# Patient Record
Sex: Female | Born: 1950 | Race: White | Hispanic: No | Marital: Married | Smoking: Former smoker
Health system: Southern US, Community
[De-identification: ages and names within clinical notes are randomized; demographics above are authoritative.]

## PROBLEM LIST (undated history)

## (undated) DIAGNOSIS — M8580 Other specified disorders of bone density and structure, unspecified site: Secondary | ICD-10-CM

## (undated) DIAGNOSIS — I10 Essential (primary) hypertension: Secondary | ICD-10-CM

## (undated) DIAGNOSIS — Z86010 Personal history of colonic polyps: Secondary | ICD-10-CM

## (undated) DIAGNOSIS — F419 Anxiety disorder, unspecified: Secondary | ICD-10-CM

## (undated) DIAGNOSIS — K6289 Other specified diseases of anus and rectum: Secondary | ICD-10-CM

## (undated) DIAGNOSIS — Z87442 Personal history of urinary calculi: Secondary | ICD-10-CM

## (undated) DIAGNOSIS — G4730 Sleep apnea, unspecified: Secondary | ICD-10-CM

## (undated) DIAGNOSIS — Z9989 Dependence on other enabling machines and devices: Secondary | ICD-10-CM

## (undated) DIAGNOSIS — G4719 Other hypersomnia: Secondary | ICD-10-CM

## (undated) DIAGNOSIS — G2581 Restless legs syndrome: Secondary | ICD-10-CM

## (undated) DIAGNOSIS — R011 Cardiac murmur, unspecified: Secondary | ICD-10-CM

## (undated) DIAGNOSIS — G4733 Obstructive sleep apnea (adult) (pediatric): Secondary | ICD-10-CM

## (undated) DIAGNOSIS — Z8659 Personal history of other mental and behavioral disorders: Secondary | ICD-10-CM

## (undated) HISTORY — DX: Essential (primary) hypertension: I10

## (undated) HISTORY — DX: Other hypersomnia: G47.19

## (undated) HISTORY — DX: Personal history of colonic polyps: Z86.010

## (undated) HISTORY — DX: Personal history of urinary calculi: Z87.442

## (undated) HISTORY — PX: OTHER SURGICAL HISTORY: SHX169

## (undated) HISTORY — DX: Other specified diseases of anus and rectum: K62.89

## (undated) HISTORY — PX: TONSILLECTOMY AND ADENOIDECTOMY: SHX28

## (undated) HISTORY — DX: Sleep apnea, unspecified: G47.30

---

## 2000-09-22 ENCOUNTER — Other Ambulatory Visit: Admission: RE | Admit: 2000-09-22 | Discharge: 2000-09-22 | Payer: Self-pay | Admitting: Obstetrics and Gynecology

## 2001-04-20 ENCOUNTER — Encounter: Payer: Self-pay | Admitting: Obstetrics and Gynecology

## 2001-04-20 ENCOUNTER — Ambulatory Visit (HOSPITAL_COMMUNITY): Admission: RE | Admit: 2001-04-20 | Discharge: 2001-04-20 | Payer: Self-pay | Admitting: Obstetrics and Gynecology

## 2001-05-18 ENCOUNTER — Encounter: Payer: Self-pay | Admitting: Obstetrics and Gynecology

## 2001-05-18 ENCOUNTER — Encounter: Admission: RE | Admit: 2001-05-18 | Discharge: 2001-05-18 | Payer: Self-pay | Admitting: Obstetrics and Gynecology

## 2001-09-23 ENCOUNTER — Other Ambulatory Visit: Admission: RE | Admit: 2001-09-23 | Discharge: 2001-09-23 | Payer: Self-pay | Admitting: Obstetrics and Gynecology

## 2001-12-02 ENCOUNTER — Encounter: Admission: RE | Admit: 2001-12-02 | Discharge: 2001-12-02 | Payer: Self-pay | Admitting: Urology

## 2001-12-02 ENCOUNTER — Encounter: Payer: Self-pay | Admitting: Urology

## 2001-12-12 ENCOUNTER — Encounter: Payer: Self-pay | Admitting: Urology

## 2001-12-12 ENCOUNTER — Encounter: Admission: RE | Admit: 2001-12-12 | Discharge: 2001-12-12 | Payer: Self-pay | Admitting: Urology

## 2001-12-13 ENCOUNTER — Ambulatory Visit (HOSPITAL_BASED_OUTPATIENT_CLINIC_OR_DEPARTMENT_OTHER): Admission: RE | Admit: 2001-12-13 | Discharge: 2001-12-13 | Payer: Self-pay | Admitting: Urology

## 2001-12-13 HISTORY — PX: OTHER SURGICAL HISTORY: SHX169

## 2002-11-08 ENCOUNTER — Ambulatory Visit (HOSPITAL_COMMUNITY): Admission: RE | Admit: 2002-11-08 | Discharge: 2002-11-08 | Payer: Self-pay | Admitting: Obstetrics and Gynecology

## 2002-11-08 ENCOUNTER — Encounter: Payer: Self-pay | Admitting: Obstetrics and Gynecology

## 2002-12-18 ENCOUNTER — Other Ambulatory Visit: Admission: RE | Admit: 2002-12-18 | Discharge: 2002-12-18 | Payer: Self-pay | Admitting: Obstetrics and Gynecology

## 2003-12-19 ENCOUNTER — Other Ambulatory Visit: Admission: RE | Admit: 2003-12-19 | Discharge: 2003-12-19 | Payer: Self-pay | Admitting: Obstetrics and Gynecology

## 2004-05-20 ENCOUNTER — Ambulatory Visit: Payer: Self-pay | Admitting: Internal Medicine

## 2004-08-14 ENCOUNTER — Ambulatory Visit: Payer: Self-pay | Admitting: Internal Medicine

## 2004-08-15 ENCOUNTER — Ambulatory Visit (HOSPITAL_COMMUNITY): Admission: RE | Admit: 2004-08-15 | Discharge: 2004-08-15 | Payer: Self-pay | Admitting: Obstetrics and Gynecology

## 2004-12-04 ENCOUNTER — Ambulatory Visit: Payer: Self-pay | Admitting: Internal Medicine

## 2005-01-07 ENCOUNTER — Ambulatory Visit: Payer: Self-pay | Admitting: Internal Medicine

## 2005-01-19 ENCOUNTER — Ambulatory Visit: Payer: Self-pay | Admitting: Internal Medicine

## 2005-02-04 ENCOUNTER — Ambulatory Visit: Payer: Self-pay | Admitting: Internal Medicine

## 2005-02-17 ENCOUNTER — Other Ambulatory Visit: Admission: RE | Admit: 2005-02-17 | Discharge: 2005-02-17 | Payer: Self-pay | Admitting: Obstetrics and Gynecology

## 2005-06-16 ENCOUNTER — Ambulatory Visit: Payer: Self-pay | Admitting: Internal Medicine

## 2005-09-29 ENCOUNTER — Ambulatory Visit: Payer: Self-pay | Admitting: Internal Medicine

## 2005-10-02 ENCOUNTER — Ambulatory Visit: Payer: Self-pay | Admitting: Internal Medicine

## 2005-10-26 ENCOUNTER — Ambulatory Visit (HOSPITAL_COMMUNITY): Admission: RE | Admit: 2005-10-26 | Discharge: 2005-10-26 | Payer: Self-pay | Admitting: Internal Medicine

## 2006-03-02 ENCOUNTER — Ambulatory Visit: Payer: Self-pay | Admitting: Internal Medicine

## 2006-03-09 ENCOUNTER — Ambulatory Visit: Payer: Self-pay | Admitting: Internal Medicine

## 2006-03-09 ENCOUNTER — Encounter: Payer: Self-pay | Admitting: Internal Medicine

## 2006-03-09 HISTORY — PX: COLONOSCOPY: SHX174

## 2006-03-12 ENCOUNTER — Other Ambulatory Visit: Admission: RE | Admit: 2006-03-12 | Discharge: 2006-03-12 | Payer: Self-pay | Admitting: Obstetrics and Gynecology

## 2006-08-23 ENCOUNTER — Ambulatory Visit (HOSPITAL_COMMUNITY): Admission: RE | Admit: 2006-08-23 | Discharge: 2006-08-23 | Payer: Self-pay | Admitting: Neurology

## 2006-12-09 ENCOUNTER — Encounter: Payer: Self-pay | Admitting: Internal Medicine

## 2006-12-21 ENCOUNTER — Ambulatory Visit: Payer: Self-pay | Admitting: Family Medicine

## 2007-04-19 ENCOUNTER — Ambulatory Visit (HOSPITAL_COMMUNITY): Admission: RE | Admit: 2007-04-19 | Discharge: 2007-04-19 | Payer: Self-pay | Admitting: Obstetrics and Gynecology

## 2007-11-14 LAB — CONVERTED CEMR LAB: Pap Smear: NORMAL

## 2007-11-29 ENCOUNTER — Other Ambulatory Visit: Admission: RE | Admit: 2007-11-29 | Discharge: 2007-11-29 | Payer: Self-pay | Admitting: Obstetrics and Gynecology

## 2008-04-24 ENCOUNTER — Ambulatory Visit (HOSPITAL_COMMUNITY): Admission: RE | Admit: 2008-04-24 | Discharge: 2008-04-24 | Payer: Self-pay | Admitting: Obstetrics and Gynecology

## 2008-08-21 ENCOUNTER — Ambulatory Visit: Payer: Self-pay | Admitting: Internal Medicine

## 2008-08-21 LAB — CONVERTED CEMR LAB
ALT: 18 units/L (ref 0–35)
Albumin: 3.8 g/dL (ref 3.5–5.2)
Bilirubin Urine: NEGATIVE
Blood in Urine, dipstick: NEGATIVE
Calcium: 9 mg/dL (ref 8.4–10.5)
Chloride: 109 meq/L (ref 96–112)
Cholesterol: 216 mg/dL (ref 0–200)
GFR calc Af Amer: 111 mL/min
Glucose, Bld: 94 mg/dL (ref 70–99)
HDL: 62.4 mg/dL (ref >39.0)
Lymphocytes Relative: 19.3 % (ref 12.0–46.0)
Monocytes Relative: 7.5 % (ref 3.0–12.0)
Neutro Abs: 3.9 10*3/uL (ref 1.4–7.7)
Nitrite: NEGATIVE
Platelets: 164 10*3/uL (ref 150–400)
RBC: 4.34 M/uL (ref 3.87–5.11)
TSH: 0.88 microintl units/mL (ref 0.35–5.50)
Total Bilirubin: 0.7 mg/dL (ref 0.3–1.2)
Total CHOL/HDL Ratio: 3.5
Urobilinogen, UA: 0.2

## 2008-08-28 ENCOUNTER — Ambulatory Visit: Payer: Self-pay | Admitting: Internal Medicine

## 2008-08-28 DIAGNOSIS — G2581 Restless legs syndrome: Secondary | ICD-10-CM | POA: Insufficient documentation

## 2008-08-28 DIAGNOSIS — Z87442 Personal history of urinary calculi: Secondary | ICD-10-CM | POA: Insufficient documentation

## 2008-08-28 DIAGNOSIS — E669 Obesity, unspecified: Secondary | ICD-10-CM | POA: Insufficient documentation

## 2009-01-28 ENCOUNTER — Encounter: Payer: Self-pay | Admitting: Obstetrics and Gynecology

## 2009-01-28 ENCOUNTER — Ambulatory Visit (HOSPITAL_COMMUNITY): Admission: RE | Admit: 2009-01-28 | Discharge: 2009-01-28 | Payer: Self-pay | Admitting: Obstetrics and Gynecology

## 2009-01-28 HISTORY — PX: OTHER SURGICAL HISTORY: SHX169

## 2009-06-11 ENCOUNTER — Ambulatory Visit: Payer: Self-pay | Admitting: Family Medicine

## 2009-06-11 DIAGNOSIS — N390 Urinary tract infection, site not specified: Secondary | ICD-10-CM | POA: Insufficient documentation

## 2009-06-11 LAB — CONVERTED CEMR LAB
Bilirubin Urine: NEGATIVE
Blood in Urine, dipstick: NEGATIVE
Ketones, urine, test strip: NEGATIVE
Nitrite: NEGATIVE
Protein, U semiquant: NEGATIVE
Specific Gravity, Urine: <1.005
WBC Urine, dipstick: NEGATIVE

## 2009-06-12 ENCOUNTER — Encounter: Payer: Self-pay | Admitting: Family Medicine

## 2009-07-26 ENCOUNTER — Encounter: Payer: Self-pay | Admitting: Internal Medicine

## 2009-08-30 ENCOUNTER — Ambulatory Visit: Payer: Self-pay | Admitting: Internal Medicine

## 2009-08-30 DIAGNOSIS — Z8679 Personal history of other diseases of the circulatory system: Secondary | ICD-10-CM | POA: Insufficient documentation

## 2009-08-30 DIAGNOSIS — R0602 Shortness of breath: Secondary | ICD-10-CM | POA: Insufficient documentation

## 2009-09-16 ENCOUNTER — Ambulatory Visit: Payer: Self-pay

## 2009-09-16 ENCOUNTER — Encounter: Payer: Self-pay | Admitting: Internal Medicine

## 2009-09-16 ENCOUNTER — Ambulatory Visit (HOSPITAL_COMMUNITY): Admission: RE | Admit: 2009-09-16 | Discharge: 2009-09-16 | Payer: Self-pay | Admitting: Internal Medicine

## 2009-09-16 ENCOUNTER — Ambulatory Visit: Payer: Self-pay | Admitting: Internal Medicine

## 2009-09-16 HISTORY — PX: TRANSTHORACIC ECHOCARDIOGRAM: SHX275

## 2009-09-18 ENCOUNTER — Ambulatory Visit: Payer: Self-pay | Admitting: Internal Medicine

## 2009-09-18 ENCOUNTER — Ambulatory Visit: Payer: Self-pay | Admitting: Cardiovascular Disease

## 2009-09-18 ENCOUNTER — Telehealth (INDEPENDENT_AMBULATORY_CARE_PROVIDER_SITE_OTHER): Payer: Self-pay | Admitting: *Deleted

## 2009-09-19 ENCOUNTER — Encounter (HOSPITAL_COMMUNITY): Admission: RE | Admit: 2009-09-19 | Discharge: 2009-11-14 | Payer: Self-pay | Admitting: Cardiovascular Disease

## 2009-09-19 ENCOUNTER — Ambulatory Visit: Payer: Self-pay

## 2009-09-19 ENCOUNTER — Ambulatory Visit: Payer: Self-pay | Admitting: Cardiology

## 2009-09-19 HISTORY — PX: CARDIOVASCULAR STRESS TEST: SHX262

## 2009-09-23 ENCOUNTER — Ambulatory Visit (HOSPITAL_COMMUNITY): Admission: RE | Admit: 2009-09-23 | Discharge: 2009-09-23 | Payer: Self-pay | Admitting: Obstetrics and Gynecology

## 2009-09-23 HISTORY — PX: OTHER SURGICAL HISTORY: SHX169

## 2009-09-23 LAB — CONVERTED CEMR LAB
ALT: 18 units/L (ref 0–35)
Basophils Relative: 0.4 % (ref 0.0–3.0)
CO2: 26 meq/L (ref 19–32)
Chloride: 111 meq/L (ref 96–112)
Creatinine, Ser: 0.8 mg/dL (ref 0.4–1.2)
Direct LDL: 138.8 mg/dL
Eosinophils Absolute: 0.2 10*3/uL (ref 0.0–0.7)
GFR calc non Af Amer: 78.08 mL/min (ref >60)
Glucose, Bld: 102 mg/dL — ABNORMAL HIGH (ref 70–99)
HCT: 42.1 % (ref 36.0–46.0)
Lymphocytes Relative: 17.9 % (ref 12.0–46.0)
Lymphs Abs: 1.1 10*3/uL (ref 0.7–4.0)
MCHC: 34.7 g/dL (ref 30.0–36.0)
MCV: 94.1 fL (ref 78.0–100.0)
Monocytes Absolute: 0.5 10*3/uL (ref 0.1–1.0)
Platelets: 176 10*3/uL (ref 150.0–400.0)
Sodium: 142 meq/L (ref 135–145)
TSH: 1.14 microintl units/mL (ref 0.35–5.50)
Total Protein: 6.8 g/dL (ref 6.0–8.3)

## 2009-12-20 ENCOUNTER — Ambulatory Visit: Payer: Self-pay | Admitting: Internal Medicine

## 2009-12-20 DIAGNOSIS — R490 Dysphonia: Secondary | ICD-10-CM | POA: Insufficient documentation

## 2009-12-31 ENCOUNTER — Encounter: Payer: Self-pay | Admitting: Internal Medicine

## 2010-01-01 ENCOUNTER — Ambulatory Visit (HOSPITAL_COMMUNITY): Admission: RE | Admit: 2010-01-01 | Discharge: 2010-01-01 | Payer: Self-pay | Admitting: Obstetrics and Gynecology

## 2010-01-06 ENCOUNTER — Encounter: Admission: RE | Admit: 2010-01-06 | Discharge: 2010-01-06 | Payer: Self-pay | Admitting: Obstetrics and Gynecology

## 2010-05-06 ENCOUNTER — Encounter: Payer: Self-pay | Admitting: Internal Medicine

## 2010-06-12 ENCOUNTER — Encounter
Admission: RE | Admit: 2010-06-12 | Discharge: 2010-06-12 | Payer: Self-pay | Source: Home / Self Care | Attending: Otolaryngology | Admitting: Otolaryngology

## 2010-07-15 NOTE — Consult Note (Signed)
Summary: [facility]  [facility]   Imported By: Maryln Gottron 01/07/2010 15:53:08  _____________________________________________________________________  External Attachment:    Type:   Image     Comment:   External Document

## 2010-07-15 NOTE — Assessment & Plan Note (Signed)
Summary: np3/DOE/jml   Primary Provider:  Dr. Fabian Sharp  CC:  referal from Dr Fabian Sharp due to sob.  History of Present Illness: Amy Burnett is seen today at the request of Dr Fabian Sharp and Romine.  She is to have uterine polyp surgery on Monday and needs clearance.  She has had progressive exertinal dyspnea over th last year.  I reviewed her recent echo and LV/RV function normal She has a history of MVP but there was no significant prolapse on echol  He dyspnea is exertional, and persistant with some diaphoresis.  She quit smoking over 20 years ago and has no chronic lung disease.  She denies cough, fever, sputum.  She has had a little LE edema.  Her weight has gone up a lot over the last year.  She has not had a previous stress test but had a calcium score of 0 done in [place] in 2001.  Her ECG is abnormal with nonspecific ST/T wave changes and poor R wave progression  Current Problems (verified): 1)  Mitral Valve Prolapse, Hx of  (ICD-V12.50) 2)  Shortness of Breath  (ICD-786.05) 3)  Uti  (ICD-599.0) 4)  Restless Leg Syndrome  (ICD-333.94) 5)  Obesity, Unspecified  (ICD-278.00) 6)  Nephrolithiasis, Hx of  (ICD-V13.01) 7)  Routine Gynecological Exam  (ICD-V72.31) 8)  Routine General Medical Exam@[facility]  (ICD-V70.0) 9)  Family History Breast Cancer 1st Degree Relative <50  (ICD-V16.3)  Current Medications (verified): 1)  Requip 1 Mg Tabs (Ropinirole Hcl) .Marland Kitchen.. 1 By Mouth Qid 2)  Zoloft 100 Mg  Tabs (Sertraline Hcl) .... 2 By Mouth Once Daily 3)  Prometrium 100 Mg Caps (Progesterone Micronized) .Marland Kitchen.. 1 By Mouth Once Daily 4)  Alprazolam 0.5 Mg Tabs (Alprazolam) .Marland Kitchen.. 1 By Mouth As Needed 5)  Estradiol 0.05 Mg/24hr Ptwk (Estradiol) .... As Directed 6)  Simply Sleep .... At Bedtime 7)  Vitamin D (Ergocalciferol) 50000 Unit Caps (Ergocalciferol) .... Once Weekly  Allergies (verified): No Known Drug Allergies  Past History:  Past Medical History: Last updated: 09/16/2009 Current  Problems:  MITRAL VALVE PROLAPSE, HX OF (ICD-V12.50) trivial MR by echo 2011 SHORTNESS OF BREATH (ICD-786.05) UTI (ICD-599.0) RESTLESS LEG SYNDROME (ICD-333.94) OBESITY, UNSPECIFIED (ICD-278.00) NEPHROLITHIASIS, HX OF (ICD-V13.01), hx of calcium stone  ROUTINE GYNECOLOGICAL EXAM (ICD-V72.31) ROUTINE GENERAL MEDICAL EXAM@[facility]ARE [facility] (ICD-V70.0) FAMILY HISTORY BREAST CANCER 1ST DEGREE RELATIVE <50 (ICD-V16.3)  MVP Panic Disorders  RLS  Past Surgical History: Last updated: 08/30/2009 Colonoscopy Cystoscopy  2010  Family History: Last updated: 08/30/2009 Family History Breast cancer 1st degree relative <50 Family History of Cardiovascular disorder Father: died Hd and alcohol   RHD    age 28s  Mother: Died age 37 pancreatic cancer   had breast cancer also when 31s  Siblings: 1 sister  good health . neg for SCD.   Social History: Last updated: 08/28/2008 Married Former Smoker Alcohol use-yes Psychologist, sport and exercise  . Sleep  7 hours  HH of    2 +  Pets 2 cats    Review of Systems       Denies fever, malais, weight loss, blurry vision, decreased visual acuity, cough, sputum, , hemoptysis, pleuritic pain, palpitaitons, heartburn, abdominal pain, melena, lower extremity edema, claudication, or rash.   Vital Signs:  Patient profile:   60 year old female Menstrual status:  irregular Height:      66 inches Weight:      221 pounds BMI:     35.80 Pulse rate:   80 / minute Resp:  14 per minute BP sitting:   118 / 90  (left arm)  Vitals Entered By: Kem Parkinson (September 18, 2009 2:17 PM)  Physical Exam  General:  Affect appropriate Healthy:  appears stated age HEENT: normal Neck supple with no adenopathy JVP normal no bruits no thyromegaly Lungs clear with no wheezing and good diaphragmatic motion Heart:  S1/S2 no murmur,rub, gallop or click PMI normal Abdomen: benighn, BS positve, no tenderness, no AAA no bruit.  No HSM or HJR Distal pulses intact with no  bruits No edema Neuro non-focal Skin warm and dry    Impression & Recommendations:  Problem # 1:  MITRAL VALVE PROLAPSE, HX OF (ICD-V12.50) No significant prolapse in echo  Problem # 2:  SHORTNESS OF BREATH (ICD-786.05) Echo normal.  ETT myovue to R/O anginal equivalent and clear for ObGyn surgery Orders: Nuclear Stress Test (Nuc Stress Test)  Problem # 3:  OBESITY, UNSPECIFIED (ICD-278.00) If ETT normal clear to start exercise program.  Discussed low carb diet  Patient Instructions: 1)  Your physician recommends that you schedule a follow-up appointment in: AS NEEDED PENDING TEST RESULTS 2)  Your physician has requested that you have an exercise stress myoview.  For further information please visit [email or web address]  Please follow instruction sheet, as given.   EKG Report  Procedure date:  08/30/2009  Findings:      NSR 79 Poor R wave progression Nonspecific ST/T wave changes

## 2010-07-15 NOTE — Letter (Signed)
Summary: [facility] neg cysto   [facility]   Imported By: Maryln Gottron 08/02/2009 14:56:17  _____________________________________________________________________  External Attachment:    Type:   Image     Comment:   External Document

## 2010-07-15 NOTE — Progress Notes (Signed)
Summary: Nuclear Pre-Procedure  Phone Note Outgoing Call   Action Taken: Appt scheduled Summary of Call: Reviewed information on Myoview Information Sheet while scheduling appointmet(see scanned document for further details).  Spoke with Patient    Nuclear Med Background Indications for Stress Test: Evaluation for Ischemia, Surgical Clearance, Abnormal EKG  Indications Comments: Pending uterine polyp surgery (09/23/09)  History: Echo, MVP  History Comments: 4/11 Echo: EF=60-65%  Symptoms: Diaphoresis, DOE, SOB    Nuclear Pre-Procedure Cardiac Risk Factors: Family History - CAD, History of Smoking Height (in): 66

## 2010-07-15 NOTE — Assessment & Plan Note (Signed)
Summary: voice change/dm   Vital Signs:  Patient profile:   60 year old female Menstrual status:  irregular Height:      66.5 inches (168.91 cm) Weight:      225.31 pounds (102.41 kg) O2 Sat:      94 % on Room air Temp:     98.3 degrees F (36.83 degrees C) oral Pulse rate:   87 / minute BP sitting:   118 / 76  (left arm) Cuff size:   large  Vitals Entered By: Josph Macho RMA (December 20, 2009 8:16 AM)  O2 Flow:  Room air CC: voice change X1 month- pt states she feels like she has a sore throat sometimes/ CF Is Patient Diabetic? No   History of Present Illness: Amy Burnett comes in today  for NDA appt for above problem . Month long problem  ? from talking a lot.  Feels like has to strain a bit and then off.  weakness of voice is described .   no fever , allergies  and no gerd signs . Does have nose congestion . ocass ST.   No aggravator or mitigators .    uses neosynephrine at night .    for nasal stuffiness for a while.    No coughing or allergy dx .   no dysphagia.  Preventive Screening-Counseling & Management  Alcohol-Tobacco     Alcohol drinks/day: 2     Alcohol type: wine     Smoking Status: quit     Year Quit: 1987  Caffeine-Diet-Exercise     Caffeine use/day: 4     Does Patient Exercise: no     Exercise (avg: min/session): 6:30  Current Medications (verified): 1)  Requip 1 Mg Tabs (Ropinirole Hcl) .Marland Kitchen.. 1 By Mouth Qid 2)  Zoloft 100 Mg  Tabs (Sertraline Hcl) .... 2 By Mouth Once Daily 3)  Prometrium 100 Mg Caps (Progesterone Micronized) .Marland Kitchen.. 1 By Mouth Once Daily 4)  Alprazolam 0.5 Mg Tabs (Alprazolam) .Marland Kitchen.. 1 By Mouth As Needed 5)  Estradiol 0.05 Mg/24hr Ptwk (Estradiol) .... As Directed 6)  Simply Sleep .... At Bedtime 7)  Vitamin D (Ergocalciferol) 50000 Unit Caps (Ergocalciferol) .... Once Weekly  Allergies (verified): No Known Drug Allergies  Past History:  Past medical, surgical, family and social histories (including risk factors) reviewed, and no  changes noted (except as noted below).  Past Medical History: Current Problems:  MITRAL VALVE PROLAPSE, HX OF (ICD-V12.50) trivial MR by echo 2011   SHORTNESS OF BREATH (ICD-786.05) UTI (ICD-599.0) RESTLESS LEG SYNDROME (ICD-333.94) OBESITY, UNSPECIFIED (ICD-278.00) NEPHROLITHIASIS, HX OF (ICD-V13.01), hx of calcium stone  stress echo neg . MVP Panic Disorders  RLS  Past Surgical History: Colonoscopy Cystoscopy  2010 Gyne surgery   Past History:  Care Management: Gynecology: Dr. Tresa Res Podiatry: Dr. Stacie Acres Neurology: Dr. Vickey Huger  RLS   Gastroenterology: Leone Payor  Urology 2011    Family History: Reviewed history from 08/30/2009 and no changes required. Family History Breast cancer 1st degree relative <50 Family History of Cardiovascular disorder Father: died Hd and alcohol   RHD    age 58s  Mother: Died age 37 pancreatic cancer   had breast cancer also when 53s  Siblings: 1 sister  good health . neg for SCD.   Social History: Reviewed history from 08/28/2008 and no changes required. 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    Physical Exam  General:  alert, well-developed, and well-nourished.   Head:  normocephalic and atraumatic.   Eyes:  vision grossly intact, pupils equal, and pupils round.   Ears:  R ear normal, L ear normal, and no external deformities.   Nose:  no external deformity, no external erythema, and no nasal discharge.   Mouth:  pharynx pink and moist.   Neck:  No deformities, masses, or tenderness noted. Lungs:  Normal respiratory  effort, chest expands symmetrically. Lungs are clear to auscultation, no crackles or wheezes. Heart:  normal rate, regular rhythm, and no murmur.   Pulses:  pulses intact without delay   Extremities:  no clubbing cyanosis or edema  Neurologic:  non focal  Skin:  turgor normal, color normal, no ecchymoses, and no petechiae.   Cervical Nodes:  No lymphadenopathy noted Psych:  Oriented X3, normally interactive, good eye contact, not anxious appearing, and not depressed appearing.     Impression & Recommendations:  Problem # 1:  HOARSENESS (ZOX-096.04)  voice change   for over a month with neg    ros  ? cause  ... diff dx explained  .    need ent check of airway etc.    Orders: ENT Referral (ENT)  Complete Medication List: 1)  Requip 1 Mg Tabs (Ropinirole hcl) .Marland Kitchen.. 1 by mouth qid 2)  Zoloft 100 Mg Tabs (Sertraline hcl) .... 2 by mouth once daily 3)  Prometrium 100 Mg Caps (Progesterone micronized) .Marland Kitchen.. 1 by mouth once daily 4)  Alprazolam 0.5 Mg Tabs (Alprazolam) .Marland Kitchen.. 1 by mouth as needed 5)  Estradiol 0.05 Mg/24hr Ptwk (Estradiol) .... As directed 6)  Simply Sleep  .... At bedtime 7)  Vitamin D (ergocalciferol) 50000 Unit Caps (Ergocalciferol) .... Once weekly  Patient Instructions: 1)  Will contact you about ENT  2)  IN the meantime can try OTC claritin or allegra or zyrtec  to see if makes a different.  3)  This could be silent reflux also.

## 2010-07-15 NOTE — Assessment & Plan Note (Signed)
Summary: Cardiology Nuclear Study  Nuclear Med Background Indications for Stress Test: Evaluation for Ischemia, Surgical Clearance, Abnormal EKG  Indications Comments: Pending uterine polyp surgery (09/23/09)  History: Echo, MVP  History Comments: 4/11 Echo: EF=60-65%  Symptoms: Diaphoresis, DOE, SOB    Nuclear Pre-Procedure Cardiac Risk Factors: Family History - CAD, History of Smoking Caffeine/Decaff Intake: none NPO After: 8:30 PM Lungs: clear IV 0.9% NS with Angio Cath: 20g     IV Site: (R) AC IV Started by: Stanton Kidney EMT-P Chest Size (in) 40     Cup Size DDD     Height (in): 66.5 Weight (lb): 220 BMI: 35.10  Nuclear Med Study 1 or 2 day study:  1 day     Stress Test Type:  Stress Reading MD:  Marca Ancona, MD     Referring MD:  P.Nishan Resting Radionuclide:  Technetium 3m Tetrofosmin     Resting Radionuclide Dose:  11 mCi  Stress Radionuclide:  Technetium 22m Tetrofosmin     Stress Radionuclide Dose:  33 mCi   Stress Protocol Exercise Time (min):  6:30 min     Max HR:  150 bpm     Predicted Max HR:  162 bpm  Max Systolic BP: 141 mm Hg     Percent Max HR:  92.59 %     METS: 7.7 Rate Pressure Product:  16109    Stress Test Technologist:  Milana Na EMT-P     Nuclear Technologist:  Domenic Polite CNMT  Rest Procedure  Myocardial perfusion imaging was performed at rest 45 minutes following the intravenous administration of Myoview Technetium 34m Tetrofosmin.  Stress Procedure  The patient exercised for 6:30. The patient stopped due to fatigue and denied any chest pain.  There were no significant ST-T wave changes.  Myoview was injected at peak exercise and myocardial perfusion imaging was performed after a brief delay.  QPS Raw Data Images:  Normal; no motion artifact; normal heart/lung ratio. Stress Images:  NI: Uniform and normal uptake of tracer in all myocardial segments. Rest Images:  Normal homogeneous uptake in all areas of the  myocardium. Subtraction (SDS):  There is no evidence of scar or ischemia. Transient Ischemic Dilatation:  1.13  (Normal <1.22)  Lung/Heart Ratio:  .39  (Normal <0.45)  Quantitative Gated Spect Images QGS EDV:  67 ml QGS ESV:  13 ml QGS EF:  80 % QGS cine images:  Normal wall motion.    Overall Impression  Exercise Capacity: Fair exercise capacity. BP Response: Normal blood pressure response. Clinical Symptoms: Stopped due to fatigue.  ECG Impression: No significant ST segment change suggestive of ischemia. Overall Impression: Normal stress nuclear study.  Appended Document: Cardiology Nuclear Study normal nuclear.  Ok for Ob/Gyn surgery  Appended Document: Cardiology Nuclear Study pt aware of results

## 2010-07-15 NOTE — Assessment & Plan Note (Signed)
Summary: RESP CONCERNS (PT ADV MAY BE STRESS / FATIGUE?) // RS   Vital Signs:  Patient profile:   60 year old female Menstrual status:  irregular Height:      66 inches Weight:      221 pounds BMI:     35.80 O2 Sat:      95 % Temp:     98 degrees F oral Pulse rate:   81 / minute Pulse rhythm:   regular Resp:     16 per minute BP sitting:   106 / 66  Vitals Entered By: Lynann Beaver CMA (August 30, 2009 11:41 AM) CC: Pt is complaining of SOB with exertion Is Patient Diabetic? No Pain Assessment Patient in pain? no        History of Present Illness: Amy Burnett comes  in today for    acute visit.  She is concerned  because of the   insidious onset of" windednesss when does things".  Seems deconditioning  but unsure if could have other problems.   Worried about heart disease. Began walking and want to make sure she is ok.  Was hard to talk  when walking with sister.   But had no chest pain.   No leg swelling or redness . Could have anxiety  about this in the mix. Would like to start exercising more but has some concern about this. Since her last visit here  Has had fibroid removed in August.   and to have polyp  to be taken out.   soon.  She saw urology for symptom that were not from infection and has had a neg eval so far . Is trying Detrol.  Preventive Screening-Counseling & Management  Alcohol-Tobacco     Alcohol drinks/day: 2     Alcohol type: wine     Smoking Status: quit     Year Quit: 1987  Caffeine-Diet-Exercise     Caffeine use/day: 4     Does Patient Exercise: no  Allergies: No Known Drug Allergies  Past History:  Past medical, surgical, family and social histories (including risk factors) reviewed, and no changes noted (except as noted below).  Past Medical History: MVP Panic Disorders Nephrolithiasis, hx of calcium stone  RLS  Past Surgical History: Colonoscopy Cystoscopy  2010  Past History:  Care Management: Gynecology: Dr.  Tresa Res Podiatry: Dr. Stacie Acres Neurology: Dr. Vickey Huger  RLS   Gastroenterology: Leone Payor  Urology 2011  Family History: Reviewed history from 08/28/2008 and no changes required. Family History Breast cancer 1st degree relative <50 Family History of Cardiovascular disorder Father: died Hd and alcohol   RHD    age 63s  Mother: Died age 22 pancreatic cancer   had breast cancer also when 42s  Siblings: 1 sister  good health . neg for SCD.   Social History: Reviewed history from 08/28/2008 and no changes required. 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         on detrol for UI, has gained weight over last year. , anxiety under rx .  Physical Exam  General:  Well-developed,well-nourished,in no acute distress; alert,appropriate and cooperative throughout examination Head:  normocephalic and atraumatic.   Eyes:  vision grossly intact, pupils equal, pupils round, and pupils reactive to light.   Ears:  R ear normal and L ear normal.   Mouth:  pharynx pink and moist and no erythema.   Neck:  No deformities, masses, or tenderness noted. Lungs:  Normal respiratory effort, chest expands symmetrically. Lungs are clear to auscultation, no crackles or wheezes.no dullness.   Heart:  Normal rate and regular rhythm. S1 and S2 normal without gallop, murmur, click, rub or other extra sounds.no lifts.   Abdomen:  Bowel sounds positive,abdomen soft and non-tender without masses, organomegaly or   noted. Msk:  no joint warmth and no redness over joints.   Pulses:  pulses intact without delay    Extremities:  no clubbing cyanosis or edema  Neurologic:  alert & oriented X3, strength normal in all extremities, and gait normal.   Skin:  turgor normal, color normal, no ecchymoses, and no petechiae.   Cervical Nodes:  No lymphadenopathy noted Psych:  Oriented X3, normally interactive, good eye contact, not anxious appearing, and not depressed appearing.   EKG  sinus rhythm poor r wave program progession.    Impression & Recommendations:  Problem # 1:  SHORTNESS OF BREATH (ICD-786.05)    DOE non acute .   r/o anemia metabolic  problem  could be deconditioning but rec   further evaluation as she is concerned.      counseled about this.  Hx of MVP but i dont hear sig MR  on exam  Orders: EKG w/ Interpretation (93000) T-2 View CXR (71020TC) Cardiology Referral (Cardiology)  Problem # 2:  OBESITY, UNSPECIFIED (ICD-278.00) Assessment: Comment Only  Problem # 3:  MITRAL VALVE PROLAPSE, HX OF (ICD-V12.50)  Orders: Cardiology Referral (Cardiology)  Complete Medication List: 1)  Requip 1 Mg Tabs (Ropinirole hcl) .Marland Kitchen.. 1 by mouth qid 2)  Zoloft 100 Mg Tabs (Sertraline hcl) .... 2 by mouth once daily 3)  Prometrium 100 Mg Caps (Progesterone micronized) .Marland Kitchen.. 1 by mouth once daily 4)  Alprazolam 0.5 Mg Tabs (Alprazolam) .Marland Kitchen.. 1 by mouth as needed 5)  Estradiol 0.05 Mg/24hr Ptwk (Estradiol) 6)  Simply Sleep  7)  Vitamin D (ergocalciferol) 50000 Unit Caps (Ergocalciferol) .... Once weekly 8)  Detrol La 2 Mg Xr24h-cap (Tolterodine tartrate) .... One by mouth daily  Patient Instructions: 1)  Schedule for fasting labs  2)  LIPIDs, LFTS, TSH Free T4 , CBCdiff  , BMP ,  3)  Dx Dyspnea . 4)  Get chest x ray  5)  Will order  echo and cardiology opinion   .  6)  You will be informed of lab results when available.

## 2010-07-15 NOTE — Letter (Signed)
Summary: [facility]  [facility]   Imported By: Maryln Gottron 05/15/2010 15:30:52  _____________________________________________________________________  External Attachment:    Type:   Image     Comment:   External Document

## 2010-09-20 LAB — CBC
HCT: 41.4 % (ref 36.0–46.0)
Hemoglobin: 14.4 g/dL (ref 12.0–15.0)
MCHC: 34.7 g/dL (ref 30.0–36.0)
Platelets: 167 10*3/uL (ref 150–400)
RBC: 4.33 MIL/uL (ref 3.87–5.11)

## 2010-10-28 NOTE — Op Note (Signed)
NAMESHEMEKA, Amy Burnett                 ACCOUNT NO.:  [id number]   MEDICAL RECORD NO.:  [id number]          PATIENT TYPE:  AMB   LOCATION:  SDC                           FACILITY:  [facility]   PHYSICIAN:  Cynthia P. Romine, M.D.DATE OF BIRTH:  05/19/1951   DATE OF PROCEDURE:  01/28/2009  DATE OF DISCHARGE:                               OPERATIVE REPORT   PREOPERATIVE DIAGNOSES:  Postmenopausal bleeding, large submucous myoma.   POSTOPERATIVE DIAGNOSES:  Postmenopausal bleeding, large submucous  myoma, pathology pending.   PROCEDURE:  Hysteroscopic resection of myoma, dilation and curettage.   SURGEON:  Cynthia P. Romine, MD   ANESTHESIA:  General by LMA.   ESTIMATED BLOOD LOSS:  Minimal.   COMPLICATIONS:  None.   SORBITOL DEFICIT:  220 mL.   DESCRIPTION OF PROCEDURE:  The patient was taken to the operating room  and after induction of adequate general anesthesia, she was placed in  dorsal lithotomy position and prepped and draped in usual fashion.  The  bladder was drained with a red rubber catheter.  Paracervical block was  instituted by injecting 10 mL of 1% Xylocaine at each of 3 and 9  o'clock.  The uterus then sounded to 8 cm.  The cervix was dilated to  #31 Shawnie Pons.  The operative hysteroscope was introduced.  Sorbitol was  used as a distention medium.  The pressure on the pump was set at 80  mmHg.  Hysteroscopy revealed there to be a large submucous myoma  approximately 3 cm that arose from the posterior mid body of the uterus.  Single loop cautery was used to excise the myoma and multiple passes, it  was a very large myoma, when the sorbitol deficit was felt to be  approximately 300 mL and the myoma was almost completely resected, the  procedure was stopped.  There was a rather deep cavity in the  endometrium that had been created as the myoma was removed and even  though there may have any small amount of myoma left in the myometrium.  It was felt better to leave a very small  amount of myoma in the  myometrium then to risk of perforation.  Photographic documentation was  taken both before and after the resection.  The scope was withdrawn.  A  gentle sharp D and C was done.  Specimen sent to Pathology separately.  The instruments removed from vagina and the procedure was terminated.  The patient tolerated it well and went in satisfactory condition to post  anesthesia recovery.      Cynthia P. Romine, M.D.  Electronically Signed    CPR/MEDQ  D:  01/28/2009  T:  01/28/2009  Job:  [id number]

## 2010-10-31 NOTE — Op Note (Signed)
[facility]  Patient:    Amy Burnett, ALIA Visit Number: [id number] MRN: [id number]          Service Type: NES Location: [facility] Attending Physician:  Londell Moh Dictated by:   Jamison Neighbor, M.D. Proc. Date: 12/13/01 Admit Date:  12/13/2001 Discharge Date: 12/13/2001   CC:         Gordy Savers, M.D., [facility]   Operative Report  PREOPERATIVE DIAGNOSIS:  Distal left ureteral calculus.  POSTOPERATIVE DIAGNOSIS:  Distal left ureteral calculus.  OPERATION: 1. Cystoscopy. 2. Left retrograde. 3. Left ureteroscopy with in situ Holmium laser lithotripsy, basket    extraction of stone fragments, and left double-J catheter insertion.  SURGEON:  Jamison Neighbor, M.D.  ANESTHESIA:  General.  COMPLICATIONS:  None.  DRAIN:  6-French x 26 cm double-J catheter.  BRIEF HISTORY:  This 60 year old female with left-sided flank pain and hematuria.  She was found to have a stone in her distal left ureter that is too large to pass.  The patient is to undergo holmium laser lithotripsy.  The patient understands the risks and benefits of the procedure and gave full and informed consent.  DESCRIPTION OF PROCEDURE:  After successful induction of general anesthesia, the patient was placed in the dorsolithotomy position, prepped with Betadine, and draped in the usual sterile fashion.  Cystoscopy was performed.  The bladder was carefully inspected.  No mucosal masses could be seen.  There was a large fibroid that had been previously noted on the patients CT scan pushing up against the trigone and does in some way distort the appearance of the ureteral orifices.  The posterior orifice was cannulated.  Retrograde study was performed.  The intramural tunnel had normal caliber.  Just proximal to that point, a large stone could be seen, and there was hydronephrosis above the level of the stone.  A guidewire was passed up to the kidney.  The distal ureter was  dilated with a balloon dilator.  The guidewire was left in place. The cystoscope and balloon dilator were removed.  The ureteroscope was then advanced.  The stone could be seen.  A basket was passed around it, but the stone was too large to pull through; it caught up at the opening to the intramural tunnel.  The panel for the stone basket was cut, and the ureteroscope was removed.  It was then reinserted alongside the wire.  The holmium laser was used to fragment the stone.  The cut basket was then extracted.  Some stone pieces came out.  A new basket was obtained and was used to withdraw all the remaining pieces.  Everything was flushed from the ureter.  The ureter was then inspected all the way up to the UPJ.  There was no evidence of any additional stone material.  There was minimal mucosal injury to that area but enough that it was felt that a stent should be left in place.  The ureteroscope was removed.  The cystoscope was backloaded over the wire.  Double-J catheter was passed over the wire and up to the kidney using fluoroscopic and cystoscopic control.  It also coiled normally in the renal pelvis as well as with the bladder.  The bladder was drained.   A B & O suppository was inserted.  The patient was given Toradol and  Zofran intraoperatively.  She will be sent home with Lorcet Plus, Pyridium Plus, and Macrodantin which she will stay on until the stent is removed in approximately  7 to 10 days. Dictated by:   Jamison Neighbor, M.D. Attending Physician:  Londell Moh DD:  12/13/01 TD:  12/15/01 Job: [id number]

## 2011-02-02 ENCOUNTER — Other Ambulatory Visit: Payer: Self-pay | Admitting: Obstetrics and Gynecology

## 2011-02-02 DIAGNOSIS — Z1231 Encounter for screening mammogram for malignant neoplasm of breast: Secondary | ICD-10-CM

## 2011-02-06 ENCOUNTER — Ambulatory Visit (HOSPITAL_COMMUNITY)
Admission: RE | Admit: 2011-02-06 | Discharge: 2011-02-06 | Disposition: A | Discharge status: Home / Self Care | Payer: BC Managed Care – PPO | Source: Ambulatory Visit | Attending: Obstetrics and Gynecology | Admitting: Obstetrics and Gynecology

## 2011-02-06 DIAGNOSIS — Z1231 Encounter for screening mammogram for malignant neoplasm of breast: Secondary | ICD-10-CM | POA: Insufficient documentation

## 2011-04-08 ENCOUNTER — Telehealth: Payer: Self-pay | Admitting: *Deleted

## 2011-04-08 NOTE — Telephone Encounter (Signed)
Pt is requesting a script for shingles vaccine

## 2011-04-08 NOTE — Telephone Encounter (Signed)
Ok to do

## 2011-04-08 NOTE — Telephone Encounter (Signed)
Done

## 2011-04-09 MED ORDER — ZOSTER VACCINE LIVE 19400 UNT/0.65ML ~~LOC~~ SOLR: 0.6500 mL | Freq: Once | SUBCUTANEOUS | Status: AC

## 2011-04-09 NOTE — Telephone Encounter (Signed)
Addended by: Azucena Freed on: 04/09/2011 10:51 AM   Modules accepted: Orders

## 2011-04-09 NOTE — Telephone Encounter (Signed)
rx sent to pharmacy

## 2011-09-04 ENCOUNTER — Encounter (HOSPITAL_BASED_OUTPATIENT_CLINIC_OR_DEPARTMENT_OTHER): Payer: Self-pay | Admitting: Family Medicine

## 2011-09-04 ENCOUNTER — Emergency Department (HOSPITAL_BASED_OUTPATIENT_CLINIC_OR_DEPARTMENT_OTHER)
Admission: EM | Admit: 2011-09-04 | Discharge: 2011-09-04 | Disposition: A | Discharge status: Home / Self Care | Payer: BC Managed Care – PPO | Attending: Emergency Medicine | Admitting: Emergency Medicine

## 2011-09-04 DIAGNOSIS — F411 Generalized anxiety disorder: Secondary | ICD-10-CM | POA: Insufficient documentation

## 2011-09-04 DIAGNOSIS — S0003XA Contusion of scalp, initial encounter: Secondary | ICD-10-CM | POA: Insufficient documentation

## 2011-09-04 DIAGNOSIS — S0990XA Unspecified injury of head, initial encounter: Secondary | ICD-10-CM

## 2011-09-04 DIAGNOSIS — G2581 Restless legs syndrome: Secondary | ICD-10-CM | POA: Insufficient documentation

## 2011-09-04 DIAGNOSIS — Y9229 Other specified public building as the place of occurrence of the external cause: Secondary | ICD-10-CM | POA: Insufficient documentation

## 2011-09-04 DIAGNOSIS — W208XXA Other cause of strike by thrown, projected or falling object, initial encounter: Secondary | ICD-10-CM | POA: Insufficient documentation

## 2011-09-04 DIAGNOSIS — S0083XA Contusion of other part of head, initial encounter: Secondary | ICD-10-CM | POA: Insufficient documentation

## 2011-09-04 HISTORY — DX: Anxiety disorder, unspecified: F41.9
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
## 2011-09-04 NOTE — ED Notes (Signed)
Pt c/o "something metal and another wooden object fell on my head today". Pt denies dizziness, n/v. Pt c/o "slight headache and tenderness" to back of head. Skin intact, swelling noted. No loc.

## 2011-09-04 NOTE — Discharge Instructions (Signed)
Head Injury, Adult  You have had a head injury that does not appear serious at this time. A concussion is a state of changed mental ability, usually from a blow to the head. You should take clear liquids for the rest of the day and then resume your regular diet. You should not take sedatives or alcoholic beverages for as long as directed by your caregiver after discharge. After injuries such as yours, most problems occur within the first 24 hours.  SYMPTOMS  These minor symptoms may be experienced after discharge:  · Memory difficulties.  · Dizziness.  · Headaches.  · Double vision.  · Hearing difficulties.  · Depression.  · Tiredness.  · Weakness.  · Difficulty with concentration.  If you experience any of these problems, you should not be alarmed. A concussion requires a few days for recovery. Many patients with head injuries frequently experience such symptoms. Usually, these problems disappear without medical care. If symptoms last for more than one day, notify your caregiver. See your caregiver sooner if symptoms are becoming worse rather than better.  HOME CARE INSTRUCTIONS   · During the next 24 hours you must stay with someone who can watch you for the warning signs listed below.  Although it is unlikely that serious side effects will occur, you should be aware of signs and symptoms which may necessitate your return to this location. Side effects may occur up to 7 - 10 days following the injury. It is important for you to carefully monitor your condition and contact your caregiver or seek immediate medical attention if there is a change in your condition.  SEEK IMMEDIATE MEDICAL CARE IF:   · There is confusion or drowsiness.  · You can not awaken the injured person.  · There is nausea (feeling sick to your stomach) or continued, forceful vomiting.  · You notice dizziness or unsteadiness which is getting worse, or inability to walk.  · You have convulsions or unconsciousness.  · You experience severe,  persistent headaches not relieved by over-the-counter or prescription medicines for pain. (Do not take aspirin as this impairs clotting abilities). Take other pain medications only as directed.  · You can not use arms or legs normally.  · There is clear or bloody discharge from the nose or ears.  MAKE SURE YOU:   · Understand these instructions.  · Will watch your condition.  · Will get help right away if you are not doing well or get worse.  Document Released: 06/01/2005 Document Revised: 05/21/2011 Document Reviewed: 04/19/2009  Exit[employer]® Patient Information ©2012 [employer].

## 2011-10-12 ENCOUNTER — Encounter (HOSPITAL_COMMUNITY): Payer: Self-pay | Admitting: Pharmacist

## 2011-10-26 ENCOUNTER — Inpatient Hospital Stay (HOSPITAL_COMMUNITY): Admission: RE | Admit: 2011-10-26 | Discharge: 2011-10-26 | Payer: BC Managed Care – PPO | Source: Ambulatory Visit

## 2011-10-26 NOTE — Patient Instructions (Signed)
   Your procedure is scheduled on: Tuesday May 21st  Enter through the Main Entrance of [facility] at: [employer] up the phone at the desk and dial [phone] and inform us of your arrival.  Please call this number if you have any problems the morning of surgery: [phone]  Remember: Do not eat food after midnight: Monday Do not drink clear liquids after: midnight Monday Take these medicines the morning of surgery with a SIP OF WATER:  Do not wear jewelry, make-up, or FINGER nail polish Do not wear lotions, powders, perfumes or deodorant. Do not shave 48 hours prior to surgery. Do not bring valuables to the hospital. Contacts, dentures or bridgework may not be worn into surgery.  Leave suitcase in the car. After Surgery it may be brought to your room. For patients being admitted to the hospital, checkout time is 11:00am the day of discharge.  Patients discharged on the day of surgery will not be allowed to drive home.     Remember to use your hibiclens as instructed.Please shower with 1/2 bottle the evening before your surgery and the other 1/2 bottle the morning of surgery. Neck down avoiding private area.

## 2011-11-03 ENCOUNTER — Encounter (HOSPITAL_COMMUNITY): Admission: RE | Payer: Self-pay | Source: Ambulatory Visit

## 2011-11-03 ENCOUNTER — Ambulatory Visit (HOSPITAL_COMMUNITY)
Admission: RE | Admit: 2011-11-03 | Payer: BC Managed Care – PPO | Source: Ambulatory Visit | Admitting: Obstetrics and Gynecology

## 2011-11-03 SURGERY — ROBOTIC ASSISTED TOTAL HYSTERECTOMY WITH BILATERAL SALPINGO OOPHORECTOMY
Anesthesia: General

## 2012-02-22 LAB — HM PAP SMEAR: HM Pap smear: NEGATIVE

## 2012-03-03 DIAGNOSIS — F419 Anxiety disorder, unspecified: Secondary | ICD-10-CM | POA: Insufficient documentation

## 2012-03-17 DIAGNOSIS — D219 Benign neoplasm of connective and other soft tissue, unspecified: Secondary | ICD-10-CM | POA: Insufficient documentation

## 2012-05-30 ENCOUNTER — Other Ambulatory Visit: Payer: Self-pay | Admitting: Obstetrics and Gynecology

## 2012-05-30 DIAGNOSIS — Z1231 Encounter for screening mammogram for malignant neoplasm of breast: Secondary | ICD-10-CM

## 2012-06-17 ENCOUNTER — Ambulatory Visit (HOSPITAL_COMMUNITY)
Admission: RE | Admit: 2012-06-17 | Discharge: 2012-06-17 | Disposition: A | Discharge status: Home / Self Care | Payer: BC Managed Care – PPO | Source: Ambulatory Visit | Attending: Obstetrics and Gynecology | Admitting: Obstetrics and Gynecology

## 2012-06-17 DIAGNOSIS — Z1231 Encounter for screening mammogram for malignant neoplasm of breast: Secondary | ICD-10-CM

## 2012-08-01 ENCOUNTER — Other Ambulatory Visit (HOSPITAL_COMMUNITY): Payer: Self-pay | Admitting: Obstetrics and Gynecology

## 2012-08-01 DIAGNOSIS — Z1231 Encounter for screening mammogram for malignant neoplasm of breast: Secondary | ICD-10-CM

## 2012-08-10 ENCOUNTER — Ambulatory Visit (HOSPITAL_COMMUNITY)
Admission: RE | Admit: 2012-08-10 | Discharge: 2012-08-10 | Disposition: A | Discharge status: Home / Self Care | Payer: BC Managed Care – PPO | Source: Ambulatory Visit | Attending: Obstetrics and Gynecology | Admitting: Obstetrics and Gynecology

## 2012-08-10 DIAGNOSIS — Z1231 Encounter for screening mammogram for malignant neoplasm of breast: Secondary | ICD-10-CM

## 2012-08-10 LAB — HM MAMMOGRAPHY

## 2012-09-14 ENCOUNTER — Telehealth: Payer: Self-pay | Admitting: *Deleted

## 2012-09-14 NOTE — Telephone Encounter (Signed)
Patient calling to confirm appointment for 09-16-12 at 915 am.

## 2012-09-16 ENCOUNTER — Ambulatory Visit (INDEPENDENT_AMBULATORY_CARE_PROVIDER_SITE_OTHER): Payer: BC Managed Care – PPO | Admitting: Nurse Practitioner

## 2012-09-16 ENCOUNTER — Encounter: Payer: Self-pay | Admitting: Nurse Practitioner

## 2012-09-16 VITALS — BP 131/83 | HR 83 | Ht 65.5 in | Wt 234.0 lb

## 2012-09-16 DIAGNOSIS — G2581 Restless legs syndrome: Secondary | ICD-10-CM

## 2012-09-16 NOTE — Patient Instructions (Addendum)
Continue Requip 1 mg twice daily and 2 at bedtime Once CPAP has been titrated and used for 2 weeks, call if her restless leg symptoms have not improved Followup in 6 months to one year based on need

## 2012-09-16 NOTE — Progress Notes (Signed)
HPI:  Patient returns for yearly followup. She has a history of restless leg syndrome with low ferritin levels in the past. She is currently on Ropinirole  4 mg total dose. She says she was recently diagnosed with sleep apnea and due to be titrated this Thursday. She claims her restless leg symptoms maybe a little worse. She gets no regular exercise. She knows she needs to lose some weight. She does not have significant daytime drowsiness from her medication. ROS: Restless legs   Physical Exam General: well developed, well nourished, seated, in no evident distress Head: head normocephalic and atraumatic. Oropharynx benign Neck: supple with no carotid or supraclavicular bruits Cardiovascular: regular rate and rhythm, no murmurs  Neurologic Exam Mental Status: Awake and fully alert. Oriented to place and time. Recent and remote memory intact. Attention span, concentration and fund of knowledge appropriate. Mood and affect appropriate.  Cranial Nerves:Pupils equal, briskly reactive to light. Extraocular movements full without nystagmus. Visual fields full to confrontation. Hearing intact and symmetric to finger snap. Facial sensation intact. Face, tongue, palate move normally and symmetrically. Neck flexion and extension normal.  Motor: Normal bulk and tone. Normal strength in all tested extremity muscles. Sensory.: intact to touch and pinprick and vibratory.  Coordination: Rapid alternating movements normal in all extremities. Finger-to-nose and heel-to-shin performed accurately bilaterally. Gait and Station: Arises from chair without difficulty. Stance is normal. Gait demonstrates normal stride length and balance . Able to heel, toe and tandem walk without difficulty.  Reflexes: 2+ and symmetric. Toes downgoing.     ASSESSMENT: Restless leg syndrome currently on Requip New diagnosis of sleep apnea, titration study later this week     PLAN: Continue Requip 1 mg twice daily and 2 at bedtime,  does not need prescription Once CPAP has been titrated and used for 2 weeks, call if her restless leg symptoms have not improved Encouraged exercise by walking starting with 10 minutes daily and increasing increments until she is walking an hour 5 times a week Watch portions for weight control Followup in 6 months to one year based on need    Nilda Riggs, GNP-BC APRN

## 2012-10-03 ENCOUNTER — Other Ambulatory Visit: Payer: Self-pay

## 2012-10-03 ENCOUNTER — Other Ambulatory Visit: Payer: Self-pay | Admitting: *Deleted

## 2012-10-03 ENCOUNTER — Encounter: Payer: Self-pay | Admitting: *Deleted

## 2012-10-03 ENCOUNTER — Telehealth: Payer: Self-pay | Admitting: *Deleted

## 2012-10-03 MED ORDER — ESTRADIOL 0.0375 MG/24HR TD PTWK: 1.0000 | MEDICATED_PATCH | TRANSDERMAL | Status: DC

## 2012-10-03 MED ORDER — PROGESTERONE MICRONIZED 100 MG PO CAPS: 100.0000 mg | ORAL_CAPSULE | Freq: Every day | ORAL | Status: DC

## 2012-10-03 MED ORDER — ROPINIROLE HCL 1 MG PO TABS: ORAL_TABLET | ORAL | Status: DC

## 2012-10-03 NOTE — Telephone Encounter (Signed)
[facility] requesting 90 day supply of progesterone last given at [facility] 02/22/12; [facility]ex scheduled for 02/24/13 #90/1 [facility] to [facility] until then.

## 2012-10-03 NOTE — Telephone Encounter (Signed)
[facility] climara patch which was given 02/22/12; Aex scheduled 02/24/13 #90/1 rf's sent through to [facility] pharmacy.

## 2013-02-24 ENCOUNTER — Ambulatory Visit: Payer: Self-pay | Admitting: Obstetrics and Gynecology

## 2013-02-27 ENCOUNTER — Ambulatory Visit: Payer: Self-pay | Admitting: Obstetrics and Gynecology

## 2013-03-25 ENCOUNTER — Other Ambulatory Visit: Payer: Self-pay | Admitting: Obstetrics & Gynecology

## 2013-03-31 NOTE — Telephone Encounter (Signed)
Cancelled AEX appt.  Needs appt for RF.  Please call pt.

## 2013-03-31 NOTE — Telephone Encounter (Signed)
Please advise rx was given to patient 10/03/12 #90/1 refills no current AEX has been scheduled.

## 2013-04-03 NOTE — Telephone Encounter (Signed)
Left Message To Call Back  

## 2013-04-05 ENCOUNTER — Telehealth: Payer: Self-pay | Admitting: Obstetrics and Gynecology

## 2013-04-05 ENCOUNTER — Ambulatory Visit (INDEPENDENT_AMBULATORY_CARE_PROVIDER_SITE_OTHER): Payer: BC Managed Care – PPO | Admitting: Nurse Practitioner

## 2013-04-05 ENCOUNTER — Encounter: Payer: Self-pay | Admitting: Nurse Practitioner

## 2013-04-05 VITALS — BP 139/81 | HR 89 | Ht 65.0 in | Wt 233.0 lb

## 2013-04-05 DIAGNOSIS — G2581 Restless legs syndrome: Secondary | ICD-10-CM

## 2013-04-05 MED ORDER — ESTRADIOL 0.0375 MG/24HR TD PTWK: 1.0000 | MEDICATED_PATCH | TRANSDERMAL | Status: DC

## 2013-04-05 MED ORDER — ROPINIROLE HCL 1 MG PO TABS: ORAL_TABLET | ORAL | Status: DC

## 2013-04-05 NOTE — Progress Notes (Signed)
[facility]  PATIENT: Amy Burnett DOB: 1951-02-13   REASON FOR VISIT: Followup for restless legs    HISTORY OF PRESENT ILLNESS:Amy Burnett, 62 year old returns for followup. She has a history of restless leg syndrome with low ferritin levels in the past. She is currently on Ropinirole 4 mg total dose. She says she was recently diagnosed with sleep apnea and is using CPAP at 4 cm. She is not sure her sleep doctor is but  study was not done at this office. She has much less daytime drowsiness now and says she gets 7-8 hours of sleep every night She claims her restless leg symptoms are stable.  She gets no regular exercise. She knows she needs to lose some weight. She does not have significant side effects to her Requip .    REVIEW OF SYSTEMS: Full 14 system review of systems performed and notable only for:  Constitutional: N/A  Cardiovascular: N/A  Ear/Nose/Throat: N/A  Skin: N/A  Eyes: N/A  Respiratory: N/A  Gastroitestinal: N/A  Hematology/Lymphatic: N/A  Endocrine: N/A Musculoskeletal:N/A  Allergy/Immunology: N/A  Neurological: N/A Sleep restless legs Psychiatric: N/A   ALLERGIES: No Known Allergies  HOME MEDICATIONS: Outpatient Prescriptions Prior to Visit  Medication Sig Dispense Refill  . estradiol (CLIMARA - DOSED IN MG/24 HR) 0.0375 mg/24hr Place 1 patch onto the skin once a week. Changes on monday      . GLUCOSAMINE-CHONDROIT-BIOFL-MN PO Take by mouth daily.      Marland Kitchen rOPINIRole (REQUIP) 1 MG tablet 1 tab twice daily and 2 tabs at hs  360 tablet  3  . sertraline (ZOLOFT) 100 MG tablet Take 200 mg by mouth every morning.       . Vitamin D, Ergocalciferol, (DRISDOL) 50000 UNITS CAPS Take 50,000 Units by mouth every 14 (fourteen) days. Every other week on Monday      . estradiol (CLIMARA) 0.0375 mg/24hr Place 1 patch (0.0375 mg total) onto the skin once a week.  90 patch  1  . progesterone (PROMETRIUM) 100 MG capsule Take 100 mg by mouth every morning.        . progesterone (PROMETRIUM) 100 MG capsule Take 1 capsule (100 mg total) by mouth daily.  90 capsule  1   No facility-administered medications prior to visit.    PAST MEDICAL HISTORY: Past Medical History  Diagnosis Date  . Anxiety   . Restless leg     PAST SURGICAL HISTORY: Past Surgical History  Procedure Laterality Date  . Abdominal surgery      FAMILY HISTORY: History reviewed. No pertinent family history.  SOCIAL HISTORY: History   Social History  . Marital Status: Married    Spouse Name: Tom    Number of Children: 2  . Years of Education: college   Occupational History  .      full time works for her self   Social History Main Topics  . Smoking status: Former Smoker    Quit date: 06/15/1985  . Smokeless tobacco: Never Used  . Alcohol Use: 0.6 oz/week    1 Glasses of wine per week     Comment: with dinner  . Drug Use: No  . Sexual Activity: Not on file   Other Topics Concern  . Not on file   Social History Narrative   Patient lives at home with her husband Elijah Birk)    Patient works full time   Education- college   Right handed   Caffeine- diet coke three daily  PHYSICAL EXAM  Filed Vitals:   04/05/13 0937  BP: 139/81  Pulse: 89  Height: 5\' 5"  (1.651 m)  Weight: 233 lb (105.688 kg)   Body mass index is 38.77 kg/(m^2).  Generalized: Well developed, obese female in no acute distress   Neurological examination   Mentation: Alert oriented to time, place, history taking. Follows all commands speech and language fluent  Cranial nerve II-XII: Pupils were equal round reactive to light extraocular movements were full, visual field were full on confrontational test. Facial sensation and strength were normal. hearing was intact to finger rubbing bilaterally. Uvula tongue midline. head turning and shoulder shrug and were normal and symmetric.Tongue protrusion into cheek strength was normal. Motor: normal bulk and tone, full strength in the BUE,  BLE, fine finger movements normal, no pronator drift. No focal weakness Coordination: finger-nose-finger, performed smoothly, no dysmetria Reflexes: Brachioradialis 2/2, biceps 2/2, triceps 2/2, patellar 2/2, Achilles 2/2, plantar responses were flexor bilaterally. Gait and Station: Rising up from seated position without assistance, normal stance,  moderate stride, good arm swing, smooth turning, able to perform tiptoe, and heel walking without difficulty.   DIAGNOSTIC DATA (LABS, IMAGING, TESTING) -None to review         ASSESSMENT AND PLAN  62 y.o. year old female  has a past medical history of Anxiety and Restless leg. here to followup for restless legs. Currently on Requip total dose  4 mg daily.  Continue Requip at current dose Will renew for the next year Followup yearly and when necessary  Nilda Riggs, [facility], [facility], APRN  [facility] [address], [address] [place], [place] [address] [phone]

## 2013-04-05 NOTE — Patient Instructions (Signed)
Continue Requip at current dose Will renew for the next year Followup yearly and when necessary

## 2013-04-05 NOTE — Addendum Note (Signed)
Addended by: Lorraine Lax on: 04/05/2013 11:45 AM   Modules accepted: Orders

## 2013-04-05 NOTE — Telephone Encounter (Addendum)
Amy Burnett scheduled AEX for 04/06/13 with Dr. Vernie Shanks Patch 0.0375 #4 patch/0rf's sent to pharmacy patient is aware.

## 2013-04-05 NOTE — Telephone Encounter (Signed)
Left Message To Call Back  

## 2013-04-06 ENCOUNTER — Encounter: Payer: Self-pay | Admitting: Obstetrics and Gynecology

## 2013-04-06 ENCOUNTER — Ambulatory Visit (INDEPENDENT_AMBULATORY_CARE_PROVIDER_SITE_OTHER): Payer: BC Managed Care – PPO | Admitting: Obstetrics and Gynecology

## 2013-04-06 VITALS — BP 130/70 | HR 80 | Resp 16 | Ht 65.5 in | Wt 232.5 lb

## 2013-04-06 DIAGNOSIS — Z0000 Encounter for general adult medical examination without abnormal findings: Secondary | ICD-10-CM

## 2013-04-06 DIAGNOSIS — Z01419 Encounter for gynecological examination (general) (routine) without abnormal findings: Secondary | ICD-10-CM

## 2013-04-06 DIAGNOSIS — K6289 Other specified diseases of anus and rectum: Secondary | ICD-10-CM

## 2013-04-06 LAB — POCT URINALYSIS DIPSTICK
Glucose, UA: NEGATIVE
Leukocytes, UA: NEGATIVE
Nitrite, UA: NEGATIVE
Urobilinogen, UA: NEGATIVE

## 2013-04-06 MED ORDER — PROGESTERONE MICRONIZED 100 MG PO CAPS: 100.0000 mg | ORAL_CAPSULE | Freq: Every day | ORAL | Status: DC

## 2013-04-06 MED ORDER — ESTRADIOL 0.0375 MG/24HR TD PTWK: 1.0000 | MEDICATED_PATCH | TRANSDERMAL | Status: DC

## 2013-04-06 NOTE — Progress Notes (Signed)
Patient ID: Amy Burnett, female   DOB: 1950/10/26, 62 y.o.   MRN: [id number] GYNECOLOGY VISIT  PCP:  Antony Haste, MD  Referring provider:   HPI: 62 y.o.   Married  Caucasian  female   G2P2002 with Patient's last menstrual period was 01/04/2012.   here for   AEX. No post menopausal bleeding since lowered dosage of HRT with Dr. Tresa Res over one year ago. Occasional hot flashes.  Tried to stop HRT in the past and did not do well.   Using CPAP for sleep apnea.  Working well.  Hgb:     Urine:  Neg  GYNECOLOGIC HISTORY: Patient's last menstrual period was 01/04/2012. Sexually active:  yes Partner preference: female Contraception:   postmenopausal Menopausal hormone therapy: Estradiol and Progesterone DES exposure:   no Blood transfusions:   no Sexually transmitted diseases:   no GYN Procedures:  Hysteroscopy x2.  Resection of myoma in 2010 and myoma and polyp in 2011. Mammogram:  08-10-12 wnl:[facility]              Pap:   02-22-12 wnl, negative HR HPV. History of abnormal pap smear:  no   OB History   Grav Para Term Preterm Abortions TAB SAB Ect Mult Living   2 2 2       2        LIFESTYLE: Exercise:      Climbing/walking/lifting       Tobacco:      no Alcohol:         7-10 drinks per week Drug use:      no  OTHER HEALTH MAINTENANCE: Tetanus/TDap:   08/2008 Gardisil:  NA Influenza:    02/2012 Zostavax:    2012  Bone density:  Years ago Colonoscopy:  02/2006 [facility].  Next colonoscopy due 02/2016.  States history of hemorrhoids.  Cholesterol check:  borderline  Family History  Problem Relation Age of Onset  . Breast cancer Mother 37  . Pancreatic cancer Mother 68    Patient Active Problem List   Diagnosis Date Noted  . HOARSENESS 12/20/2009  . SHORTNESS OF BREATH 08/30/2009  . MITRAL VALVE PROLAPSE, HX OF 08/30/2009  . UTI 06/11/2009  . OBESITY, UNSPECIFIED 08/28/2008  . RESTLESS LEG SYNDROME 08/28/2008  . NEPHROLITHIASIS, HX OF 08/28/2008    Past Medical History  Diagnosis Date  . Anxiety   . Restless leg   . History of kidney stones   . Panic attacks     Past Surgical History  Procedure Laterality Date  . Tonsillectomy and adenoidectomy      -age 44  . Hysteroscopy  01-28-09    resected fibroid(3cm) 2nd PMB--Dr. Tresa Res   . Hysteroscopy  09-23-09    resected myoma and polyp 2nd to PMB-- Dr. Tresa Res  . Lithotripsy  2003    ALLERGIES: Review of patient's allergies indicates no known allergies.  Current Outpatient Prescriptions  Medication Sig Dispense Refill  . ALPRAZolam (XANAX) 0.5 MG tablet Take 0.5 mg by mouth daily.      Marland Kitchen estradiol (CLIMARA - DOSED IN MG/24 HR) 0.0375 mg/24hr patch Place 1 patch (0.0375 mg total) onto the skin once a week. Changes on monday  4 patch  0  . progesterone (PROMETRIUM) 100 MG capsule Take 100 mg by mouth daily.      Marland Kitchen rOPINIRole (REQUIP) 1 MG tablet 1 tab twice daily and 2 tabs at hs  360 tablet  3  . sertraline (ZOLOFT) 100 MG tablet Take 200  mg by mouth every morning.       . Vitamin D, Ergocalciferol, (DRISDOL) 50000 UNITS CAPS Take 50,000 Units by mouth every 14 (fourteen) days. Every other week on Monday       No current facility-administered medications for this visit.     ROS:  Pertinent items are noted in HPI.  SOCIAL HISTORY:  Self employed Surveyor, mining.   Network engineer.  PHYSICAL EXAMINATION:    BP 130/70  Pulse 80  Resp 16  Ht 5' 5.5" (1.664 m)  Wt 232 lb 8 oz (105.461 kg)  BMI 38.09 kg/m2  LMP 01/04/2012   Wt Readings from Last 3 Encounters:  04/06/13 232 lb 8 oz (105.461 kg)  04/05/13 233 lb (105.688 kg)  09/16/12 234 lb (106.142 kg)     Ht Readings from Last 3 Encounters:  04/06/13 5' 5.5" (1.664 m)  04/05/13 5\' 5"  (1.651 m)  09/16/12 5' 5.5" (1.664 m)    General appearance: alert, cooperative and appears stated age Head: Normocephalic, without obvious abnormality, atraumatic Neck: no adenopathy, supple, symmetrical, trachea midline and thyroid not  enlarged, symmetric, no tenderness/mass/nodules Lungs: clear to auscultation bilaterally Breasts: Inspection negative, No nipple retraction or dimpling, No nipple discharge or bleeding, No axillary or supraclavicular adenopathy, Normal to palpation without dominant masses Heart: regular rate and rhythm Abdomen: soft, non-tender; no masses,  no organomegaly Extremities: extremities normal, atraumatic, no cyanosis or edema Skin: Skin color, texture, turgor normal. No rashes or lesions Lymph nodes: Cervical, supraclavicular, and axillary nodes normal. No abnormal inguinal nodes palpated Neurologic: Grossly normal  Pelvic: External genitalia:  no lesions              Urethra:  normal appearing urethra with no masses, tenderness or lesions              Bartholins and Skenes: normal                 Vagina: normal appearing vagina with normal color and discharge, no lesions              Cervix: normal appearance.  4 mm red slightly raised area at 3:00 position.               Pap and high risk HPV testing done: yes.            Bimanual Exam:  Uterus:  uterus is normal size, shape, consistency and nontender                                      Adnexa: normal adnexa in size, nontender and no masses                                      Rectovaginal: Confirms. Rectal nodule 0.75 cm at 3 o'clock position.                                       Anus:  normal sphincter tone, no lesions  ASSESSMENT  Normal gynecologic exam. Nondescript area of the cervix.  Status post hysteroscopic myomectomy twice for myomas and a polyp.  No postmenopausal bleeding.  HRT patient.  Rectal nodule.   PLAN  Mammogram yearly. Pap smear and high risk HPV testing today.  Counseled on HRT.  Will continue after reviewing risks and benefits.  See Epic orders. Refer to [facility]. Return annually or prn   An After Visit Summary was printed and given to the patient.

## 2013-04-06 NOTE — Patient Instructions (Signed)

## 2013-04-06 NOTE — Progress Notes (Signed)
Appointment made while patient in office for [facility]. Scheduled for 10/27 at 0900. Patient agreeable to date/time/location.

## 2013-04-07 LAB — IPS PAP TEST WITH HPV

## 2013-04-10 ENCOUNTER — Ambulatory Visit (INDEPENDENT_AMBULATORY_CARE_PROVIDER_SITE_OTHER): Payer: BC Managed Care – PPO | Admitting: Nurse Practitioner

## 2013-04-10 ENCOUNTER — Encounter: Payer: Self-pay | Admitting: Internal Medicine

## 2013-04-10 ENCOUNTER — Encounter: Payer: Self-pay | Admitting: Nurse Practitioner

## 2013-04-10 VITALS — BP 134/78 | HR 100 | Ht 65.5 in | Wt 231.0 lb

## 2013-04-10 DIAGNOSIS — K6289 Other specified diseases of anus and rectum: Secondary | ICD-10-CM

## 2013-04-10 MED ORDER — NA SULFATE-K SULFATE-MG SULF 17.5-3.13-1.6 GM/177ML PO SOLN: 1.0000 | Freq: Once | ORAL | Status: AC

## 2013-04-10 NOTE — Patient Instructions (Signed)

## 2013-04-10 NOTE — Progress Notes (Signed)
HPI :  Patient is a 62 year old female known to Dr. Leone Payor. She is referred by GYN for evaluation of a rectal nodule found on exam. Patient has no GI complaints. Bowels normal. No blood in stool. Weight stable.   Past Medical History  Diagnosis Date  . Anxiety   . Restless leg   . History of kidney stones   . Panic attacks   . Sleep apnea     Family History  Problem Relation Age of Onset  . Breast cancer Mother 45  . Pancreatic cancer Mother 20   History  Substance Use Topics  . Smoking status: Former Smoker    Quit date: 06/15/1985  . Smokeless tobacco: Never Used  . Alcohol Use: 4.8 oz/week    8 Glasses of wine per week     Comment: with dinner   Current Outpatient Prescriptions  Medication Sig Dispense Refill  . ALPRAZolam (XANAX) 0.5 MG tablet Take 0.5 mg by mouth daily.      Marland Kitchen estradiol (CLIMARA - DOSED IN MG/24 HR) 0.0375 mg/24hr patch Place 1 patch (0.0375 mg total) onto the skin once a week. Changes on monday  12 patch  3  . progesterone (PROMETRIUM) 100 MG capsule Take 1 capsule (100 mg total) by mouth daily.  90 capsule  3  . rOPINIRole (REQUIP) 1 MG tablet 1 tab twice daily and 2 tabs at hs  360 tablet  3  . sertraline (ZOLOFT) 100 MG tablet Take 200 mg by mouth every morning.       . Vitamin D, Ergocalciferol, (DRISDOL) 50000 UNITS CAPS Take 50,000 Units by mouth every 14 (fourteen) days. Every other week on Monday       No current facility-administered medications for this visit.   No Known Allergies   Review of Systems: All systems reviewed and negative except where noted in HPI.   Physical Exam: BP 134/78  Pulse 100  Ht 5' 5.5" (1.664 m)  Wt 231 lb (104.781 kg)  BMI 37.84 kg/m2  LMP 01/04/2012 Constitutional: Pleasant,well-developed, white female in no acute distress. HEENT: Normocephalic and atraumatic. Conjunctivae are normal. No scleral icterus. Neck supple.  Cardiovascular: slightly tach, regular rhythm.  Pulmonary/chest: Effort normal and  breath sounds normal. No wheezing, rales or rhonchi. Abdominal: Soft, nondistended, nontender. Bowel sounds active throughout. There are no masses palpable. No hepatomegaly. Rectal. No DRE abnormalites appreciate in left Sim's position. After placing patient in lithotomy position and repeating DRE there was a round, mobile nodule at 3 o'clock. Heme negative stool. A few internal hemorrhoids on anoscopy Extremities: no edema Lymphadenopathy: No cervical adenopathy noted. Neurological: Alert and oriented to person place and time. Skin: Skin is warm and dry. No rashes noted. Psychiatric: Normal mood and affect. Behavior is normal.  Screening colonoscopy 2007. Findings:  a small submucoslal lesion in ascending colon and a firm submucosal nodule in rectum.  Exam otherwise normal. Rectal nodule biopsy negative for adenomatous changes. Tissue was edematous with focal smooth muscle proliferaiton.   ASSESSMENT AND PLAN:   62 year old female with smooth, mobile rectal nodule at 3 o'clock ( in lithotomy position). She had a rectal nodule on colonoscopy in 2007 (biopsies negative for malignancy), see above. Not sure if nodule felt today correlates location wise with 2007 colonoscopy findings but if so then likely no need to repeat colonoscopy at this point. A flexible sigmoidoscopy may be reasonable. Will talk with Dr. Leone Payor, patient's primary Gi, regarding my findings. We will call patient with further  recommendations

## 2013-04-11 NOTE — Progress Notes (Signed)
I would not do colonoscopy but rather consider an Endoscopic [place]. Will discuss.  Iva Boop, MD, [facility]

## 2013-04-14 ENCOUNTER — Encounter: Payer: BC Managed Care – PPO | Admitting: Internal Medicine

## 2013-04-19 ENCOUNTER — Ambulatory Visit: Payer: BC Managed Care – PPO | Admitting: Internal Medicine

## 2013-04-20 ENCOUNTER — Other Ambulatory Visit: Payer: Self-pay

## 2013-05-17 ENCOUNTER — Encounter: Payer: Self-pay | Admitting: Gastroenterology

## 2013-05-24 ENCOUNTER — Encounter: Payer: Self-pay | Admitting: Gastroenterology

## 2013-05-24 ENCOUNTER — Encounter: Payer: Self-pay | Admitting: Internal Medicine

## 2013-05-24 ENCOUNTER — Ambulatory Visit (INDEPENDENT_AMBULATORY_CARE_PROVIDER_SITE_OTHER): Payer: BC Managed Care – PPO | Admitting: Internal Medicine

## 2013-05-24 VITALS — BP 144/90 | HR 84 | Ht 65.0 in | Wt 233.4 lb

## 2013-05-24 DIAGNOSIS — K6289 Other specified diseases of anus and rectum: Secondary | ICD-10-CM

## 2013-05-24 NOTE — Progress Notes (Signed)
  Subjective:    Patient ID: Amy Burnett, female    DOB: 12/14/1950, 62 y.o.   MRN: [id number]  HPI The patient is a pleasant woman I know from screening colonoscopy in 2007. At that time I saw a submucosal rectal nodule, biopsies were nondiagnostic.It did have focal smooth muscle proliferation. She was evaluated by her gynecologist recently who palpated this and then seen by our nurse practitioner. She is here for mu evaluated further today. She denies any symptoms of knowledge that it is present.  No Known Allergies Outpatient Prescriptions Prior to Visit  Medication Sig Dispense Refill  . ALPRAZolam (XANAX) 0.5 MG tablet Take 0.5 mg by mouth daily as needed.       . estradiol (CLIMARA - DOSED IN MG/24 HR) 0.0375 mg/24hr patch Place 1 patch (0.0375 mg total) onto the skin once a week. Changes on monday  12 patch  3  . progesterone (PROMETRIUM) 100 MG capsule Take 1 capsule (100 mg total) by mouth daily.  90 capsule  3  . rOPINIRole (REQUIP) 1 MG tablet 1 tab twice daily and 2 tabs at hs  360 tablet  3  . sertraline (ZOLOFT) 100 MG tablet Take 200 mg by mouth every morning.       . Vitamin D, Ergocalciferol, (DRISDOL) 50000 UNITS CAPS Take 50,000 Units by mouth every 14 (fourteen) days. Every other week on Monday       No facility-administered medications prior to visit.   Past Medical History  Diagnosis Date  . Anxiety   . Restless leg   . History of kidney stones   . Panic attacks   . Sleep apnea   . Rectal nodule    Past Surgical History  Procedure Laterality Date  . Tonsillectomy and adenoidectomy      -age 17  . Hysteroscopy  01-28-09    resected fibroid(3cm) 2nd PMB--Dr. Romine   . Hysteroscopy  09-23-09    resected myoma and polyp 2nd to PMB-- Dr. Romine  . Lithotripsy  2003  . Colonoscopy  03/09/2006   History   Social History  . Marital Status: Married    Spouse Name: Tom    Number of Children: 2  . Years of Education: college   Occupational History  .       full time works for her self   Social History Main Topics  . Smoking status: Former Smoker    Quit date: 06/15/1985  . Smokeless tobacco: Never Used  . Alcohol Use: 4.8 oz/week    8 Glasses of wine per week     Comment: with dinner  . Drug Use: No  . Sexual Activity: Yes    Partners: Male    Birth Control/ Protection: Post-menopausal          Social History Narrative   Patient lives at home with her husband (Tom)    Patient works full time   Education- college   Right handed   Caffeine- diet coke three daily   Family History  Problem Relation Age of Onset  . Breast cancer Mother 80  . Pancreatic cancer Mother 91    Review of Systems As per history of present illness    Objective:   Physical Exam Obese, well-developed well-nourished white woman in no acute distress Rectal exam with female chaperone present reveals a left anterior lateral anal tag. There is no perianal dermatitis. There is a firm somewhat rubbery round mobile mass palpable in the left lateral position, really   at about 6:00 when she's in left lateral decubitus.  Anoscopy is performed with the patient in the left lateral decubitus position and a chaperone present. It reveals an approximately 1 cm submucosal mass just above the dentate line as mentioned above. Smooth normal mucosa overlying.       Assessment & Plan:   1. Rectal nodule     

## 2013-05-24 NOTE — Patient Instructions (Signed)
Today you have been set up for an endoscopic ultrasound to be done at [facility] by Dr. Rob Bunting.  Please follow the instructions you have been given today.   I appreciate the opportunity to care for you.

## 2013-05-24 NOTE — Assessment & Plan Note (Signed)
I think this is enlarging - have discussed with Dr. Christella Hartigan and she will be scheduled for EUS and possible biopsy/removal.

## 2013-06-01 ENCOUNTER — Encounter (HOSPITAL_COMMUNITY)
Admission: RE | Disposition: A | Discharge status: Home / Self Care | Payer: Self-pay | Source: Ambulatory Visit | Attending: Gastroenterology

## 2013-06-01 ENCOUNTER — Encounter (HOSPITAL_COMMUNITY): Payer: Self-pay | Admitting: Gastroenterology

## 2013-06-01 ENCOUNTER — Ambulatory Visit (HOSPITAL_COMMUNITY)
Admission: RE | Admit: 2013-06-01 | Discharge: 2013-06-01 | Disposition: A | Discharge status: Home / Self Care | Payer: BC Managed Care – PPO | Source: Ambulatory Visit | Attending: Gastroenterology | Admitting: Gastroenterology

## 2013-06-01 DIAGNOSIS — Z87442 Personal history of urinary calculi: Secondary | ICD-10-CM | POA: Insufficient documentation

## 2013-06-01 DIAGNOSIS — Z79899 Other long term (current) drug therapy: Secondary | ICD-10-CM | POA: Insufficient documentation

## 2013-06-01 DIAGNOSIS — Z87891 Personal history of nicotine dependence: Secondary | ICD-10-CM | POA: Insufficient documentation

## 2013-06-01 DIAGNOSIS — K620 Anal polyp: Secondary | ICD-10-CM | POA: Insufficient documentation

## 2013-06-01 DIAGNOSIS — K6289 Other specified diseases of anus and rectum: Secondary | ICD-10-CM

## 2013-06-01 DIAGNOSIS — G4730 Sleep apnea, unspecified: Secondary | ICD-10-CM | POA: Insufficient documentation

## 2013-06-01 HISTORY — PX: EUS: SHX5427

## 2013-06-01 SURGERY — ULTRASOUND, LOWER GI TRACT, ENDOSCOPIC
Anesthesia: Moderate Sedation

## 2013-06-01 MED ORDER — FENTANYL CITRATE 0.05 MG/ML IJ SOLN
INTRAMUSCULAR | Status: AC
  Filled 2013-06-01: qty 2

## 2013-06-01 MED ORDER — MIDAZOLAM HCL 10 MG/2ML IJ SOLN
INTRAMUSCULAR | Status: AC
  Filled 2013-06-01: qty 2

## 2013-06-01 MED ORDER — FENTANYL CITRATE 0.05 MG/ML IJ SOLN
INTRAMUSCULAR | Status: DC | PRN
Start: 2013-06-01 — End: 2013-06-01
  Administered 2013-06-01 (×2): 25 ug via INTRAVENOUS

## 2013-06-01 MED ORDER — MIDAZOLAM HCL 10 MG/2ML IJ SOLN
INTRAMUSCULAR | Status: DC | PRN
  Administered 2013-06-01 (×2): 2 mg via INTRAVENOUS

## 2013-06-01 MED ORDER — SODIUM CHLORIDE 0.9 % IV SOLN
INTRAVENOUS | Status: DC
  Administered 2013-06-01: 500 mL via INTRAVENOUS

## 2013-06-01 MED ORDER — DIPHENHYDRAMINE HCL 50 MG/ML IJ SOLN
INTRAMUSCULAR | Status: AC
  Filled 2013-06-01: qty 1

## 2013-06-01 NOTE — Interval H&P Note (Signed)
History and Physical Interval Note:  06/01/2013 9:31 AM  Amy Burnett  has presented today for surgery, with the diagnosis of Rectal nodule [569.49]  The various methods of treatment have been discussed with the patient and family. After consideration of risks, benefits and other options for treatment, the patient has consented to  Procedure(s): LOWER ENDOSCOPIC ULTRASOUND (EUS) (N/A) as a surgical intervention .  The patient's history has been reviewed, patient examined, no change in status, stable for surgery.  I have reviewed the patient's chart and labs.  Questions were answered to the patient's satisfaction.     Rachael Fee

## 2013-06-01 NOTE — Op Note (Signed)
[facility] [address] [place], [address]   ENDOSCOPIC ULTRASOUND PROCEDURE REPORT  PATIENT: Amy, Burnett  MR#: [id number] BIRTHDATE: 03/06/1951  GENDER: Female ENDOSCOPIST: Amy Fee, MD REFERRED BY:  Stan Head, MD PROCEDURE DATE:  06/01/2013 PROCEDURE:   Lower EUS, flex sig with biopsy ASA CLASS:      Class II INDICATIONS:   1.  subepithelial distal rectal lesion noted 2007 colonoscopy, appears to have grown by anoscopy last week Dr. Leone Payor. MEDICATIONS: Fentanyl 50 mcg IV and Versed 5 mg IV  DESCRIPTION OF PROCEDURE:   After the risks benefits and alternatives of the procedure were  explained, informed consent was obtained. The patient was then placed in the left, lateral, decubitus postion and IV sedation was administered. Throughout the procedure, the patients blood pressure, pulse and oxygen saturations were monitored continuously.  Under direct visualization, the Pentax Radial EUS [id number]  endoscope was introduced through the anus  and advanced to the sigmoid colon . Water was used as necessary to provide an acoustic interface.  Upon completion of the imaging, water was removed and the patient was sent to the recovery room in satisfactory condition.   Sigmoidoscopic findings: 1. Approximately 1cm across, subepithelial lesion in distal rectum. This lies 1cm from the anal verge along left lateral wall of the rectum.  Following EUS evaluation, this was sampled with tunnel biopsy extensively.  EUS findings: 1. The lesion described above corresponds with a 9.54mm by 10.52mm hypoechoic, relatively round mass in distal rectum. The lesion appears to communicate with the muscularis propria layer of the rectal wall.  FNA not performed, instead tunnel biopsy done with forceps. 2. No perirectal adenopathy.  Impression: 9.22mm by 10.76mm hyopechoic, solid, subepithelial lesion in distal rectum (lays 1cm from the anal verge).  This was sampled  with mucosal, tunnel biopsy.  I suspect small GIST or carcinoid lesion. Await mucosal biospy results, she will likely need trans-anal resection pending those results (cartainly should be removed if carcinoid confirmed).   _______________________________ eSignedRachael Fee, MD 06/01/2013 11:07 AM

## 2013-06-01 NOTE — H&P (View-Only) (Signed)
Subjective:    Patient ID: Amy Burnett, female    DOB: Mar 20, 1951, 62 y.o.   MRN: [id number]  HPI The patient is a pleasant woman I know from screening colonoscopy in 2007. At that time I saw a submucosal rectal nodule, biopsies were nondiagnostic.It did have focal smooth muscle proliferation. She was evaluated by her gynecologist recently who palpated this and then seen by our nurse practitioner. She is here for mu evaluated further today. She denies any symptoms of knowledge that it is present.  No Known Allergies Outpatient Prescriptions Prior to Visit  Medication Sig Dispense Refill  . ALPRAZolam (XANAX) 0.5 MG tablet Take 0.5 mg by mouth daily as needed.       Marland Kitchen estradiol (CLIMARA - DOSED IN MG/24 HR) 0.0375 mg/24hr patch Place 1 patch (0.0375 mg total) onto the skin once a week. Changes on monday  12 patch  3  . progesterone (PROMETRIUM) 100 MG capsule Take 1 capsule (100 mg total) by mouth daily.  90 capsule  3  . rOPINIRole (REQUIP) 1 MG tablet 1 tab twice daily and 2 tabs at hs  360 tablet  3  . sertraline (ZOLOFT) 100 MG tablet Take 200 mg by mouth every morning.       . Vitamin D, Ergocalciferol, (DRISDOL) 50000 UNITS CAPS Take 50,000 Units by mouth every 14 (fourteen) days. Every other week on Monday       No facility-administered medications prior to visit.   Past Medical History  Diagnosis Date  . Anxiety   . Restless leg   . History of kidney stones   . Panic attacks   . Sleep apnea   . Rectal nodule    Past Surgical History  Procedure Laterality Date  . Tonsillectomy and adenoidectomy      -age 77  . Hysteroscopy  01-28-09    resected fibroid(3cm) 2nd PMB--Dr. Tresa Res   . Hysteroscopy  09-23-09    resected myoma and polyp 2nd to PMB-- Dr. Tresa Res  . Lithotripsy  2003  . Colonoscopy  03/09/2006   History   Social History  . Marital Status: Married    Spouse Name: Amy Burnett    Number of Children: 2  . Years of Education: college   Occupational History  .       full time works for her self   Social History Main Topics  . Smoking status: Former Smoker    Quit date: 06/15/1985  . Smokeless tobacco: Never Used  . Alcohol Use: 4.8 oz/week    8 Glasses of wine per week     Comment: with dinner  . Drug Use: No  . Sexual Activity: Yes    Partners: Male    Birth Control/ Protection: Post-menopausal          Social History Narrative   Patient lives at home with her husband Amy Burnett)    Patient works full time   Education- college   Right handed   Caffeine- diet coke three daily   Family History  Problem Relation Age of Onset  . Breast cancer Mother 47  . Pancreatic cancer Mother 75    Review of Systems As per history of present illness    Objective:   Physical Exam Obese, well-developed well-nourished white woman in no acute distress Rectal exam with female chaperone present reveals a left anterior lateral anal tag. There is no perianal dermatitis. There is a firm somewhat rubbery round mobile mass palpable in the left lateral position, really  at about 6:00 when she's in left lateral decubitus.  Anoscopy is performed with the patient in the left lateral decubitus position and a chaperone present. It reveals an approximately 1 cm submucosal mass just above the dentate line as mentioned above. Smooth normal mucosa overlying.       Assessment & Plan:   1. Rectal nodule

## 2013-06-02 ENCOUNTER — Encounter (HOSPITAL_COMMUNITY): Payer: Self-pay | Admitting: Gastroenterology

## 2013-08-02 ENCOUNTER — Other Ambulatory Visit: Payer: Self-pay | Admitting: Certified Nurse Midwife

## 2013-08-02 NOTE — Telephone Encounter (Signed)
Last refilled: 01/13/11 #26/1 refill  Last AEX:  04/06/13 no rx was given/sent  Last Vitamin D level checked at 43 02/09/11 AEX; 04/09/14   Given new Vitamin D Protocol     Please Advise.  (Chart In Your Door)

## 2013-08-02 NOTE — Telephone Encounter (Signed)
Please have the patient come in for a lab visit to recheck her vit D level. I can reassess her prescription after checking a level.  Thanks.

## 2013-08-04 NOTE — Telephone Encounter (Signed)
Left Message To Call Back  

## 2013-08-07 ENCOUNTER — Other Ambulatory Visit: Payer: Self-pay | Admitting: Obstetrics and Gynecology

## 2013-08-07 DIAGNOSIS — E559 Vitamin D deficiency, unspecified: Secondary | ICD-10-CM

## 2013-08-07 NOTE — Telephone Encounter (Signed)
Order placed. Thanks.

## 2013-08-07 NOTE — Telephone Encounter (Signed)
Scheduled patient for 08/15/13 @ 9:45 can you put a order in please?  Thanks!

## 2013-08-07 NOTE — Telephone Encounter (Signed)
Your welcome.

## 2013-08-15 ENCOUNTER — Other Ambulatory Visit (INDEPENDENT_AMBULATORY_CARE_PROVIDER_SITE_OTHER): Payer: BC Managed Care – PPO

## 2013-08-15 DIAGNOSIS — E559 Vitamin D deficiency, unspecified: Secondary | ICD-10-CM

## 2013-08-16 LAB — VITAMIN D 25 HYDROXY (VIT D DEFICIENCY, FRACTURES): Vit D, 25-Hydroxy: 46 ng/mL (ref 30–89)

## 2013-12-08 ENCOUNTER — Other Ambulatory Visit: Payer: Self-pay | Admitting: Obstetrics and Gynecology

## 2013-12-08 DIAGNOSIS — Z1231 Encounter for screening mammogram for malignant neoplasm of breast: Secondary | ICD-10-CM

## 2013-12-13 ENCOUNTER — Ambulatory Visit (HOSPITAL_COMMUNITY)
Admission: RE | Admit: 2013-12-13 | Discharge: 2013-12-13 | Disposition: A | Discharge status: Home / Self Care | Payer: BC Managed Care – PPO | Source: Ambulatory Visit | Attending: Obstetrics and Gynecology | Admitting: Obstetrics and Gynecology

## 2013-12-13 DIAGNOSIS — Z1231 Encounter for screening mammogram for malignant neoplasm of breast: Secondary | ICD-10-CM | POA: Insufficient documentation

## 2014-02-07 ENCOUNTER — Encounter: Payer: Self-pay | Admitting: Obstetrics and Gynecology

## 2014-04-09 ENCOUNTER — Ambulatory Visit: Payer: BC Managed Care – PPO | Admitting: Obstetrics and Gynecology

## 2014-04-11 ENCOUNTER — Ambulatory Visit (INDEPENDENT_AMBULATORY_CARE_PROVIDER_SITE_OTHER): Payer: BC Managed Care – PPO | Admitting: Obstetrics and Gynecology

## 2014-04-11 ENCOUNTER — Encounter: Payer: Self-pay | Admitting: Obstetrics and Gynecology

## 2014-04-11 VITALS — BP 124/78 | HR 88 | Resp 14 | Ht 66.0 in | Wt 245.2 lb

## 2014-04-11 DIAGNOSIS — Z0000 Encounter for general adult medical examination without abnormal findings: Secondary | ICD-10-CM

## 2014-04-11 DIAGNOSIS — Z01419 Encounter for gynecological examination (general) (routine) without abnormal findings: Secondary | ICD-10-CM

## 2014-04-11 LAB — POCT URINALYSIS DIPSTICK
Bilirubin, UA: NEGATIVE
Glucose, UA: NEGATIVE
Ketones, UA: NEGATIVE
Leukocytes, UA: NEGATIVE
Nitrite, UA: NEGATIVE
PROTEIN UA: NEGATIVE
RBC UA: NEGATIVE
Urobilinogen, UA: NEGATIVE
pH, UA: 5

## 2014-04-11 LAB — HEMOGLOBIN, FINGERSTICK: HEMOGLOBIN, FINGERSTICK: 14.2 g/dL (ref 12.0–16.0)

## 2014-04-11 MED ORDER — ESTRADIOL 0.0375 MG/24HR TD PTTW: MEDICATED_PATCH | TRANSDERMAL | Status: DC

## 2014-04-11 MED ORDER — PROGESTERONE MICRONIZED 100 MG PO CAPS: 100.0000 mg | ORAL_CAPSULE | Freq: Every day | ORAL | Status: DC

## 2014-04-11 NOTE — Patient Instructions (Signed)

## 2014-04-11 NOTE — Progress Notes (Signed)
Patient ID: Amy Burnett, female   DOB: 1951-01-09, 63 y.o.   MRN: [id number] 63 y.o. [id number] MarriedCaucasianF here for annual exam.   Frustrated with her weight.  Loves Yoga and Pilates.   Happy on hormonal therapy.  Does not like the patch she is on.  It is too big.   Some LE edema.   Fecal soiling.   Son had an MI at age 50 due to a viral illness.  Patient's last menstrual period was 01/04/2012.          Sexually active: No. female The current method of family planning is postmenopausal.    Exercising: No.  none. Smoker:  no  Health Maintenance: Pap:  04-06-13 wnl:neg HR HPV History of abnormal Pap:  no MMG:  12-14-13 heterogeneously dense/wnl:[facility] Colonoscopy:  02/2006 with [facility]:[id number].  Next colonoscopy due 02/2016.  Had biopsy of rectal nodule in December 2014 and hyperplastic polyp removed. BMD:   Years ago TDaP:  08/2008 Screening  Hb today: 14.2, Urine today: Neg.   reports that she quit smoking about 28 years ago. She has never used smokeless tobacco. She reports that she drinks about 4.8 ounces of alcohol per week. She reports that she does not use illicit drugs.  Past Medical History  Diagnosis Date  . Anxiety   . Restless leg   . History of kidney stones   . Panic attacks   . Sleep apnea     sleeps with C-pap  . Rectal nodule     Past Surgical History  Procedure Laterality Date  . Tonsillectomy and adenoidectomy      -age 87  . Hysteroscopy  01-28-09    resected fibroid(3cm) 2nd PMB--Dr. Joan Flores   . Hysteroscopy  09-23-09    resected myoma and polyp 2nd to PMB-- Dr. Joan Flores  . Lithotripsy  2003  . Colonoscopy  03/09/2006  . Eus N/A 06/01/2013    Procedure: LOWER ENDOSCOPIC ULTRASOUND (EUS);  Surgeon: Milus Banister, MD;  Location: [facility];  Service: Endoscopy;  Laterality: N/A;    Current Outpatient Prescriptions  Medication Sig Dispense Refill  . ALPRAZolam (XANAX) 0.5 MG tablet Take 0.5 mg by mouth daily as needed.       Marland Kitchen  estradiol (CLIMARA - DOSED IN MG/24 HR) 0.0375 mg/24hr patch Place 1 patch (0.0375 mg total) onto the skin once a week. Changes on monday  12 patch  3  . progesterone (PROMETRIUM) 100 MG capsule Take 1 capsule (100 mg total) by mouth daily.  90 capsule  3  . rOPINIRole (REQUIP) 1 MG tablet 1 tab twice daily and 2 tabs at hs  360 tablet  3  . sertraline (ZOLOFT) 100 MG tablet Take 200 mg by mouth every morning.       . Vitamin D, Ergocalciferol, (DRISDOL) 50000 UNITS CAPS Take 50,000 Units by mouth every 14 (fourteen) days. Every other week on Monday       No current facility-administered medications for this visit.    Family History  Problem Relation Age of Onset  . Breast cancer Mother 43  . Pancreatic cancer Mother 18    ROS:  Pertinent items are noted in HPI.  Otherwise, a comprehensive ROS was negative.  Exam:   BP 124/78  Pulse 88  Resp 14  Ht 5\' 6"  (1.676 m)  Wt 245 lb 3.2 oz (111.222 kg)  BMI 39.60 kg/m2  LMP 01/04/2012     Height: 5\' 6"  (167.6 cm)  Ht Readings from  Last 3 Encounters:  04/11/14 5\' 6"  (1.676 m)  06/01/13 5\' 5"  (1.651 m)  06/01/13 5\' 5"  (1.651 m)    General appearance: alert, cooperative and appears stated age Head: Normocephalic, without obvious abnormality, atraumatic Neck: no adenopathy, supple, symmetrical, trachea midline and thyroid normal to inspection and palpation Lungs: clear to auscultation bilaterally Breasts: normal appearance, no masses or tenderness, Inspection negative, No nipple retraction or dimpling, No nipple discharge or bleeding, No axillary or supraclavicular adenopathy Heart: regular rate and rhythm Abdomen: soft, non-tender; bowel sounds normal; no masses,  no organomegaly Extremities: extremities normal, atraumatic, no cyanosis.  2+ LE edema.  Skin: Skin color, texture, turgor normal. No rashes or lesions Lymph nodes: Cervical, supraclavicular, and axillary nodes normal. No abnormal inguinal nodes palpated Neurologic: Grossly  normal   Pelvic: External genitalia:  no lesions              Urethra:  normal appearing urethra with no masses, tenderness or lesions              Bartholins and Skenes: normal                 Vagina: normal appearing vagina with normal color and discharge, no lesions              Cervix: Small 4 mm red nonulcerated region at 3 o'clock              Pap taken: Yes.   Bimanual Exam:  Uterus:  normal size, contour, position, consistency, mobility, non-tender              Adnexa: normal adnexa and no mass, fullness, tenderness               Rectovaginal: Confirms               Anus:  normal sphincter tone, nodule noted 1 cm - 4 o'clock, nontender.  A:  Well Woman with normal exam Red area of cervix.  Normal pap last year.  Rectal nodule.  Benign biopsy.  Fecal incontinence.  LE edema.  Obesity.   P:   Mammogram yearly.  pap smear and HR HPV done. Encouraged exercise and calorie reduction - small steps for consistent good results! Will see PCP for evaluation RE LE edema.  Will continue on HRt for now.  Discussed risks and benefits including MI, stroke, PE, DVT, breast cancer.  Follow up with GI.  Metamucil for fecal incontinence.   return annually or prn  An After Visit Summary was printed and given to the patient.

## 2014-04-13 LAB — IPS PAP TEST WITH HPV

## 2014-04-16 ENCOUNTER — Encounter: Payer: Self-pay | Admitting: Obstetrics and Gynecology

## 2014-04-16 DIAGNOSIS — G4730 Sleep apnea, unspecified: Secondary | ICD-10-CM | POA: Insufficient documentation

## 2014-04-29 ENCOUNTER — Other Ambulatory Visit: Payer: Self-pay | Admitting: Obstetrics and Gynecology

## 2014-06-11 ENCOUNTER — Telehealth: Payer: Self-pay | Admitting: *Deleted

## 2014-06-11 NOTE — Telephone Encounter (Signed)
I called and left a message for the patient to call back and schedule an appointment with either NP or Dr. Brett Fairy to go over her sleep and medication concerns. The patient has not been seen since 04/05/13.   Message from Richmond Campbell:  Please call to discuss medicines. She is not sleeping.

## 2014-06-15 DIAGNOSIS — K6289 Other specified diseases of anus and rectum: Secondary | ICD-10-CM

## 2014-06-15 HISTORY — DX: Other specified diseases of anus and rectum: K62.89

## 2014-06-28 ENCOUNTER — Telehealth: Payer: Self-pay | Admitting: Gastroenterology

## 2014-07-02 NOTE — Telephone Encounter (Signed)
The pt was notified that Dr Ardis Hughs has her chart on his desk and he will review and as soon as available I will set her up for EUS.

## 2014-07-18 ENCOUNTER — Ambulatory Visit (INDEPENDENT_AMBULATORY_CARE_PROVIDER_SITE_OTHER): Payer: BLUE CROSS/BLUE SHIELD | Admitting: Neurology

## 2014-07-18 ENCOUNTER — Encounter: Payer: Self-pay | Admitting: Neurology

## 2014-07-18 VITALS — BP 134/77 | HR 89 | Resp 14 | Ht 65.0 in | Wt 245.0 lb

## 2014-07-18 DIAGNOSIS — Z9989 Dependence on other enabling machines and devices: Secondary | ICD-10-CM | POA: Insufficient documentation

## 2014-07-18 DIAGNOSIS — G4710 Hypersomnia, unspecified: Secondary | ICD-10-CM

## 2014-07-18 DIAGNOSIS — G4733 Obstructive sleep apnea (adult) (pediatric): Secondary | ICD-10-CM

## 2014-07-18 DIAGNOSIS — G2581 Restless legs syndrome: Secondary | ICD-10-CM

## 2014-07-18 MED ORDER — ROTIGOTINE 1 MG/24HR TD PT24: MEDICATED_PATCH | TRANSDERMAL | Status: DC

## 2014-07-18 NOTE — Patient Instructions (Signed)
Restless Legs Syndrome Restless legs syndrome is a movement disorder. It may also be called a sensorimotor disorder.  CAUSES  No one knows what specifically causes restless legs syndrome, but it tends to run in families. It is also more common in people with low iron, in pregnancy, in people who need dialysis, and those with nerve damage (neuropathy).Some medications may make restless legs syndrome worse.Those medications include drugs to treat high blood pressure, some heart conditions, nausea, colds, allergies, and depression. SYMPTOMS Symptoms include uncomfortable sensations in the legs. These leg sensations are worse during periods of inactivity or rest. They are also worse while sitting or lying down. Individuals that have the disorder describe sensations in the legs that feel like:  Pulling.  Drawing.  Crawling.  Worming.  Boring.  Tingling.  Pins and needles.  Prickling.  Pain. The sensations are usually accompanied by an overwhelming urge to move the legs. Sudden muscle jerks may also occur. Movement provides temporary relief from the discomfort. In rare cases, the arms may also be affected. Symptoms may interfere with going to sleep (sleep onset insomnia). Restless legs syndrome may also be related to periodic limb movement disorder (PLMD). PLMD is another more common motor disorder. It also causes interrupted sleep. The symptoms from PLMD usually occur most often when you are awake. TREATMENT  Treatment for restless legs syndrome is symptomatic. This means that the symptoms are treated.   Massage and cold compresses may provide temporary relief.  Walk, stretch, or take a cold or hot bath.  Get regular exercise and a good night's sleep.  Avoid caffeine, alcohol, nicotine, and medications that can make it worse.  Do activities that provide mental stimulation like discussions, needlework, and video games. These may be helpful if you are not able to walk or stretch. Some  medications are effective in relieving the symptoms. However, many of these medications have side effects. Ask your caregiver about medications that may help your symptoms. Correcting iron deficiency may improve symptoms for some patients. Document Released: 05/22/2002 Document Revised: 10/16/2013 Document Reviewed: 08/28/2010 ExitCare Patient Information 2015 [employer]. This information is not intended to replace advice given to you by your health care provider. Make sure you discuss any questions you have with your health care provider.  

## 2014-07-18 NOTE — Progress Notes (Signed)
[facility]             SLEEP MEDICINE CLINIC                                                               PATIENT: Amy Burnett DOB: 27-Jul-1950   REASON FOR VISIT: Followup for restless legs, CPAP followed by DME.      HISTORY OF PRESENT ILLNESS:Amy Burnett, 64 year old caucasian, right handed female patient of Dr. Melford Aase returns for followup.   The patient developed restless legs unrelated to blood loss, pregnancy, or any known Absorption difficulties. During her pregnancies she did not experience restless legs but developed a carpal tunnel syndrome in one pregnancy and in another had toxemia or HELP syndrome. She was treated with Requip at a lower dose but slowly has advanced to 4 mg daily she takes 1 tablet twice in daytime and 2 tablets at night. She has never been on the extended release dose of Requip. Also on Prometrium progesterone supplement on a Vivelle-Dot and takes Xanax as needed on a when necessary basis. She is on Zoloft 2 of  100 mg tablets every morning for OCD. She was diagnosed with low Ferritin, and has not taken oral iron after a singe iron infusion. This took place 8 years ago.   Chest with Christmas 2015 the patient had an exacerbation of sleep problems and restless legs which brings her also to the practice today. Her overall sleep time overnight at the time was probably less than 6 hours. Maybe closer to 5, she has no nocturia breaks reported. She has trouble falling asleep due to the restless legs restless legs however seems not to wake her during her sleep at night. Sleep is not extremely fragmented right now. Daytime sleepiness however has been a problem as the intake of her Requip dose of 1 mg correlates with excessive sleepiness occurring about 30-45 minutes later. She is currently on Ropinirole 4 mg total dose. She says she was in  2013 diagnosed with sleep apnea and is using CPAP at 4 cm. She was [facility] at [facility], by Dr. Moshe Salisbury.   She gets  no regular exercise. She knows she needs to lose some weight.      REVIEW OF SYSTEMS: Full 14 system review of systems performed and notable only for:  EDS, Epworth 18 ,   RLS, obeisty, daytime micro-sleeps after Requip, no hallucinations, but visual misperceptions. .    ALLERGIES: No Known Allergies  HOME MEDICATIONS: Outpatient Prescriptions Prior to Visit  Medication Sig Dispense Refill  . ALPRAZolam (XANAX) 0.5 MG tablet Take 0.5 mg by mouth daily as needed.     . progesterone (PROMETRIUM) 100 MG capsule Take 1 capsule (100 mg total) by mouth daily. 30 capsule 11  . rOPINIRole (REQUIP) 1 MG tablet 1 tab twice daily and 2 tabs at hs 360 tablet 3  . sertraline (ZOLOFT) 100 MG tablet Take 200 mg by mouth every morning.     . Vitamin D, Ergocalciferol, (DRISDOL) 50000 UNITS CAPS Take 50,000 Units by mouth every 14 (fourteen) days. Every other week on Monday    . estradiol (MINIVELLE) 0.0375 MG/24HR Apply one patch to skin twice weekly. 8 patch 11   No facility-administered medications prior to visit.  PAST MEDICAL HISTORY: Past Medical History  Diagnosis Date  . Anxiety   . Restless leg   . History of kidney stones   . Panic attacks   . Sleep apnea     sleeps with C-pap  . Rectal nodule     PAST SURGICAL HISTORY: Past Surgical History  Procedure Laterality Date  . Tonsillectomy and adenoidectomy      -age 77  . Hysteroscopy  01-28-09    resected fibroid(3cm) 2nd PMB--Dr. Joan Flores   . Hysteroscopy  09-23-09    resected myoma and polyp 2nd to PMB-- Dr. Joan Flores  . Lithotripsy  2003  . Colonoscopy  03/09/2006  . Eus N/A 06/01/2013    Procedure: LOWER ENDOSCOPIC ULTRASOUND (EUS);  Surgeon: Milus Banister, MD;  Location: [facility];  Service: Endoscopy;  Laterality: N/A;    FAMILY HISTORY: Family History  Problem Relation Age of Onset  . Breast cancer Mother 79  . Pancreatic cancer Mother 36    SOCIAL HISTORY: History   Social History  . Marital Status:  Married    Spouse Name: Amy Burnett    Number of Children: 2  . Years of Education: college   Occupational History  .      full time works for her self   Social History Main Topics  . Smoking status: Former Smoker    Quit date: 06/15/1985  . Smokeless tobacco: Never Used  . Alcohol Use: 4.8 oz/week    8 Glasses of wine per week     Comment: with dinner  . Drug Use: No  . Sexual Activity: No   Other Topics Concern  . Not on file   Social History Narrative   Patient lives at home with her husband Amy Burnett)    Patient works full time   Education- college   Right handed   Caffeine- diet coke three daily     PHYSICAL EXAM  Filed Vitals:   07/18/14 1048  BP: 134/77  Pulse: 89  Resp: 14  Height: 5\' 5"  (1.651 m)  Weight: 245 lb (111.131 kg)   Body mass index is 40.77 kg/(m^2).  General: The patient is awake, alert and appears not in acute distress. The patient is well groomed. Head: Normocephalic, atraumatic. Neck is supple. Mallampati 4, neck circumference: 17 inches  Cardiovascular:  Regular rate and rhythm , without  murmurs or carotid bruit, and without distended neck veins. Respiratory: Lungs are clear to auscultation. Skin:  Without evidence of edema, or rash Trunk: BMI is severely  elevated and patient  has normal posture.   Neurologic exam : The patient is awake and alert, oriented to place and time.  Memory subjective described as intact. There is a normal attention span & concentration ability.  Speech is fluent without dysarthria, dysphonia or aphasia. Mood and affect are appropriate.  Cranial nerves: Pupils are equal and briskly reactive to light. Funduscopic exam without  evidence of pallor or edema. Extraocular movements  in vertical and horizontal planes intact and without nystagmus. Visual fields by finger perimetry are intact. Hearing to finger rub intact.  Facial sensation intact to fine touch. Facial motor strength is symmetric and tongue and uvula move  midline.  Motor exam:  Normal tone and normal muscle bulk and symmetric normal strength in all extremities.  Sensory:  Fine touch, pinprick and vibration were tested in all extremities. Proprioception is  normal.  Coordination: Rapid alternating movements in the fingers/hands is tested and normal. Finger-to-nose maneuver tested and normal without evidence of  ataxia, dysmetria or tremor.  Gait and station: Patient walks without assistive device and is able and assisted stool climb up to the exam table. Strength within normal limits. Stance is stable and normal. Tandem gait is intact, patient turns with 3 steps.  Deep tendon reflexes: in the  upper and lower extremities are symmetric and intact.  Patella 3 plus brisk. Babinski maneuver response is  downgoing.   Assessment:  After physical and neurologic examination, review of laboratory studies, imaging, neurophysiology testing and pre-existing records, assessment established after 35 minute of visit time, more than 50% of this face a to face  time is spent in education, explanation of differential diagnosis and treatment options. .  Plan:  Treatment plan and additional workup will be reviewed under Problem List.  This is my first face-to-face visit with the patient in over 8 years.   Mrs. Norell has a long-standing history of restless legs and was initially helped by a iron infusion in 2008.  Looking back at her history I think we should pursue further testing to see for her total iron capacity today as were her ferritin levels are and to see if this will allow [place] to supplement her either orally or by IV. This my hope is to reduce her Requip intake by boosting her iron binding capacity.   Also her Epworth sleepiness score is very high at 18 points for a current CPAP user. CPAP is set at 4 cm water and presumably treats UARS, not OSA at this pressure.    Part of this excessive daytime sleepiness can be attributed to dopaminergic agonist a  medication group that Requip belongs to. Alternatives to Requip are extended release Requip which causes not nearly as much daytime sleepiness and a Neupro patch which is another dopaminergic witnessed in a transdermal application taken once a day. This has helped many of my patients on long-distance flights travels or during dialysis sessions.             ASSESSMENT AND PLAN  64 y.o. year old female here to followup for restless legs.  Currently on Requip total dose  4 mg daily. This causes daytime sleepiness.    [facility] [address], [place], [place] [address] [phone]

## 2014-07-20 LAB — IRON AND TIBC
IRON SATURATION: 23 % (ref 15–55)
IRON: 67 ug/dL (ref 27–139)
TIBC: 287 ug/dL (ref 250–450)
UIBC: 220 ug/dL (ref 118–369)

## 2014-07-20 LAB — FERRITIN: Ferritin: 207 ng/mL — ABNORMAL HIGH (ref 15–150)

## 2014-07-20 LAB — METHYLMALONIC ACID, SERUM: Methylmalonic Acid: 200 nmol/L (ref 0–378)

## 2014-07-25 NOTE — Progress Notes (Signed)
Quick Note:  I gave results of pts labs to her VM. (labs looked good). She is to call back if questions. ______

## 2014-07-26 ENCOUNTER — Encounter: Payer: Self-pay | Admitting: Neurology

## 2014-08-07 ENCOUNTER — Encounter: Payer: Self-pay | Admitting: Neurology

## 2014-08-09 ENCOUNTER — Other Ambulatory Visit: Payer: Self-pay

## 2014-08-09 ENCOUNTER — Telehealth: Payer: Self-pay | Admitting: *Deleted

## 2014-08-09 DIAGNOSIS — G4733 Obstructive sleep apnea (adult) (pediatric): Secondary | ICD-10-CM

## 2014-08-09 DIAGNOSIS — G4710 Hypersomnia, unspecified: Secondary | ICD-10-CM

## 2014-08-09 DIAGNOSIS — G2581 Restless legs syndrome: Secondary | ICD-10-CM

## 2014-08-09 DIAGNOSIS — Z9989 Dependence on other enabling machines and devices: Secondary | ICD-10-CM

## 2014-08-09 MED ORDER — ROTIGOTINE 1 MG/24HR TD PT24: MEDICATED_PATCH | TRANSDERMAL | Status: DC

## 2014-08-09 MED ORDER — ROPINIROLE HCL 1 MG PO TABS: ORAL_TABLET | ORAL | Status: DC

## 2014-08-09 MED ORDER — ROTIGOTINE 1 MG/24HR TD PT24: 2.0000 mg | MEDICATED_PATCH | TRANSDERMAL | Status: DC

## 2014-08-09 MED ORDER — ROTIGOTINE 2 MG/24HR TD PT24: 1.0000 | MEDICATED_PATCH | Freq: Every day | TRANSDERMAL | Status: DC

## 2014-08-09 NOTE — Telephone Encounter (Signed)
Neupro 2mg  added to med list

## 2014-08-09 NOTE — Telephone Encounter (Signed)
Spoke with Amy Burnett, who is very happy with the patch effect, no restless legs in daytime and no drowsiness, she uses 2 MG patch in AM and an oral dose at bedtime. Feeling well. I will prescribe the 2 mg Neupro patch  for her daily use.

## 2014-08-09 NOTE — Telephone Encounter (Signed)
Patient is the calling about the dosage on the Rotigotine. She is not sure if she is taking it correctly. Please call the patient and advise. The patient has went up to 4 patches a day.

## 2014-09-03 ENCOUNTER — Encounter: Payer: Self-pay | Admitting: Neurology

## 2014-09-03 DIAGNOSIS — G2581 Restless legs syndrome: Secondary | ICD-10-CM

## 2014-09-03 DIAGNOSIS — G4710 Hypersomnia, unspecified: Secondary | ICD-10-CM

## 2014-09-03 DIAGNOSIS — Z9989 Dependence on other enabling machines and devices: Secondary | ICD-10-CM

## 2014-09-03 DIAGNOSIS — G4733 Obstructive sleep apnea (adult) (pediatric): Secondary | ICD-10-CM

## 2014-09-05 MED ORDER — ROTIGOTINE 2 MG/24HR TD PT24: 1.0000 | MEDICATED_PATCH | Freq: Every day | TRANSDERMAL | Status: DC

## 2014-09-05 MED ORDER — ROTIGOTINE 1 MG/24HR TD PT24: 2.0000 mg | MEDICATED_PATCH | TRANSDERMAL | Status: DC

## 2014-09-05 NOTE — Telephone Encounter (Signed)
Dear Mrs. Amy Burnett,   I hope that you found the prescriptions by now have arrived at your pharmacy at 2 mg patch of 1 mg patch and an oral dose.CD

## 2014-10-24 ENCOUNTER — Telehealth: Payer: Self-pay | Admitting: Neurology

## 2014-10-24 ENCOUNTER — Ambulatory Visit (INDEPENDENT_AMBULATORY_CARE_PROVIDER_SITE_OTHER): Payer: BLUE CROSS/BLUE SHIELD | Admitting: Neurology

## 2014-10-24 ENCOUNTER — Encounter: Payer: Self-pay | Admitting: Neurology

## 2014-10-24 VITALS — BP 131/81 | HR 81 | Resp 18 | Ht 65.0 in | Wt 241.0 lb

## 2014-10-24 DIAGNOSIS — G4733 Obstructive sleep apnea (adult) (pediatric): Secondary | ICD-10-CM | POA: Diagnosis not present

## 2014-10-24 DIAGNOSIS — Z9989 Dependence on other enabling machines and devices: Secondary | ICD-10-CM

## 2014-10-24 DIAGNOSIS — G2581 Restless legs syndrome: Secondary | ICD-10-CM | POA: Diagnosis not present

## 2014-10-24 DIAGNOSIS — G4710 Hypersomnia, unspecified: Secondary | ICD-10-CM | POA: Diagnosis not present

## 2014-10-24 MED ORDER — ROPINIROLE HCL 1 MG PO TABS: 1.0000 mg | ORAL_TABLET | Freq: Every day | ORAL | Status: DC

## 2014-10-24 MED ORDER — ROTIGOTINE 2 MG/24HR TD PT24: MEDICATED_PATCH | TRANSDERMAL | Status: DC

## 2014-10-24 NOTE — Progress Notes (Signed)
[facility]             SLEEP MEDICINE CLINIC                                                               PATIENT: Amy Burnett DOB: [birth year]   REASON FOR VISIT: Followup for restless legs, CPAP followed by DME.      HISTORY OF PRESENT ILLNESS:Amy Burnett, 64 year old caucasian, right handed female patient of Amy Burnett returns for followup.   The patient developed restless legs unrelated to blood loss, pregnancy, or any known Absorption difficulties. During her pregnancies she did not experience restless legs but developed a carpal tunnel syndrome in one pregnancy and in another had toxemia or HELP syndrome.   She was treated with Requip since 2006 and had an iron infusion., first at a lower dose but since than slowly has advanced to 4 mg daily -she takes 1 tablet twice in daytime and 2 tablets at night.  She has never been on the extended release dose of Requip. Also on Prometrium progesterone supplement on a Vivelle-Dot and takes Xanax as needed on a when necessary basis.  She is on Zoloft 2 of  100 mg tablets every morning for OCD.  She was diagnosed with low Ferritin, and has not taken oral iron after a singe iron infusion. This took place 8 years ago.   Chest with Christmas 2015 the patient had an exacerbation of sleep problems and restless legs which brings her also to the practice today. Her overall sleep time overnight at the time was probably less than 6 hours. Maybe closer to 5, she has no nocturia breaks reported. She has trouble falling asleep due to the restless legs restless legs however these seem not to wake her during her sleep at night.  Sleep is not extremely fragmented right now. Daytime sleepiness however has been a problem as the intake of her Requip dose of 1 mg correlates with excessive sleepiness occurring about 30-45 minutes later. She is currently on Ropinirole 4 mg total dose.She says she was in 2013 diagnosed with sleep apnea and is using  CPAP at 4 cm. She was evaluated at [facility], by Amy Burnett.  She gets no regular exercise. She knows she needs to lose some weight.    Several history from 10-24-14, Amy Burnett restless leg syndrome has been well controlled under the use of 2 new poor patches and when necessary use of an additional milligram of oral Requip. She recently could fly for toward a half hours without onset of symptoms which she has not been able to do in the past. She is able to sustain sound sleep at night. Her Epworth Sleepiness Scale is endorsed at 9 points and her fatigue severity score at 23 points. She does not feel depressed. She does not report any micro-sleep attacks now. She would be able to drive she is able to follow conversations without falling asleep. In addition the patient still has a condition of obstructive sleep apnea and is using CPAP. She has been diagnosed by Amy Burnett  and is followed by her sleep clinic on  [facility], Amy Burnett was her sleep technologist.     REVIEW OF SYSTEMS: Full 14 system review of systems  performed and notable only for:  EDS, Epworth down from 18 to 9 points  ,  RLS, obesity, but peripheral visual misperceptions ( drug induced ) .   No longer any microsleeps,   ALLERGIES: No Known Allergies  HOME MEDICATIONS: Outpatient Prescriptions Prior to Visit  Medication Sig Dispense Refill  . ALPRAZolam (XANAX) 0.5 MG tablet Take 0.5 mg by mouth daily as needed.     . sertraline (ZOLOFT) 100 MG tablet Take 200 mg by mouth every morning.     Marland Kitchen rOPINIRole (REQUIP) 1 MG tablet 1 tab twice daily and 2 tabs at hs 360 tablet 3  . Rotigotine 1 MG/24HR PT24 Place 2 patches (2 mg total) onto the skin 1 day or 1 dose. Apply to skin every 24 hours for severe RLS, hypersomnia on oral requip. 60 patch 2  . progesterone (PROMETRIUM) 100 MG capsule Take 1 capsule (100 mg total) by mouth daily. (Patient not taking: Reported on 10/24/2014) 30 capsule 11  . Vitamin D, Ergocalciferol,  (DRISDOL) 50000 UNITS CAPS Take 50,000 Units by mouth every 14 (fourteen) days. Every other week on Monday    . rotigotine (NEUPRO) 2 MG/24HR Place 1 patch onto the skin daily. Apply to skin every 24 hours for severe RLS, hypersomnia (Patient not taking: Reported on 10/24/2014) 30 patch 3   No facility-administered medications prior to visit.    PAST MEDICAL HISTORY: Past Medical History  Diagnosis Date  . Anxiety   . Restless leg   . History of kidney stones   . Panic attacks   . Sleep apnea     sleeps with C-pap  . Rectal nodule     PAST SURGICAL HISTORY: Past Surgical History  Procedure Laterality Date  . Tonsillectomy and adenoidectomy      -age 74  . Hysteroscopy  01-28-09    resected fibroid(3cm) 2nd PMB--Dr. Joan Flores   . Hysteroscopy  09-23-09    resected myoma and polyp 2nd to PMB-- Dr. Joan Flores  . Lithotripsy  2003  . Colonoscopy  03/09/2006  . Eus N/A 06/01/2013    Procedure: LOWER ENDOSCOPIC ULTRASOUND (EUS);  Surgeon: Milus Banister, MD;  Location: [facility];  Service: Endoscopy;  Laterality: N/A;    FAMILY HISTORY: Family History  Problem Relation Age of Onset  . Breast cancer Mother 45  . Pancreatic cancer Mother 13    SOCIAL HISTORY: History   Social History  . Marital Status: Married    Spouse Name: Amy Burnett  . Number of Children: 2  . Years of Education: college   Occupational History  .      full time works for her self   Social History Main Topics  . Smoking status: Former Smoker    Quit date: 06/15/1985  . Smokeless tobacco: Never Used  . Alcohol Use: 4.8 oz/week    8 Glasses of wine per week     Comment: with dinner  . Drug Use: No  . Sexual Activity: No   Other Topics Concern  . Not on file   Social History Narrative   Patient lives at home with her husband Amy Burnett)    Patient works full time   Education- college   Right handed   Caffeine- diet coke three daily     PHYSICAL EXAM  Filed Vitals:   10/24/14 1028  BP: 131/81    Pulse: 81  Resp: 18  Height: 5\' 5"  (1.651 m)  Weight: 241 lb (109.317 kg)   Body mass index is 40.1 kg/(m^2).  General: The patient is awake, alert and appears not in acute distress. The patient is well groomed. Head: Normocephalic, atraumatic. Neck is supple. Mallampati 4, neck circumference: 16.5  inches  Cardiovascular:  Regular rate and rhythm , without  murmurs or carotid bruit, and without distended neck veins. Respiratory: Lungs are clear to auscultation. Skin:  Without evidence of edema, or rash Trunk: BMI is severely  elevated and patient  has normal posture.   Neurologic exam : The patient is awake and alert, oriented to place and time.  Memory subjective described as intact. There is a normal attention span & concentration ability.  Speech is fluent without dysarthria, dysphonia or aphasia. Mood and affect are appropriate.  Cranial nerves: No change in taste or smell. Pupils are equal and briskly reactive to light. Extraocular movements  in vertical and horizontal planes intact and without nystagmus. Visual fields by finger perimetry are intact.Hearing to finger rub intact. Facial sensation intact to fine touch.  Facial motor strength is symmetric and tongue and uvula move midline.  Motor exam:  Normal tone and muscle bulk and symmetric strength in all extremities.  Sensory:  Fine touch, pinprick and vibration were  Normal. No evidence of neuropathy!  Coordination: Rapid alternating movements in the fingers/hands is tested and normal. Finger-to-nose maneuver tested and normal without evidence of ataxia, dysmetria or tremor.  Gait and station: Patient walks without assistive device and is able and assisted stool climb up to the exam table. Strength within normal limits. Stance is stable and normal. Tandem gait is intact, patient turns with 3 steps.  Deep tendon reflexes: in the  upper and lower extremities are symmetric and intact.  Patella 3 plus brisk. Babinski maneuver  response is  downgoing.   Assessment:  After physical and neurologic examination, review of laboratory studies, imaging, neurophysiology testing and pre-existing records, assessment established after 15 minute of visit time, more than 50% of this face a to face  time is spent in education, explanation of differential diagnosis and treatment options. .  Plan:  Treatment plan and additional workup : refilled patches and oral Requip.      RV 15 minutes , face-to-face visit with the patient of over 8 years.   Mrs. Manthe has a long-standing history of restless legs and was initially helped by a iron infusion in 2008.  Looking back at her history I think we should pursue further testing to see for her total iron capacity today as were her ferritin levels are and to see if this will allow [place] to supplement her either orally or by IV. This my hope is to reduce her Requip intake by boosting her iron binding capacity.  The patient's ferritin level returned at 207 mmol and her iron capacity total iron binding capacity were also within normal limits. I would like for her to take a multivitamin mineral at multivitamin makes she does not have to adhere to a daily just to maintain her iron at current levels. She would not be a candidate to have IV iron infusions.   ASSESSMENT AND PLAN  64 y.o. year old female here to followup for restless legs.  Currently on  2 mg patch  Two patches, and Requip  Oral dose  Prn 1 mg . Less daytime  daytime sleepiness with the patches that she had was 4 mg of oral Requip a day. She had micro-sleep attacks on oral Requip at that dose. She also still wasn't able to sustain a longer train ride or  plane flight etc. she recently flew for about 2-1/2 hours and had no symptoms.   [place], [facility] [address], [place], [place] [address] [phone]

## 2014-10-24 NOTE — Telephone Encounter (Signed)
Amy Burnett from [place] [phone]) has a question regarding the Rx. rOPINIRole (REQUIP) 1 MG tablet. Please call and advise.

## 2014-10-24 NOTE — Telephone Encounter (Signed)
I called back.  They would like to verify the dose on Requip.  They have 2 sets of instructions, one at bedtime and one twice daily and two at bedtime.  I will be happy to call back, but wanted to ensure the appropriate info is relayed.  Could you please verify current dose?  Thank you.

## 2014-10-24 NOTE — Patient Instructions (Signed)
Restless Legs Syndrome Restless legs syndrome is a movement disorder. It may also be called a sensorimotor disorder.  CAUSES  No one knows what specifically causes restless legs syndrome, but it tends to run in families. It is also more common in people with low iron, in pregnancy, in people who need dialysis, and those with nerve damage (neuropathy).Some medications may make restless legs syndrome worse.Those medications include drugs to treat high blood pressure, some heart conditions, nausea, colds, allergies, and depression. SYMPTOMS Symptoms include uncomfortable sensations in the legs. These leg sensations are worse during periods of inactivity or rest. They are also worse while sitting or lying down. Individuals that have the disorder describe sensations in the legs that feel like:  Pulling.  Drawing.  Crawling.  Worming.  Boring.  Tingling.  Pins and needles.  Prickling.  Pain. The sensations are usually accompanied by an overwhelming urge to move the legs. Sudden muscle jerks may also occur. Movement provides temporary relief from the discomfort. In rare cases, the arms may also be affected. Symptoms may interfere with going to sleep (sleep onset insomnia). Restless legs syndrome may also be related to periodic limb movement disorder (PLMD). PLMD is another more common motor disorder. It also causes interrupted sleep. The symptoms from PLMD usually occur most often when you are awake. TREATMENT  Treatment for restless legs syndrome is symptomatic. This means that the symptoms are treated.   Massage and cold compresses may provide temporary relief.  Walk, stretch, or take a cold or hot bath.  Get regular exercise and a good night's sleep.  Avoid caffeine, alcohol, nicotine, and medications that can make it worse.  Do activities that provide mental stimulation like discussions, needlework, and video games. These may be helpful if you are not able to walk or stretch. Some  medications are effective in relieving the symptoms. However, many of these medications have side effects. Ask your caregiver about medications that may help your symptoms. Correcting iron deficiency may improve symptoms for some patients. Document Released: 05/22/2002 Document Revised: 10/16/2013 Document Reviewed: 08/28/2010 ExitCare Patient Information 2015 [employer]. This information is not intended to replace advice given to you by your health care provider. Make sure you discuss any questions you have with your health care provider.  

## 2014-10-25 MED ORDER — ROPINIROLE HCL 1 MG PO TABS: 1.0000 mg | ORAL_TABLET | Freq: Every day | ORAL | Status: DC

## 2014-10-25 NOTE — Telephone Encounter (Signed)
She takes 2 patches of 2 mg each daily +1 mg oral Requip as needed.

## 2014-10-25 NOTE — Telephone Encounter (Signed)
I called back.  Spoke with Ilona Sorrel.  Relayed providers message.  He has noted the file.

## 2014-11-20 ENCOUNTER — Other Ambulatory Visit: Payer: Self-pay

## 2014-11-20 ENCOUNTER — Telehealth: Payer: Self-pay | Admitting: Gastroenterology

## 2014-11-20 DIAGNOSIS — K6289 Other specified diseases of anus and rectum: Secondary | ICD-10-CM

## 2014-11-20 NOTE — Telephone Encounter (Signed)
Pt has been scheduled for EUS 12/06/14 730 am

## 2014-11-20 NOTE — Telephone Encounter (Signed)
Recall on my desk, I will call pt as soon as appt has been scheduled.  Pt is aware

## 2014-11-21 NOTE — Telephone Encounter (Signed)
Pt instructions have been mailed to the pt message left for pt to return call for verbal instructions.

## 2014-11-21 NOTE — Telephone Encounter (Signed)
Left message on machine to call back  

## 2014-11-22 NOTE — Telephone Encounter (Signed)
Left message on machine to call back  

## 2014-11-23 NOTE — Telephone Encounter (Signed)
EUS scheduled, pt instructed and medications reviewed.  Patient instructions mailed to home.  Patient to call with any questions or concerns.  

## 2014-12-03 ENCOUNTER — Encounter (HOSPITAL_COMMUNITY): Payer: Self-pay | Admitting: *Deleted

## 2014-12-05 ENCOUNTER — Encounter (HOSPITAL_COMMUNITY): Payer: Self-pay | Admitting: Anesthesiology

## 2014-12-05 NOTE — Anesthesia Preprocedure Evaluation (Addendum)
Anesthesia Evaluation  Patient identified by MRN, date of birth, ID band Patient awake    Reviewed: Allergy & Precautions, NPO status , Patient's Chart, lab work & pertinent test results  Airway Mallampati: II  TM Distance: >3 FB Neck ROM: Full    Dental no notable dental hx.    Pulmonary sleep apnea and Continuous Positive Airway Pressure Ventilation , former smoker,  breath sounds clear to auscultation  Pulmonary exam normal       Cardiovascular negative cardio ROS Normal cardiovascular examRhythm:Regular Rate:Normal     Neuro/Psych Anxiety negative neurological ROS     GI/Hepatic negative GI ROS, Neg liver ROS,   Endo/Other  Morbid obesity  Renal/GU negative Renal ROS  negative genitourinary   Musculoskeletal negative musculoskeletal ROS (+)   Abdominal (+) + obese,   Peds negative pediatric ROS (+)  Hematology negative hematology ROS (+)   Anesthesia Other Findings   Reproductive/Obstetrics negative OB ROS                            Anesthesia Physical Anesthesia Plan  ASA: III  Anesthesia Plan: MAC   Post-op Pain Management:    Induction: Intravenous  Airway Management Planned: Natural Airway  Additional Equipment:   Intra-op Plan:   Post-operative Plan:   Informed Consent: I have reviewed the patients History and Physical, chart, labs and discussed the procedure including the risks, benefits and alternatives for the proposed anesthesia with the patient or authorized representative who has indicated his/her understanding and acceptance.   Dental advisory given  Plan Discussed with: CRNA  Anesthesia Plan Comments: (At 0705, I am informed that Ms. Hoes will not need anesthesia services today. IV sedation only.)       Anesthesia Quick Evaluation

## 2014-12-06 ENCOUNTER — Encounter (HOSPITAL_COMMUNITY): Payer: Self-pay | Admitting: Anesthesiology

## 2014-12-06 ENCOUNTER — Encounter (HOSPITAL_COMMUNITY): Payer: Self-pay | Admitting: *Deleted

## 2014-12-06 ENCOUNTER — Encounter (HOSPITAL_COMMUNITY)
Admission: RE | Disposition: A | Discharge status: Home / Self Care | Payer: Self-pay | Source: Ambulatory Visit | Attending: Gastroenterology

## 2014-12-06 ENCOUNTER — Ambulatory Visit (HOSPITAL_COMMUNITY)
Admission: RE | Admit: 2014-12-06 | Discharge: 2014-12-06 | Disposition: A | Discharge status: Home / Self Care | Payer: BLUE CROSS/BLUE SHIELD | Source: Ambulatory Visit | Attending: Gastroenterology | Admitting: Gastroenterology

## 2014-12-06 ENCOUNTER — Telehealth: Payer: Self-pay

## 2014-12-06 DIAGNOSIS — Z9989 Dependence on other enabling machines and devices: Secondary | ICD-10-CM | POA: Insufficient documentation

## 2014-12-06 DIAGNOSIS — K6289 Other specified diseases of anus and rectum: Secondary | ICD-10-CM

## 2014-12-06 DIAGNOSIS — Z87891 Personal history of nicotine dependence: Secondary | ICD-10-CM | POA: Diagnosis not present

## 2014-12-06 DIAGNOSIS — G4730 Sleep apnea, unspecified: Secondary | ICD-10-CM | POA: Diagnosis not present

## 2014-12-06 DIAGNOSIS — G2581 Restless legs syndrome: Secondary | ICD-10-CM | POA: Insufficient documentation

## 2014-12-06 HISTORY — PX: EUS: SHX5427

## 2014-12-06 SURGERY — ULTRASOUND, LOWER GI TRACT, ENDOSCOPIC
Anesthesia: Monitor Anesthesia Care

## 2014-12-06 MED ORDER — FENTANYL CITRATE (PF) 100 MCG/2ML IJ SOLN
INTRAMUSCULAR | Status: DC | PRN
  Administered 2014-12-06 (×2): 25 ug via INTRAVENOUS

## 2014-12-06 MED ORDER — PROPOFOL 10 MG/ML IV BOLUS
INTRAVENOUS | Status: AC
  Filled 2014-12-06: qty 20

## 2014-12-06 MED ORDER — FENTANYL CITRATE (PF) 100 MCG/2ML IJ SOLN
INTRAMUSCULAR | Status: AC
  Filled 2014-12-06: qty 2

## 2014-12-06 MED ORDER — MIDAZOLAM HCL 5 MG/ML IJ SOLN
INTRAMUSCULAR | Status: AC
  Filled 2014-12-06: qty 2

## 2014-12-06 MED ORDER — SODIUM CHLORIDE 0.9 % IV SOLN
INTRAVENOUS | Status: DC
  Administered 2014-12-06: 08:00:00 via INTRAVENOUS

## 2014-12-06 MED ORDER — MIDAZOLAM HCL 10 MG/2ML IJ SOLN
INTRAMUSCULAR | Status: DC | PRN
Start: 2014-12-06 — End: 2014-12-06
  Administered 2014-12-06 (×2): 2 mg via INTRAVENOUS

## 2014-12-06 MED ORDER — LIDOCAINE HCL (CARDIAC) 20 MG/ML IV SOLN
INTRAVENOUS | Status: AC
  Filled 2014-12-06: qty 5

## 2014-12-06 NOTE — H&P (Signed)
  HPI: This is a woman with rectal lesion, noted 2007, subepthilelial. EUS 05/2013 with tunnel biopsies were non-diagnostic.  Recommended repeat in 12 months.     Past Medical History  Diagnosis Date  . Anxiety   . Restless leg   . History of kidney stones   . Panic attacks   . Sleep apnea     sleeps with C-pap  . Rectal nodule     Past Surgical History  Procedure Laterality Date  . Tonsillectomy and adenoidectomy      -age 64  . Hysteroscopy  01-28-09    resected fibroid(3cm) 2nd PMB--Dr. Joan Flores   . Hysteroscopy  09-23-09    resected myoma and polyp 2nd to PMB-- Dr. Joan Flores  . Lithotripsy  2003  . Colonoscopy  03/09/2006  . Eus N/A 06/01/2013    Procedure: LOWER ENDOSCOPIC ULTRASOUND (EUS);  Surgeon: Milus Banister, MD;  Location: [facility];  Service: Endoscopy;  Laterality: N/A;    Current Facility-Administered Medications  Medication Dose Route Frequency Provider Last Rate Last Dose  . 0.9 %  sodium chloride infusion   Intravenous Continuous Milus Banister, MD        Allergies as of 11/20/2014  . (No Known Allergies)    Family History  Problem Relation Age of Onset  . Breast cancer Mother 110  . Pancreatic cancer Mother 32    History   Social History  . Marital Status: Married    Spouse Name: [name]  . Number of Children: 2  . Years of Education: college   Occupational History  .      full time works for her self   Social History Main Topics  . Smoking status: Former Smoker    Quit date: 06/15/1985  . Smokeless tobacco: Never Used  . Alcohol Use: 4.8 oz/week    8 Glasses of wine per week     Comment: with dinner  . Drug Use: No  . Sexual Activity: No   Other Topics Concern  . Not on file   Social History Narrative   Patient lives at home with her husband [name])    Patient works full time   Education- college   Right handed   Caffeine- diet coke three daily     Physical Exam: BP 159/94 mmHg  Temp(Src) 97.7 F (36.5 C) (Oral)  Resp 13   Ht 5\' 7"  (1.702 m)  Wt 240 lb (108.863 kg)  BMI 37.58 kg/m2  SpO2 96%  LMP 01/04/2012 Constitutional: generally well-appearing Psychiatric: alert and oriented x3 Abdomen: soft, nontender, nondistended, no obvious ascites, no peritoneal signs, normal bowel sounds   Assessment and plan: 64 y.o. female with rectal subepith lesion, here for repeat EUS  *EUS today   Owens Loffler, MD [facility] 12/06/2014, 7:14 AM

## 2014-12-06 NOTE — Op Note (Signed)
[facility] [place], [address]   ENDOSCOPIC ULTRASOUND PROCEDURE REPORT  PATIENT: Amy Burnett, Amy Burnett  MR#: [id number] BIRTHDATE: 1951/03/04  GENDER: female ENDOSCOPIST: Milus Banister, MD PROCEDURE DATE:  12/06/2014 PROCEDURE:   Lower EUS ASA CLASS:      Class II INDICATIONS:   1.  subepithelial rectal nodule (noted 2007, EUS 2015 9 by 53mm lesion communicating with MP, tunnel biopsy suggested HP polyp. MEDICATIONS: Fentanyl 50 mcg IV and Versed 4 mg IV  DESCRIPTION OF PROCEDURE:   After the risks benefits and alternatives of the procedure were  explained, informed consent was obtained. The patient was then placed in the left, lateral, decubitus postion and IV sedation was administered. Throughout the procedure, the patients blood pressure, pulse and oxygen saturations were monitored continuously.  Under direct visualization, the Pentax Radial EUS [id number]  endoscope was introduced through the anusm  and advanced to the sigmoid colon . Water was used as necessary to provide an acoustic interface.  Upon completion of the imaging, water was removed and the patient was sent to the recovery room in satisfactory condition.  Sigmoidoscopic findings: 1. Small subepithelial lesion nearly abuting anal verge, measured about 1cm endoscopically 2. Otherwise normal examination  EUS findings: 1. The lesion above correlated with a hypoechoic mass that measures 1.2 by 1.3cm and appeared to communicate with the muscularis propria layer of the distal rectal wall.  ENDOSCOPIC IMPRESSION: The distal rectal lesion appears to have grown slightly since last EUS about 18 months ago.  Tunnel biopsies at that point suggested hyperplastic polyp but given it's subepithelial nature I am not sure that is indeed true.  I'm suspicious that it is a carcinoid or small GIST lesion.  RECOMMENDATIONS: I discussed with Dr. Carlean Purl and the patient and we agree to have her meet  general surgery to consider transanal resection.  _______________________________ eSignedMilus Banister, MD 12/06/2014 7:52 AM   CC: Silvano Rusk, MD

## 2014-12-06 NOTE — Discharge Instructions (Signed)

## 2014-12-06 NOTE — Telephone Encounter (Signed)
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 [name] at [place] [facility] to consider transanal resection of small distal rectal lesion (GIST vs. Carcinoid)  Thanks

## 2014-12-06 NOTE — Telephone Encounter (Signed)
Patient is scheduled to see Dr. Marcello Moores at [facility] for 12/31/14 8:30.   Left message for patient to call back

## 2014-12-07 ENCOUNTER — Encounter (HOSPITAL_COMMUNITY): Payer: Self-pay | Admitting: Gastroenterology

## 2014-12-07 NOTE — Telephone Encounter (Signed)
Left message for patient to call back  

## 2014-12-10 NOTE — Telephone Encounter (Signed)
Left message for patient to call back  

## 2014-12-10 NOTE — Telephone Encounter (Signed)
Patient returned call and was on my voicemail.  I attempted to return the call and I was sent to her voicemail.  I left her detailed message about the appt details and that I mailed her appt information and [facility]'s map and contact information.  She is asked to call me back if she has any additional questions or concerns.

## 2014-12-31 ENCOUNTER — Other Ambulatory Visit: Payer: Self-pay | Admitting: General Surgery

## 2014-12-31 NOTE — H&P (Signed)
Amy Burnett 12/31/2014 8:37 AM Location: [facility] Patient #: [id number] DOB: 06-Nov-1950 Married / Language: Cleophus Molt / Race: White Female History of Present Illness [name] MD; 7/49/4496 9:08 AM) Patient words: rectal lesion.  The patient is a 64 year old female who presents with anal lesions. This is a 64 year old female who presents to the office with a distal rectal lesion that has been followed on colonoscopy for the past 8-10 years per patient. This has been biopsied once with tunneling technique and was thought to be a hyperplastic polyp. Over the past few years she has been getting serial rectal ultrasounds to evaluate this as it appears to have a submucosal component. On ultrasound the lesion was noted to be hypoechoic and measures approximately 1.2 x 1.3 cm. It appears to communicate with the Mc vascularity propria. The size is slightly larger than the last ultrasound approximately 18 months ago. The patient is here to discuss surgical resection. She denies any symptoms or rectal bleeding. She has no family history of colon cancer. Other Problems Elbert Ewings, CMA; 12/31/2014 8:38 AM) Anxiety Disorder Depression Heart murmur Kidney Stone Sleep Apnea  Past Surgical History Elbert Ewings, CMA; 12/31/2014 8:38 AM) Tonsillectomy  Diagnostic Studies History Elbert Ewings, CMA; 12/31/2014 8:38 AM) Colonoscopy 5-10 years ago  Allergies Elbert Ewings, CMA; 12/31/2014 8:38 AM) No Known Drug Allergies 12/31/2014  Medication History Elbert Ewings, CMA; 12/31/2014 8:39 AM) ROPINIRole HCl (1MG  Tablet, Oral) Active. Sertraline HCl (100MG  Tablet, Oral) Active. Neupro (2MG /24HR Patch 24HR, Transdermal) Active. Ibuprofen (200MG  Tablet, Oral) Active. Xanax (0.5MG  Tablet, Oral) Active. Medications Reconciled  Social History Elbert Ewings, [place]; 12/31/2014 8:38 AM) Alcohol use Moderate alcohol use. Caffeine use Carbonated beverages. No drug use Tobacco use  Former smoker.  Family History Elbert Ewings, [place]; 12/31/2014 8:38 AM) Alcohol Abuse Father. Breast Cancer Mother. Heart Disease Father, Son. Malignant Neoplasm Of Pancreas Mother.     Review of Systems Elbert Ewings CMA; 12/31/2014 8:38 AM) General Not Present- Appetite Loss, Chills, Fatigue, Fever, Night Sweats, Weight Gain and Weight Loss. Skin Not Present- Change in Wart/Mole, Dryness, Hives, Jaundice, New Lesions, Non-Healing Wounds, Rash and Ulcer. HEENT Not Present- Earache, Hearing Loss, Hoarseness, Nose Bleed, Oral Ulcers, Ringing in the Ears, Seasonal Allergies, Sinus Pain, Sore Throat, Visual Disturbances, Wears glasses/contact lenses and Yellow Eyes. Respiratory Not Present- Bloody sputum, Chronic Cough, Difficulty Breathing, Snoring and Wheezing. Breast Not Present- Breast Mass, Breast Pain, Nipple Discharge and Skin Changes. Cardiovascular Not Present- Chest Pain, Difficulty Breathing Lying Down, Leg Cramps, Palpitations, Rapid Heart Rate, Shortness of Breath and Swelling of Extremities. Gastrointestinal Not Present- Abdominal Pain, Bloating, Bloody Stool, Change in Bowel Habits, Chronic diarrhea, Constipation, Difficulty Swallowing, Excessive gas, Gets full quickly at meals, Hemorrhoids, Indigestion, Nausea, Rectal Pain and Vomiting. Musculoskeletal Not Present- Back Pain, Joint Pain, Joint Stiffness, Muscle Pain, Muscle Weakness and Swelling of Extremities. Neurological Not Present- Decreased Memory, Fainting, Headaches, Numbness, Seizures, Tingling, Tremor, Trouble walking and Weakness. Psychiatric Not Present- Anxiety, Bipolar, Change in Sleep Pattern, Depression, Fearful and Frequent crying. Endocrine Not Present- Cold Intolerance, Excessive Hunger, Hair Changes, Heat Intolerance, Hot flashes and New Diabetes. Hematology Not Present- Easy Bruising, Excessive bleeding, Gland problems, HIV and Persistent Infections.  Vitals Elbert Ewings CMA; 12/31/2014 8:40 AM) 12/31/2014  8:40 AM Weight: 238 lb Height: 67in Body Surface Area: 2.26 m Body Mass Index: 37.28 kg/m Temp.: 97.45F(Oral)  Pulse: 91 (Regular)  BP: 132/70 (Sitting, Left Arm, Standard)     Physical Exam [name] MD; 7/59/1638 9:13 AM)  General Mental Status-Alert. General Appearance-Consistent with stated age. Hydration-Well hydrated. Voice-Normal.  Head and Neck Head-normocephalic, atraumatic with no lesions or palpable masses. Trachea-midline. Thyroid Gland Characteristics - normal size and consistency.  Eye Eyeball - Bilateral-Extraocular movements intact. Sclera/Conjunctiva - Bilateral-No scleral icterus.  Chest and Lung Exam Chest and lung exam reveals -quiet, even and easy respiratory effort with no use of accessory muscles and on auscultation, normal breath sounds, no adventitious sounds and normal vocal resonance. Inspection Chest Wall - Normal. Back - normal.  Breast Breast - Left-Symmetric, Non Tender, No Biopsy scars, no Dimpling, No Inflammation, No Lumpectomy scars, No Mastectomy scars, No Peau d' Orange. Breast - Right-Symmetric, Non Tender, No Biopsy scars, no Dimpling, No Inflammation, No Lumpectomy scars, No Mastectomy scars, No Peau d' Orange. Breast Lump-No Palpable Breast Mass.  Cardiovascular Cardiovascular examination reveals -normal heart sounds, regular rate and rhythm with no murmurs and normal pedal pulses bilaterally.  Abdomen Inspection Inspection of the abdomen reveals - No Hernias. Skin - Scar - no surgical scars. Palpation/Percussion Palpation and Percussion of the abdomen reveal - Soft, Non Tender, No Rebound tenderness, No Rigidity (guarding) and No hepatosplenomegaly. Auscultation Auscultation of the abdomen reveals - Bowel sounds normal.  Rectal Anorectal Exam External - normal external exam. Internal - Note: Mobile, submucosal distal rectal mass approximately 4-5 cm from the anal verge. Located in  left anterior region.  Neurologic Neurologic evaluation reveals -alert and oriented x 3 with no impairment of recent or remote memory. Mental Status-Normal.  Musculoskeletal Normal Exam - Left-Upper Extremity Strength Normal and Lower Extremity Strength Normal. Normal Exam - Right-Upper Extremity Strength Normal and Lower Extremity Strength Normal.    Assessment & Plan [name] MD; 3/34/3568 9:10 AM)  RECTAL MASS (787.99  K62.9) Impression: 64 year old female who presents to the office with any distal rectal mass that is enlarging in size. On exam this appears to be in the left anterior region approximately 4-5 cm from anal verge. I have recommended transanal excision. I think this can be done with minimal problems and good margins. We discussed this in detail, and all questions were answered. Risk of procedure include bleeding, pain and a small risk of perforation of the distal rectum.

## 2015-04-05 ENCOUNTER — Encounter (HOSPITAL_BASED_OUTPATIENT_CLINIC_OR_DEPARTMENT_OTHER): Payer: Self-pay | Admitting: *Deleted

## 2015-04-05 NOTE — Progress Notes (Signed)
NPO AFTER MN.  ARRIVE AT 0700.  NEEDS HG.  WILL TAKE ZOLOFT AM DOS W/ SIPS OF WATER AND IF NEEDED TAKE XANAX.

## 2015-04-10 ENCOUNTER — Other Ambulatory Visit: Payer: Self-pay | Admitting: General Surgery

## 2015-04-11 ENCOUNTER — Ambulatory Visit (HOSPITAL_BASED_OUTPATIENT_CLINIC_OR_DEPARTMENT_OTHER): Payer: BLUE CROSS/BLUE SHIELD | Admitting: Anesthesiology

## 2015-04-11 ENCOUNTER — Encounter (HOSPITAL_BASED_OUTPATIENT_CLINIC_OR_DEPARTMENT_OTHER)
Admission: RE | Disposition: A | Discharge status: Home / Self Care | Payer: Self-pay | Source: Ambulatory Visit | Attending: General Surgery

## 2015-04-11 ENCOUNTER — Encounter (HOSPITAL_BASED_OUTPATIENT_CLINIC_OR_DEPARTMENT_OTHER): Payer: Self-pay | Admitting: Anesthesiology

## 2015-04-11 ENCOUNTER — Ambulatory Visit (HOSPITAL_BASED_OUTPATIENT_CLINIC_OR_DEPARTMENT_OTHER)
Admission: RE | Admit: 2015-04-11 | Discharge: 2015-04-11 | Disposition: A | Discharge status: Home / Self Care | Payer: BLUE CROSS/BLUE SHIELD | Source: Ambulatory Visit | Attending: General Surgery | Admitting: General Surgery

## 2015-04-11 DIAGNOSIS — Z87891 Personal history of nicotine dependence: Secondary | ICD-10-CM | POA: Diagnosis not present

## 2015-04-11 DIAGNOSIS — F329 Major depressive disorder, single episode, unspecified: Secondary | ICD-10-CM | POA: Diagnosis not present

## 2015-04-11 DIAGNOSIS — G4730 Sleep apnea, unspecified: Secondary | ICD-10-CM | POA: Insufficient documentation

## 2015-04-11 DIAGNOSIS — Z6836 Body mass index (BMI) 36.0-36.9, adult: Secondary | ICD-10-CM | POA: Insufficient documentation

## 2015-04-11 DIAGNOSIS — D1779 Benign lipomatous neoplasm of other sites: Secondary | ICD-10-CM | POA: Diagnosis not present

## 2015-04-11 DIAGNOSIS — K629 Disease of anus and rectum, unspecified: Secondary | ICD-10-CM | POA: Diagnosis present

## 2015-04-11 DIAGNOSIS — Z79899 Other long term (current) drug therapy: Secondary | ICD-10-CM | POA: Diagnosis not present

## 2015-04-11 DIAGNOSIS — F419 Anxiety disorder, unspecified: Secondary | ICD-10-CM | POA: Insufficient documentation

## 2015-04-11 HISTORY — DX: Cardiac murmur, unspecified: R01.1

## 2015-04-11 HISTORY — DX: Obstructive sleep apnea (adult) (pediatric): Z99.89

## 2015-04-11 HISTORY — DX: Personal history of other mental and behavioral disorders: Z86.59

## 2015-04-11 HISTORY — DX: Obstructive sleep apnea (adult) (pediatric): G47.33

## 2015-04-11 HISTORY — DX: Restless legs syndrome: G25.81

## 2015-04-11 HISTORY — PX: TRANSANAL EXCISION OF RECTAL MASS: SHX6134

## 2015-04-11 LAB — POCT HEMOGLOBIN-HEMACUE: Hemoglobin: 13.6 g/dL (ref 12.0–15.0)

## 2015-04-11 SURGERY — EXCISION, MASS, RECTUM, ANAL APPROACH
Anesthesia: Monitor Anesthesia Care | Site: Rectum

## 2015-04-11 MED ORDER — SODIUM CHLORIDE 0.9 % IV SOLN
250.0000 mL | INTRAVENOUS | Status: DC | PRN
  Filled 2015-04-11: qty 250

## 2015-04-11 MED ORDER — SODIUM CHLORIDE 0.9 % IJ SOLN
3.0000 mL | INTRAMUSCULAR | Status: DC | PRN
  Filled 2015-04-11: qty 3

## 2015-04-11 MED ORDER — PROPOFOL 10 MG/ML IV BOLUS
INTRAVENOUS | Status: DC | PRN
  Administered 2015-04-11 (×2): 50 mg via INTRAVENOUS

## 2015-04-11 MED ORDER — MIDAZOLAM HCL 2 MG/2ML IJ SOLN
INTRAMUSCULAR | Status: AC
  Filled 2015-04-11: qty 4

## 2015-04-11 MED ORDER — FENTANYL CITRATE (PF) 100 MCG/2ML IJ SOLN
25.0000 ug | INTRAMUSCULAR | Status: DC | PRN
  Filled 2015-04-11: qty 1

## 2015-04-11 MED ORDER — MEPERIDINE HCL 25 MG/ML IJ SOLN
6.2500 mg | INTRAMUSCULAR | Status: DC | PRN
  Filled 2015-04-11: qty 1

## 2015-04-11 MED ORDER — MIDAZOLAM HCL 2 MG/2ML IJ SOLN
INTRAMUSCULAR | Status: AC
  Filled 2015-04-11: qty 2

## 2015-04-11 MED ORDER — ACETAMINOPHEN 325 MG PO TABS
650.0000 mg | ORAL_TABLET | ORAL | Status: DC | PRN
  Filled 2015-04-11: qty 2

## 2015-04-11 MED ORDER — KETAMINE HCL 50 MG/ML IJ SOLN
INTRAMUSCULAR | Status: AC
  Filled 2015-04-11: qty 10

## 2015-04-11 MED ORDER — LACTATED RINGERS IV SOLN
INTRAVENOUS | Status: DC
Start: 2015-04-11 — End: 2015-04-11
  Administered 2015-04-11: 08:00:00 via INTRAVENOUS
  Filled 2015-04-11: qty 1000

## 2015-04-11 MED ORDER — HYDROCODONE-ACETAMINOPHEN 5-325 MG PO TABS: 1.0000 | ORAL_TABLET | Freq: Four times a day (QID) | ORAL | Status: DC | PRN

## 2015-04-11 MED ORDER — PROPOFOL 500 MG/50ML IV EMUL
INTRAVENOUS | Status: DC | PRN
  Administered 2015-04-11: 50 ug/kg/min via INTRAVENOUS

## 2015-04-11 MED ORDER — SODIUM CHLORIDE 0.9 % IV SOLN
1.0000 g | INTRAVENOUS | Status: AC
  Administered 2015-04-11 (×2): 1 g via INTRAVENOUS
  Filled 2015-04-11: qty 1

## 2015-04-11 MED ORDER — SODIUM CHLORIDE 0.9 % IJ SOLN
3.0000 mL | Freq: Two times a day (BID) | INTRAMUSCULAR | Status: DC
  Filled 2015-04-11: qty 3

## 2015-04-11 MED ORDER — PROMETHAZINE HCL 25 MG/ML IJ SOLN
6.2500 mg | INTRAMUSCULAR | Status: DC | PRN
  Filled 2015-04-11: qty 1

## 2015-04-11 MED ORDER — MIDAZOLAM HCL 5 MG/5ML IJ SOLN
INTRAMUSCULAR | Status: DC | PRN
  Administered 2015-04-11 (×2): 2 mg via INTRAVENOUS

## 2015-04-11 MED ORDER — LACTATED RINGERS IV SOLN
INTRAVENOUS | Status: DC
  Filled 2015-04-11: qty 1000

## 2015-04-11 MED ORDER — FENTANYL CITRATE (PF) 100 MCG/2ML IJ SOLN
INTRAMUSCULAR | Status: AC
  Filled 2015-04-11: qty 4

## 2015-04-11 MED ORDER — OXYCODONE HCL 5 MG PO TABS
5.0000 mg | ORAL_TABLET | ORAL | Status: DC | PRN
  Filled 2015-04-11: qty 2

## 2015-04-11 MED ORDER — FENTANYL CITRATE (PF) 100 MCG/2ML IJ SOLN
INTRAMUSCULAR | Status: DC | PRN
  Administered 2015-04-11: 50 ug via INTRAVENOUS

## 2015-04-11 MED ORDER — BUPIVACAINE-EPINEPHRINE 0.5% -1:200000 IJ SOLN
INTRAMUSCULAR | Status: DC | PRN
  Administered 2015-04-11: 30 mL

## 2015-04-11 MED ORDER — ACETAMINOPHEN 650 MG RE SUPP
650.0000 mg | RECTAL | Status: DC | PRN
  Filled 2015-04-11: qty 1

## 2015-04-11 MED ORDER — 0.9 % SODIUM CHLORIDE (POUR BTL) OPTIME
TOPICAL | Status: DC | PRN
  Administered 2015-04-11: 500 mL

## 2015-04-11 MED ORDER — FENTANYL CITRATE (PF) 100 MCG/2ML IJ SOLN
INTRAMUSCULAR | Status: AC
  Filled 2015-04-11: qty 2

## 2015-04-11 MED ORDER — LIDOCAINE 5 % EX OINT
TOPICAL_OINTMENT | CUTANEOUS | Status: DC | PRN
  Administered 2015-04-11: 1 via TOPICAL

## 2015-04-11 SURGICAL SUPPLY — 51 items
BENZOIN TINCTURE PRP APPL 2/3 (GAUZE/BANDAGES/DRESSINGS) ×2 IMPLANT
BLADE HEX COATED 2.75 (ELECTRODE) ×2 IMPLANT
BLADE SURG 10 STRL SS (BLADE) ×2 IMPLANT
BRIEF STRETCH FOR OB PAD LRG (UNDERPADS AND DIAPERS) ×4 IMPLANT
CANISTER SUCTION 2500CC (MISCELLANEOUS) ×2 IMPLANT
CLOTH BEACON ORANGE TIMEOUT ST (SAFETY) IMPLANT
COVER BACK TABLE 60X90IN (DRAPES) ×2 IMPLANT
COVER MAYO STAND STRL (DRAPES) ×2 IMPLANT
DECANTER SPIKE VIAL GLASS SM (MISCELLANEOUS) IMPLANT
DRAPE LG THREE QUARTER DISP (DRAPES) IMPLANT
DRAPE PED LAPAROTOMY (DRAPES) ×2 IMPLANT
DRAPE UNDERBUTTOCKS STRL (DRAPE) IMPLANT
DRAPE UTILITY XL STRL (DRAPES) ×2 IMPLANT
DRSG PAD ABDOMINAL 8X10 ST (GAUZE/BANDAGES/DRESSINGS) ×2 IMPLANT
ELECT BLADE 6.5 .24CM SHAFT (ELECTRODE) ×2 IMPLANT
ELECT REM PT RETURN 9FT ADLT (ELECTROSURGICAL) ×2
ELECTRODE REM PT RTRN 9FT ADLT (ELECTROSURGICAL) ×1 IMPLANT
GAUZE SPONGE 4X4 16PLY XRAY LF (GAUZE/BANDAGES/DRESSINGS) IMPLANT
GAUZE VASELINE 3X9 (GAUZE/BANDAGES/DRESSINGS) IMPLANT
GLOVE BIO SURGEON STRL SZ 6.5 (GLOVE) ×4 IMPLANT
GOWN STRL REUS W/ TWL LRG LVL3 (GOWN DISPOSABLE) ×1 IMPLANT
GOWN STRL REUS W/ TWL XL LVL3 (GOWN DISPOSABLE) ×2 IMPLANT
GOWN STRL REUS W/TWL LRG LVL3 (GOWN DISPOSABLE) ×1
GOWN STRL REUS W/TWL XL LVL3 (GOWN DISPOSABLE) ×2
KIT ROOM TURNOVER WOR (KITS) ×2 IMPLANT
LEGGING LITHOTOMY PAIR STRL (DRAPES) IMPLANT
MANIFOLD NEPTUNE II (INSTRUMENTS) ×2 IMPLANT
NDL SAFETY ECLIPSE 18X1.5 (NEEDLE) IMPLANT
NEEDLE HYPO 18GX1.5 SHARP (NEEDLE)
NEEDLE HYPO 25X1 1.5 SAFETY (NEEDLE) ×2 IMPLANT
NS IRRIG 500ML POUR BTL (IV SOLUTION) ×2 IMPLANT
PACK BASIN DAY SURGERY FS (CUSTOM PROCEDURE TRAY) ×2 IMPLANT
PAD ABD 8X10 STRL (GAUZE/BANDAGES/DRESSINGS) IMPLANT
PAD ARMBOARD 7.5X6 YLW CONV (MISCELLANEOUS) ×2 IMPLANT
PENCIL BUTTON HOLSTER BLD 10FT (ELECTRODE) ×2 IMPLANT
SPONGE GAUZE 4X4 12PLY (GAUZE/BANDAGES/DRESSINGS) ×2 IMPLANT
SPONGE GAUZE 4X4 12PLY STER LF (GAUZE/BANDAGES/DRESSINGS) ×2 IMPLANT
SPONGE SURGIFOAM ABS GEL 12-7 (HEMOSTASIS) IMPLANT
SUT CHROMIC 2 0 SH (SUTURE) ×4 IMPLANT
SUT CHROMIC 3 0 SH 27 (SUTURE) ×2 IMPLANT
SUT MON AB 3-0 SH 27 (SUTURE) ×1
SUT MON AB 3-0 SH27 (SUTURE) ×1 IMPLANT
SUT VIC AB 3-0 SH 27 (SUTURE) ×1
SUT VIC AB 3-0 SH 27X BRD (SUTURE) ×1 IMPLANT
SUT VIC AB 4-0 P-3 18XBRD (SUTURE) IMPLANT
SUT VIC AB 4-0 P3 18 (SUTURE)
SYR CONTROL 10ML LL (SYRINGE) ×2 IMPLANT
TOWEL NATURAL 6PK STERILE (DISPOSABLE) ×4 IMPLANT
TRAY DSU PREP LF (CUSTOM PROCEDURE TRAY) ×2 IMPLANT
TUBE CONNECTING 12X1/4 (SUCTIONS) ×2 IMPLANT
YANKAUER SUCT BULB TIP NO VENT (SUCTIONS) ×2 IMPLANT

## 2015-04-11 NOTE — Anesthesia Procedure Notes (Signed)
Procedure Name: Cliff Performed by: Bethena Roys T Oxygen Delivery Method: Simple face mask Placement Confirmation: positive ETCO2 and CO2 detector

## 2015-04-11 NOTE — Anesthesia Preprocedure Evaluation (Addendum)
Anesthesia Evaluation  Patient identified by MRN, date of birth, ID band Patient awake    Reviewed: Allergy & Precautions, NPO status , Patient's Chart, lab work & pertinent test results  Airway Mallampati: II  TM Distance: >3 FB Neck ROM: Full    Dental no notable dental hx.    Pulmonary sleep apnea and Continuous Positive Airway Pressure Ventilation , former smoker,    Pulmonary exam normal breath sounds clear to auscultation       Cardiovascular negative cardio ROS Normal cardiovascular exam Rhythm:Regular Rate:Normal     Neuro/Psych Anxiety negative neurological ROS     GI/Hepatic negative GI ROS, Neg liver ROS,   Endo/Other  Morbid obesity  Renal/GU negative Renal ROS  negative genitourinary   Musculoskeletal negative musculoskeletal ROS (+)   Abdominal (+) + obese,   Peds negative pediatric ROS (+)  Hematology negative hematology ROS (+)   Anesthesia Other Findings   Reproductive/Obstetrics negative OB ROS                             Anesthesia Physical  Anesthesia Plan  ASA: III  Anesthesia Plan: MAC   Post-op Pain Management:    Induction: Intravenous  Airway Management Planned: Natural Airway  Additional Equipment:   Intra-op Plan:   Post-operative Plan:   Informed Consent: I have reviewed the patients History and Physical, chart, labs and discussed the procedure including the risks, benefits and alternatives for the proposed anesthesia with the patient or authorized representative who has indicated his/her understanding and acceptance.   Dental advisory given  Plan Discussed with: CRNA  Anesthesia Plan Comments:         Anesthesia Quick Evaluation

## 2015-04-11 NOTE — H&P (Signed)
The patient is a 64 year old female who presents with anal lesions. This is a 64 year old female who presents to the office with a distal rectal lesion that has been followed on colonoscopy for the past 8-10 years per patient. This has been biopsied once with tunneling technique and was thought to be a hyperplastic polyp. Over the past few years she has been getting serial rectal ultrasounds to evaluate this as it appears to have a submucosal component. On ultrasound the lesion was noted to be hypoechoic and measures approximately 1.2 x 1.3 cm. It appears to communicate with the Mc vascularity propria. The size is slightly larger than the last ultrasound approximately 18 months ago. The patient is here to discuss surgical resection. She denies any symptoms or rectal bleeding. She has no family history of colon cancer. Other Problems Elbert Ewings, CMA; 12/31/2014 8:38 AM) Anxiety Disorder Depression Heart murmur Kidney Stone Sleep Apnea  Past Surgical History Elbert Ewings, CMA; 12/31/2014 8:38 AM) Tonsillectomy  Diagnostic Studies History Elbert Ewings, CMA; 12/31/2014 8:38 AM) Colonoscopy 5-10 years ago  Allergies Elbert Ewings, CMA; 12/31/2014 8:38 AM) No Known Drug Allergies 12/31/2014  Medication History Elbert Ewings, CMA; 12/31/2014 8:39 AM) ROPINIRole HCl (1MG  Tablet, Oral) Active. Sertraline HCl (100MG  Tablet, Oral) Active. Neupro (2MG /24HR Patch 24HR, Transdermal) Active. Ibuprofen (200MG  Tablet, Oral) Active. Xanax (0.5MG  Tablet, Oral) Active. Medications Reconciled  Social History Elbert Ewings, [place]; 12/31/2014 8:38 AM) Alcohol use Moderate alcohol use. Caffeine use Carbonated beverages. No drug use Tobacco use Former smoker.  Family History Elbert Ewings, [place]; 12/31/2014 8:38 AM) Alcohol Abuse Father. Breast Cancer Mother. Heart Disease Father, Son. Malignant Neoplasm Of Pancreas Mother.     Review of Systems Elbert Ewings CMA; 12/31/2014 8:38  AM) General Not Present- Appetite Loss, Chills, Fatigue, Fever, Night Sweats, Weight Gain and Weight Loss. Skin Not Present- Change in Wart/Mole, Dryness, Hives, Jaundice, New Lesions, Non-Healing Wounds, Rash and Ulcer. HEENT Not Present- Earache, Hearing Loss, Hoarseness, Nose Bleed, Oral Ulcers, Ringing in the Ears, Seasonal Allergies, Sinus Pain, Sore Throat, Visual Disturbances, Wears glasses/contact lenses and Yellow Eyes. Respiratory Not Present- Bloody sputum, Chronic Cough, Difficulty Breathing, Snoring and Wheezing. Breast Not Present- Breast Mass, Breast Pain, Nipple Discharge and Skin Changes. Cardiovascular Not Present- Chest Pain, Difficulty Breathing Lying Down, Leg Cramps, Palpitations, Rapid Heart Rate, Shortness of Breath and Swelling of Extremities. Gastrointestinal Not Present- Abdominal Pain, Bloating, Bloody Stool, Change in Bowel Habits, Chronic diarrhea, Constipation, Difficulty Swallowing, Excessive gas, Gets full quickly at meals, Hemorrhoids, Indigestion, Nausea, Rectal Pain and Vomiting. Musculoskeletal Not Present- Back Pain, Joint Pain, Joint Stiffness, Muscle Pain, Muscle Weakness and Swelling of Extremities. Neurological Not Present- Decreased Memory, Fainting, Headaches, Numbness, Seizures, Tingling, Tremor, Trouble walking and Weakness. Psychiatric Not Present- Anxiety, Bipolar, Change in Sleep Pattern, Depression, Fearful and Frequent crying. Endocrine Not Present- Cold Intolerance, Excessive Hunger, Hair Changes, Heat Intolerance, Hot flashes and New Diabetes. Hematology Not Present- Easy Bruising, Excessive bleeding, Gland problems, HIV and Persistent Infections.  BP 143/65 mmHg  Pulse 81  Temp(Src) 98.5 F (36.9 C) (Oral)  Resp 16  Ht 5\' 7"  (1.702 m)  Wt 106.369 kg (234 lb 8 oz)  BMI 36.72 kg/m2  SpO2 97%  LMP 01/04/2012  Physical Exam  General Mental Status-Alert. General Appearance-Consistent with stated age. Hydration-Well  hydrated. Voice-Normal.  Head and Neck Head-normocephalic, atraumatic with no lesions or palpable masses. Trachea-midline. Thyroid Gland Characteristics - normal size and consistency.  Eye Eyeball - Bilateral-Extraocular movements intact. Sclera/Conjunctiva - Bilateral-No scleral icterus.  Chest and Lung Exam Chest and lung exam reveals -quiet, even and easy respiratory effort with no use of accessory muscles and on auscultation, normal breath sounds, no adventitious sounds and normal vocal resonance. Inspection Chest Wall - Normal. Back - normal.  Breast Breast - Left-Symmetric, Non Tender, No Biopsy scars, no Dimpling, No Inflammation, No Lumpectomy scars, No Mastectomy scars, No Peau d' Orange. Breast - Right-Symmetric, Non Tender, No Biopsy scars, no Dimpling, No Inflammation, No Lumpectomy scars, No Mastectomy scars, No Peau d' Orange. Breast Lump-No Palpable Breast Mass.  Cardiovascular Cardiovascular examination reveals -normal heart sounds, regular rate and rhythm with no murmurs and normal pedal pulses bilaterally.  Abdomen Inspection Inspection of the abdomen reveals - No Hernias. Skin - Scar - no surgical scars. Palpation/Percussion Palpation and Percussion of the abdomen reveal - Soft, Non Tender, No Rebound tenderness, No Rigidity (guarding) and No hepatosplenomegaly. Auscultation Auscultation of the abdomen reveals - Bowel sounds normal.  Rectal Anorectal Exam External - normal external exam. Internal - Note: Mobile, submucosal distal rectal mass approximately 4-5 cm from the anal verge. Located in left anterior region.  Neurologic Neurologic evaluation reveals -alert and oriented x 3 with no impairment of recent or remote memory. Mental Status-Normal.  Musculoskeletal Normal Exam - Left-Upper Extremity Strength Normal and Lower Extremity Strength Normal. Normal Exam - Right-Upper Extremity Strength Normal and Lower Extremity  Strength Normal.    Assessment & Plan   RECTAL MASS (787.99  K62.9) Impression: 64 year old female who presents to the office with any distal rectal mass that is enlarging in size. On exam this appears to be in the left anterior region approximately 4-5 cm from anal verge. I have recommended transanal excision. I think this can be done with minimal problems and good margins. We discussed this in detail, and all questions were answered. Risk of procedure include bleeding, pain and a small risk of perforation of the distal rectum.

## 2015-04-11 NOTE — Anesthesia Postprocedure Evaluation (Signed)
  Anesthesia Post-op Note  Patient: Amy Burnett  Procedure(s) Performed: Procedure(s) (LRB): TRANSANAL EXCISION OF DISTAL RECTAL MASS (N/A)  Patient Location: PACU  Anesthesia Type: MAC  Level of Consciousness: awake and alert   Airway and Oxygen Therapy: Patient Spontanous Breathing  Post-op Pain: mild  Post-op Assessment: Post-op Vital signs reviewed, Patient's Cardiovascular Status Stable, Respiratory Function Stable, Patent Airway and No signs of Nausea or vomiting  Last Vitals:  Filed Vitals:   04/11/15 1102  BP: 132/75  Pulse: 93  Temp: 36.7 C  Resp: 14    Post-op Vital Signs: stable   Complications: No apparent anesthesia complications

## 2015-04-11 NOTE — Op Note (Signed)
04/11/2015  9:37 AM  PATIENT:  Amy Burnett  64 y.o. female  Patient Care Team: Chesley Noon, MD as PCP - General (Family Medicine)  PRE-OPERATIVE DIAGNOSIS:  distal rectal mass  POST-OPERATIVE DIAGNOSIS:  Rectal wall lipoma  PROCEDURE:  TRANSANAL EXCISION OF DISTAL RECTAL MASS   Surgeon(s): [name], MD  ASSISTANT: none   ANESTHESIA:   local and MAC  SPECIMEN:  Source of Specimen:  distal rectal wall, left anterior  DISPOSITION OF SPECIMEN:  PATHOLOGY  COUNTS:  YES  PLAN OF CARE: Discharge to home after PACU  PATIENT DISPOSITION:  PACU - hemodynamically stable.  INDICATION: 64 y.o. F with a distal rectal wall mass being followed by GI.  It was enlarging in size by rectal [place], and it was decided to perform an excisional biopsy.     OR FINDINGS: distal rectal wall mass, consistent with lipoma on gross exam  DESCRIPTION: the patient was identified in the preoperative holding area and taken to the OR where they were laid on the operating room table.  MAC anesthesia was induced with some difficulty due to her size, position and OSA. The patient was then positioned in prone jackknife position with buttocks gently taped apart.  The patient was then prepped and draped in usual sterile fashion.  SCDs were noted to be in place prior to the initiation of anesthesia. A surgical timeout was performed indicating the correct patient, procedure, positioning and need for preoperative antibiotics.  A rectal block was performed using Marcaine with epinephrine.     I began with a digital rectal exam.  The mass could be palpated in the distal rectal wall.  I then placed a Hill-Ferguson anoscope into the anal canal and evaluated this completely.  The remaining anal canal appeared normal.  The overlying mucosa was normal as well.  The mass and overlying mucosa were removed from the rectal wall using Bovie electrocautery. The mass easily separated from the underlying muscularis layer. This  was then sent to pathology for further examination.  Gross examination showed the mass to most likely be a lipoma. Hemostasis was controlled using Bovie electrocautery. The edges of the rectal wall were reapproximated using a 3-0 Vicryl suture. The anal canal was irrigated. Hemostasis was excellent at the end of the case.

## 2015-04-11 NOTE — Transfer of Care (Signed)
  Last Vitals:  Filed Vitals:   04/11/15 0701  BP: 143/65  Pulse: 81  Temp: 36.9 C  Resp: 16   Immediate Anesthesia Transfer of Care Note  Patient: Amy Burnett  Procedure(s) Performed: Procedure(s) (LRB): TRANSANAL EXCISION OF DISTAL RECTAL MASS (N/A)  Patient Location: PACU  Anesthesia Type:MAC  Level of Consciousness: awake, alert  and oriented  Airway & Oxygen Therapy: Patient Spontanous Breathing and Patient connected to face mask oxygen  Post-op Assessment: Report given to PACU RN and Post -op Vital signs reviewed and stable  Post vital signs: Reviewed and stable  Complications: No apparent anesthesia complications

## 2015-04-11 NOTE — Discharge Instructions (Addendum)
Beginning the day after surgery:  You may sit in a tub of warm water, use a sitz bath or get in the shower and let warm water run over your anal area 2-3 times a day to relieve discomfort.  Eat a regular diet high in fiber.  Avoid foods that give you constipation or diarrhea.  Avoid foods that are difficult to digest, such as seeds, nuts, corn or popcorn.  Do not go any longer than 2 days without a bowel movement.  You may take a dose of Milk of Magnesia if you become constipated.    Drink 6-8 glasses of water daily.  Walking is encouraged.  Avoid strenuous activity and heavy lifting for one month after surgery.    Call the office if you have any questions or concerns.  Call immediately if you develop:   Excessive rectal bleeding (more than a cup or passing large clots)  Increased discomfort  Fever greater than 100 F  Difficulty urinating   Post Anesthesia Home Care Instructions  Activity: Get plenty of rest for the remainder of the day. A responsible adult should stay with you for 24 hours following the procedure.  For the next 24 hours, DO NOT: -Drive a car -Paediatric nurse -Drink alcoholic beverages -Take any medication unless instructed by your physician -Make any legal decisions or sign important papers.  Meals: Start with liquid foods such as gelatin or soup. Progress to regular foods as tolerated. Avoid greasy, spicy, heavy foods. If nausea and/or vomiting occur, drink only clear liquids until the nausea and/or vomiting subsides. Call your physician if vomiting continues.  Special Instructions/Symptoms: Your throat may feel dry or sore from the anesthesia or the breathing tube placed in your throat during surgery. If this causes discomfort, gargle with warm salt water. The discomfort should disappear within 24 hours.  If you had a scopolamine patch placed behind your ear for the management of post- operative nausea and/or vomiting:  1. The medication in the patch  is effective for 72 hours, after which it should be removed.  Wrap patch in a tissue and discard in the trash. Wash hands thoroughly with soap and water. 2. You may remove the patch earlier than 72 hours if you experience unpleasant side effects which may include dry mouth, dizziness or visual disturbances. 3. Avoid touching the patch. Wash your hands with soap and water after contact with the patch.

## 2015-04-12 ENCOUNTER — Encounter (HOSPITAL_BASED_OUTPATIENT_CLINIC_OR_DEPARTMENT_OTHER): Payer: Self-pay | Admitting: General Surgery

## 2015-04-24 ENCOUNTER — Ambulatory Visit (INDEPENDENT_AMBULATORY_CARE_PROVIDER_SITE_OTHER): Payer: BLUE CROSS/BLUE SHIELD | Admitting: Obstetrics and Gynecology

## 2015-04-24 ENCOUNTER — Encounter: Payer: Self-pay | Admitting: Obstetrics and Gynecology

## 2015-04-24 VITALS — BP 118/76 | HR 90 | Resp 16 | Ht 65.5 in | Wt 236.6 lb

## 2015-04-24 DIAGNOSIS — Z01419 Encounter for gynecological examination (general) (routine) without abnormal findings: Secondary | ICD-10-CM

## 2015-04-24 DIAGNOSIS — Z780 Asymptomatic menopausal state: Secondary | ICD-10-CM

## 2015-04-24 DIAGNOSIS — Z0000 Encounter for general adult medical examination without abnormal findings: Secondary | ICD-10-CM

## 2015-04-24 DIAGNOSIS — N889 Noninflammatory disorder of cervix uteri, unspecified: Secondary | ICD-10-CM

## 2015-04-24 LAB — HEMOGLOBIN, FINGERSTICK: Hemoglobin, fingerstick: 13.5 g/dL (ref 12.0–16.0)

## 2015-04-24 NOTE — Progress Notes (Signed)
Patient ID: Amy Burnett, female   DOB: 03/16/1951, 64 y.o.   MRN: [id number] 64 y.o. [id number] Married Caucasian female here for annual exam.    Status post excision of rectal nodule on 81/27/51 with Dr. [name].  Pathology showed lipoma.  Off HRT.  Mild hot flashes.   PCP:  Era Skeen, MD   Patient's last menstrual period was 01/04/2012.          Sexually active: No.female  The current method of family planning is post menopausal status.    Exercising: No.   Smoker:  Former  Health Maintenance: Pap:  04-11-14 Neg:Neg HR HPV History of abnormal Pap:  no MMG:  12-14-13 3D density Cat.C/Neg/BiRads1:[facility]. Colonoscopy:  02/2006 normal with [facility].  Next due 02/2016. BMD:   Years ago Result  Normal 15 years ago.  TDaP:  08/2008 Screening Labs:  Hb today: 13.5, Urine today: unable to void   reports that she quit smoking about 29 years ago. Her smoking use included Cigarettes. She quit after 15 years of use. She has never used smokeless tobacco. She reports that she drinks about 2.4 oz of alcohol per week. She reports that she does not use illicit drugs.  Past Medical History  Diagnosis Date  . History of kidney stones   . OSA on CPAP   . Anxiety   . History of panic attacks   . Rectal mass   . RLS (restless legs syndrome)   . Heart murmur     Past Surgical History  Procedure Laterality Date  . Tonsillectomy and adenoidectomy  age 55  . Colonoscopy  03/09/2006  . Eus N/A 06/01/2013    Procedure: LOWER ENDOSCOPIC ULTRASOUND (EUS);  Surgeon: Milus Banister, MD;  Location: [facility];  Service: Endoscopy;  Laterality: N/A;  . Eus N/A 12/06/2014    Procedure: LOWER ENDOSCOPIC ULTRASOUND (EUS);  Surgeon: Milus Banister, MD;  Location: [facility];  Service: Endoscopy;  Laterality: N/A;  . Left ureteroscopic laser lithotripsy stone extraction/ stent placement  12-13-2001  . D & c hysteroscopy/  resection myoma and endometrial polyp  09-23-2009  . D & c  hysteroscopy w/ resection myoma  01-28-2009  . Transthoracic echocardiogram  09-16-2009    mild LVH,  ef 60-65%,  grade I diastolic dysfunction/  trivial MR and TR  . Cardiovascular stress test  09-19-2009    normal perfusion study/  ef 80%  . Transanal excision of rectal mass N/A 04/11/2015    Procedure: TRANSANAL EXCISION OF DISTAL RECTAL MASS;  Surgeon: [name], MD;  Location: [facility];  Service: General;  Laterality: N/A;    Current Outpatient Prescriptions  Medication Sig Dispense Refill  . ALPRAZolam (XANAX) 0.5 MG tablet Take 0.5 mg by mouth daily as needed for anxiety.     Marland Kitchen ibuprofen (ADVIL,MOTRIN) 200 MG tablet Take 600 mg by mouth every 6 (six) hours as needed for headache or moderate pain.    Marland Kitchen rOPINIRole (REQUIP) 1 MG tablet Take 1 tablet (1 mg total) by mouth at bedtime. 90 tablet 3  . rotigotine (NEUPRO) 2 MG/24HR 2 patches  daily , alternate sites of the body. (Patient taking differently: Place 2 patches onto the skin daily. 2 patches  daily , alternate sites of the body.) 180 patch 3  . sertraline (ZOLOFT) 100 MG tablet Take 200 mg by mouth every morning.      No current facility-administered medications for this visit.    Family History  Problem Relation Age of Onset  . Breast cancer Mother 35  . Pancreatic cancer Mother 62    ROS:  Pertinent items are noted in HPI.  Otherwise, a comprehensive ROS was negative.  Exam:   BP 118/76 mmHg  Pulse 90  Resp 16  Ht 5' 5.5" (1.664 m)  Wt 236 lb 9.6 oz (107.321 kg)  BMI 38.76 kg/m2  LMP 01/04/2012    General appearance: alert, cooperative and appears stated age Head: Normocephalic, without obvious abnormality, atraumatic Neck: no adenopathy, supple, symmetrical, trachea midline and thyroid normal to inspection and palpation Lungs: clear to auscultation bilaterally Breasts: normal appearance, no masses or tenderness, Inspection negative, No nipple retraction or dimpling, No nipple discharge or  bleeding, No axillary or supraclavicular adenopathy Heart: regular rate and rhythm Abdomen: soft, non-tender; bowel sounds normal; no masses,  no organomegaly Extremities: extremities normal, atraumatic, no cyanosis or edema Skin: Skin color, texture, turgor normal. No rashes or lesions Lymph nodes: Cervical, supraclavicular, and axillary nodes normal. No abnormal inguinal nodes palpated Neurologic: Grossly normal  Pelvic: External genitalia:  no lesions              Urethra:  normal appearing urethra with no masses, tenderness or lesions              Bartholins and Skenes: normal                 Vagina: normal appearing vagina with normal color and discharge, no lesions              Cervix:  Small 4 mm red nonulcerated region at 3 o'clock              Pap taken: Yes.   Bimanual Exam:  Uterus:  normal size, contour, position, consistency, mobility, non-tender  - Bimanual limited by body habitus.              Adnexa: normal adnexa and no mass, fullness, tenderness              Rectovaginal: No..    Chaperone was present for exam.  Assessment:   Well woman visit with normal exam. Persistent of cervix. Normal pap last year.  Rectal nodule. Status post excision of lipoma. Fecal incontinence.   Plan: Yearly mammogram recommended after age 51.  Recommended self breast exam.  Pap and HR HPV as above. Discussed Calcium, Vitamin D, regular exercise program including cardiovascular and weight bearing exercise. Labs performed.  No..     Refills given on medications.  No..   Bone density ordered.  Will return for colposcopy of cervix with biopsy. Follow up annually and prn.      After visit summary provided.

## 2015-04-24 NOTE — Patient Instructions (Signed)

## 2015-04-26 ENCOUNTER — Telehealth: Payer: Self-pay | Admitting: Obstetrics and Gynecology

## 2015-04-26 LAB — IPS PAP TEST WITH HPV

## 2015-04-26 NOTE — Telephone Encounter (Signed)
Called patient to review benefits for procedure. Left voicemail to call back and review. °

## 2015-05-03 NOTE — Telephone Encounter (Signed)
Returning a call to Becky

## 2015-05-03 NOTE — Telephone Encounter (Signed)
Second call to patient to review benefits for colposcopy. Left voicemail to call back and review.  Please advise.

## 2015-05-03 NOTE — Telephone Encounter (Signed)
Message forwarded to [place].

## 2015-05-16 NOTE — Telephone Encounter (Signed)
Reviewed benefits with patient for colposcopy. Patient understood and agreeable. Patient ready to schedule appointment. Patient agreeable to appointment on 05/22/15 @10am  with Dr Quincy Simmonds. Patient aware of 72 hour cancellation policy with [id number] fee. Reviewed with [facility] prior to scheduling patient. Agreeable to inform patient of instructions to eat a good meal, hydrate well and able to take up to 800 mg ibuprofen 1 hour prior to procedure. Patient understood and aware she may call with any further questions. No further questions at time of call. Ok to close.

## 2015-05-22 ENCOUNTER — Ambulatory Visit (INDEPENDENT_AMBULATORY_CARE_PROVIDER_SITE_OTHER): Payer: BLUE CROSS/BLUE SHIELD | Admitting: Obstetrics and Gynecology

## 2015-05-22 ENCOUNTER — Encounter: Payer: Self-pay | Admitting: Obstetrics and Gynecology

## 2015-05-22 DIAGNOSIS — N889 Noninflammatory disorder of cervix uteri, unspecified: Secondary | ICD-10-CM | POA: Diagnosis not present

## 2015-05-22 NOTE — Progress Notes (Signed)
Subjective:     Patient ID: Amy Burnett, female   DOB: 1950/07/02, 64 y.o.   MRN: [id number]  HPI  Patient here for colposcopy and biopsy of small 4 mm red nonulcerated region at 3 o'clock on cervix. Noted on rouine annual exam. Pap 04/24/15 normal and negative HR HPV.   No hx of abnormal pap.  Review of Systems  LMP: Postmenopausal     Objective:   Physical Exam  Genitourinary:     Colposcopy with biopsy of cervix.  Consent for procedure.  Speculum placed.  3% acetic acid.  No acetowhite lesions.  Red flat lesion about 4 mm at 3:00.  Biopsy taken and lesion essentially removed.  Tissue to pathology.  Minimal bleeding.  Some atrophy bleeding from speculum placement. No complications.       Assessment:     Lesion of cervix.  Normal pap and negative HR HPV.     Plan:    Instructions and precautions given.  Follow up biopsy.   After visit summary to patient.

## 2015-05-22 NOTE — Patient Instructions (Signed)

## 2015-05-24 LAB — IPS OTHER TISSUE BIOPSY

## 2015-05-29 ENCOUNTER — Telehealth: Payer: Self-pay | Admitting: Emergency Medicine

## 2015-05-29 NOTE — Telephone Encounter (Signed)
-----   Message from Nunzio Cobbs, MD sent at 05/27/2015 11:30 AM EST ----- Please inform patient of her negative cervical biopsy results.  It showed chronic inflammation and underlying vascular congestion and hemorrhage.  This is not cancer nor precancer.  No further evaluation needed.  Cc- Marisa Sprinkles

## 2015-05-29 NOTE — Telephone Encounter (Signed)
Message left to return call to [facility] at [phone] for a non-urgent message from Dr. Quincy Simmonds.

## 2015-06-04 NOTE — Telephone Encounter (Signed)
Call to patient and she is given message from Dr. Quincy Simmonds.  Verbalized understanding of results.  Will follow up as needed. Routing to provider for final review. Patient agreeable to disposition. Will close encounter.

## 2015-06-05 ENCOUNTER — Encounter: Payer: Self-pay | Admitting: Neurology

## 2015-06-05 DIAGNOSIS — G2581 Restless legs syndrome: Secondary | ICD-10-CM

## 2015-06-05 MED ORDER — ROTIGOTINE 6 MG/24HR TD PT24: 1.0000 | MEDICATED_PATCH | Freq: Every day | TRANSDERMAL | Status: DC

## 2015-06-05 NOTE — Telephone Encounter (Signed)
6 mg total nupro dose. Let's go to one 6 mg patch- no peaks and valleys. CD

## 2015-06-16 DIAGNOSIS — M8580 Other specified disorders of bone density and structure, unspecified site: Secondary | ICD-10-CM

## 2015-06-16 HISTORY — DX: Other specified disorders of bone density and structure, unspecified site: M85.80

## 2015-08-06 DIAGNOSIS — E782 Mixed hyperlipidemia: Secondary | ICD-10-CM | POA: Insufficient documentation

## 2015-08-06 DIAGNOSIS — I10 Essential (primary) hypertension: Secondary | ICD-10-CM | POA: Insufficient documentation

## 2015-08-15 ENCOUNTER — Other Ambulatory Visit: Payer: Self-pay

## 2015-08-15 DIAGNOSIS — Z1231 Encounter for screening mammogram for malignant neoplasm of breast: Secondary | ICD-10-CM

## 2015-09-05 ENCOUNTER — Ambulatory Visit
Admission: RE | Admit: 2015-09-05 | Discharge: 2015-09-05 | Disposition: A | Discharge status: Home / Self Care | Payer: BLUE CROSS/BLUE SHIELD | Source: Ambulatory Visit

## 2015-09-05 ENCOUNTER — Ambulatory Visit
Admission: RE | Admit: 2015-09-05 | Discharge: 2015-09-05 | Disposition: A | Discharge status: Home / Self Care | Payer: BLUE CROSS/BLUE SHIELD | Source: Ambulatory Visit | Attending: Obstetrics and Gynecology | Admitting: Obstetrics and Gynecology

## 2015-09-05 DIAGNOSIS — Z780 Asymptomatic menopausal state: Secondary | ICD-10-CM

## 2015-09-05 DIAGNOSIS — Z1231 Encounter for screening mammogram for malignant neoplasm of breast: Secondary | ICD-10-CM

## 2015-09-05 HISTORY — DX: Other specified disorders of bone density and structure, unspecified site: M85.80

## 2015-09-25 ENCOUNTER — Ambulatory Visit (INDEPENDENT_AMBULATORY_CARE_PROVIDER_SITE_OTHER): Payer: BLUE CROSS/BLUE SHIELD

## 2015-09-25 ENCOUNTER — Ambulatory Visit (INDEPENDENT_AMBULATORY_CARE_PROVIDER_SITE_OTHER): Payer: BLUE CROSS/BLUE SHIELD | Admitting: Podiatry

## 2015-09-25 ENCOUNTER — Encounter: Payer: Self-pay | Admitting: Podiatry

## 2015-09-25 VITALS — BP 124/84 | HR 91 | Resp 16

## 2015-09-25 DIAGNOSIS — M779 Enthesopathy, unspecified: Secondary | ICD-10-CM | POA: Diagnosis not present

## 2015-09-25 DIAGNOSIS — M7662 Achilles tendinitis, left leg: Secondary | ICD-10-CM

## 2015-09-25 DIAGNOSIS — M7661 Achilles tendinitis, right leg: Secondary | ICD-10-CM | POA: Diagnosis not present

## 2015-09-25 DIAGNOSIS — M722 Plantar fascial fibromatosis: Secondary | ICD-10-CM

## 2015-09-25 MED ORDER — TRIAMCINOLONE ACETONIDE 10 MG/ML IJ SUSP
10.0000 mg | Freq: Once | INTRAMUSCULAR | Status: AC
  Administered 2015-09-25: 10 mg

## 2015-09-25 MED ORDER — PREDNISONE 10 MG PO TABS: ORAL_TABLET | ORAL | Status: DC

## 2015-09-25 NOTE — Patient Instructions (Addendum)
Achilles Tendinitis  with Rehab Achilles tendinitis is a disorder of the Achilles tendon. The Achilles tendon connects the large calf muscles (Gastrocnemius and Soleus) to the heel bone (calcaneus). This tendon is sometimes called the heel cord. It is important for pushing-off and standing on your toes and is important for walking, running, or jumping. Tendinitis is often caused by overuse and repetitive microtrauma. SYMPTOMS  Pain, tenderness, swelling, warmth, and redness may occur over the Achilles tendon even at rest.  Pain with pushing off, or flexing or extending the ankle.  Pain that is worsened after or during activity. CAUSES   Overuse sometimes seen with rapid increase in exercise programs or in sports requiring running and jumping.  Poor physical conditioning (strength and flexibility or endurance).  Running sports, especially training running down hills.  Inadequate warm-up before practice or play or failure to stretch before participation.  Injury to the tendon. PREVENTION   Warm up and stretch before practice or competition.  Allow time for adequate rest and recovery between practices and competition.  Keep up conditioning.  Keep up ankle and leg flexibility.  Improve or keep muscle strength and endurance.  Improve cardiovascular fitness.  Use proper technique.  Use proper equipment (shoes, skates).  To help prevent recurrence, taping, protective strapping, or an adhesive bandage may be recommended for several weeks after healing is complete. PROGNOSIS   Recovery may take weeks to several months to heal.  Longer recovery is expected if symptoms have been prolonged.  Recovery is usually quicker if the inflammation is due to a direct blow as compared with overuse or sudden strain. RELATED COMPLICATIONS   Healing time will be prolonged if the condition is not correctly treated. The injury must be given plenty of time to heal.  Symptoms can reoccur if  activity is resumed too soon.  Untreated, tendinitis may increase the risk of tendon rupture requiring additional time for recovery and possibly surgery. TREATMENT   The first treatment consists of rest anti-inflammatory medication, and ice to relieve the pain.  Stretching and strengthening exercises after resolution of pain will likely help reduce the risk of recurrence. Referral to a physical therapist or athletic trainer for further evaluation and treatment may be helpful.  A walking boot or cast may be recommended to rest the Achilles tendon. This can help break the cycle of inflammation and microtrauma.  Arch supports (orthotics) may be prescribed or recommended by your caregiver as an adjunct to therapy and rest.  Surgery to remove the inflamed tendon lining or degenerated tendon tissue is rarely necessary and has shown less than predictable results. MEDICATION   Nonsteroidal anti-inflammatory medications, such as aspirin and ibuprofen, may be used for pain and inflammation relief. Do not take within 7 days before surgery. Take these as directed by your caregiver. Contact your caregiver immediately if any bleeding, stomach upset, or signs of allergic reaction occur. Other minor pain relievers, such as acetaminophen, may also be used.  Pain relievers may be prescribed as necessary by your caregiver. Do not take prescription pain medication for longer than 4 to 7 days. Use only as directed and only as much as you need.  Cortisone injections are rarely indicated. Cortisone injections may weaken tendons and predispose to rupture. It is better to give the condition more time to heal than to use them. HEAT AND COLD  Cold is used to relieve pain and reduce inflammation for acute and chronic Achilles tendinitis. Cold should be applied for 10 to 15 minutes   every 2 to 3 hours for inflammation and pain and immediately after any activity that aggravates your symptoms. Use ice packs or an ice  massage.  Heat may be used before performing stretching and strengthening activities prescribed by your caregiver. Use a heat pack or a warm soak. SEEK MEDICAL CARE IF:  Symptoms get worse or do not improve in 2 weeks despite treatment.  New, unexplained symptoms develop. Drugs used in treatment may produce side effects.  EXERCISES:  RANGE OF MOTION (ROM) AND STRETCHING EXERCISES - Achilles Tendinitis  These exercises may help you when beginning to rehabilitate your injury. Your symptoms may resolve with or without further involvement from your physician, physical therapist or athletic trainer. While completing these exercises, remember:   Restoring tissue flexibility helps normal motion to return to the joints. This allows healthier, less painful movement and activity.  An effective stretch should be held for at least 30 seconds.  A stretch should never be painful. You should only feel a gentle lengthening or release in the stretched tissue.  STRETCH  Gastroc, Standing   Place hands on wall.  Extend right / left leg, keeping the front knee somewhat bent.  Slightly point your toes inward on your back foot.  Keeping your right / left heel on the floor and your knee straight, shift your weight toward the wall, not allowing your back to arch.  You should feel a gentle stretch in the right / left calf. Hold this position for 10 seconds. Repeat 3 times. Complete this stretch 2 times per day.  STRETCH  Soleus, Standing   Place hands on wall.  Extend right / left leg, keeping the other knee somewhat bent.  Slightly point your toes inward on your back foot.  Keep your right / left heel on the floor, bend your back knee, and slightly shift your weight over the back leg so that you feel a gentle stretch deep in your back calf.  Hold this position for 10 seconds. Repeat 3 times. Complete this stretch 2 times per day.  STRETCH  Gastrocsoleus, Standing  Note: This exercise can place  a lot of stress on your foot and ankle. Please complete this exercise only if specifically instructed by your caregiver.   Place the ball of your right / left foot on a step, keeping your other foot firmly on the same step.  Hold on to the wall or a rail for balance.  Slowly lift your other foot, allowing your body weight to press your heel down over the edge of the step.  You should feel a stretch in your right / left calf.  Hold this position for 10 seconds.  Repeat this exercise with a slight bend in your knee. Repeat 3 times. Complete this stretch 2 times per day.   STRENGTHENING EXERCISES - Achilles Tendinitis These exercises may help you when beginning to rehabilitate your injury. They may resolve your symptoms with or without further involvement from your physician, physical therapist or athletic trainer. While completing these exercises, remember:   Muscles can gain both the endurance and the strength needed for everyday activities through controlled exercises.  Complete these exercises as instructed by your physician, physical therapist or athletic trainer. Progress the resistance and repetitions only as guided.  You may experience muscle soreness or fatigue, but the pain or discomfort you are trying to eliminate should never worsen during these exercises. If this pain does worsen, stop and make certain you are following the directions exactly. If   the pain is still present after adjustments, discontinue the exercise until you can discuss the trouble with your clinician.  STRENGTH - Plantar-flexors   Sit with your right / left leg extended. Holding onto both ends of a rubber exercise band/tubing, loop it around the ball of your foot. Keep a slight tension in the band.  Slowly push your toes away from you, pointing them downward.  Hold this position for 10 seconds. Return slowly, controlling the tension in the band/tubing. Repeat 3 times. Complete this exercise 2 times per day.     STRENGTH - Plantar-flexors   Stand with your feet shoulder width apart. Steady yourself with a wall or table using as little support as needed.  Keeping your weight evenly spread over the width of your feet, rise up on your toes.*  Hold this position for 10 seconds. Repeat 3 times. Complete this exercise 2 times per day.  *If this is too easy, shift your weight toward your right / left leg until you feel challenged. Ultimately, you may be asked to do this exercise with your right / left foot only.  STRENGTH  Plantar-flexors, Eccentric  Note: This exercise can place a lot of stress on your foot and ankle. Please complete this exercise only if specifically instructed by your caregiver.   Place the balls of your feet on a step. With your hands, use only enough support from a wall or rail to keep your balance.  Keep your knees straight and rise up on your toes.  Slowly shift your weight entirely to your right / left toes and pick up your opposite foot. Gently and with controlled movement, lower your weight through your right / left foot so that your heel drops below the level of the step. You will feel a slight stretch in the back of your calf at the end position.  Use the healthy leg to help rise up onto the balls of both feet, then lower weight only on the right / left leg again. Build up to 15 repetitions. Then progress to 3 consecutive sets of 15 repetitions.*  After completing the above exercise, complete the same exercise with a slight knee bend (about 30 degrees). Again, build up to 15 repetitions. Then progress to 3 consecutive sets of 15 repetitions.* Perform this exercise 2 times per day.  *When you easily complete 3 sets of 15, your physician, physical therapist or athletic trainer may advise you to add resistance by wearing a backpack filled with additional weight.  STRENGTH - Plantar Flexors, Seated   Sit on a chair that allows your feet to rest flat on the ground. If  necessary, sit at the edge of the chair.  Keeping your toes firmly on the ground, lift your right / left heel as far as you can without increasing any discomfort in your ankle. Repeat 3 times. Complete this exercise 2 times a day.    Plantar Fasciitis (Heel Spur Syndrome) with Rehab The plantar fascia is a fibrous, ligament-like, soft-tissue structure that spans the bottom of the foot. Plantar fasciitis is a condition that causes pain in the foot due to inflammation of the tissue. SYMPTOMS   Pain and tenderness on the underneath side of the foot.  Pain that worsens with standing or walking. CAUSES  Plantar fasciitis is caused by irritation and injury to the plantar fascia on the underneath side of the foot. Common mechanisms of injury include:  Direct trauma to bottom of the foot.  Damage to a small   nerve that runs under the foot where the main fascia attaches to the heel bone.  Stress placed on the plantar fascia due to bone spurs. RISK INCREASES WITH:   Activities that place stress on the plantar fascia (running, jumping, pivoting, or cutting).  Poor strength and flexibility.  Improperly fitted shoes.  Tight calf muscles.  Flat feet.  Failure to warm-up properly before activity.  Obesity. PREVENTION  Warm up and stretch properly before activity.  Allow for adequate recovery between workouts.  Maintain physical fitness:  Strength, flexibility, and endurance.  Cardiovascular fitness.  Maintain a health body weight.  Avoid stress on the plantar fascia.  Wear properly fitted shoes, including arch supports for individuals who have flat feet.  PROGNOSIS  If treated properly, then the symptoms of plantar fasciitis usually resolve without surgery. However, occasionally surgery is necessary.  RELATED COMPLICATIONS   Recurrent symptoms that may result in a chronic condition.  Problems of the lower back that are caused by compensating for the injury, such as  limping.  Pain or weakness of the foot during push-off following surgery.  Chronic inflammation, scarring, and partial or complete fascia tear, occurring more often from repeated injections.  TREATMENT  Treatment initially involves the use of ice and medication to help reduce pain and inflammation. The use of strengthening and stretching exercises may help reduce pain with activity, especially stretches of the Achilles tendon. These exercises may be performed at home or with a therapist. Your caregiver may recommend that you use heel cups of arch supports to help reduce stress on the plantar fascia. Occasionally, corticosteroid injections are given to reduce inflammation. If symptoms persist for greater than 6 months despite non-surgical (conservative), then surgery may be recommended.   MEDICATION   If pain medication is necessary, then nonsteroidal anti-inflammatory medications, such as aspirin and ibuprofen, or other minor pain relievers, such as acetaminophen, are often recommended.  Do not take pain medication within 7 days before surgery.  Prescription pain relievers may be given if deemed necessary by your caregiver. Use only as directed and only as much as you need.  Corticosteroid injections may be given by your caregiver. These injections should be reserved for the most serious cases, because they may only be given a certain number of times.  HEAT AND COLD  Cold treatment (icing) relieves pain and reduces inflammation. Cold treatment should be applied for 10 to 15 minutes every 2 to 3 hours for inflammation and pain and immediately after any activity that aggravates your symptoms. Use ice packs or massage the area with a piece of ice (ice massage).  Heat treatment may be used prior to performing the stretching and strengthening activities prescribed by your caregiver, physical therapist, or athletic trainer. Use a heat pack or soak the injury in warm water.  SEEK IMMEDIATE MEDICAL  CARE IF:  Treatment seems to offer no benefit, or the condition worsens.  Any medications produce adverse side effects.  EXERCISES- RANGE OF MOTION (ROM) AND STRETCHING EXERCISES - Plantar Fasciitis (Heel Spur Syndrome) These exercises may help you when beginning to rehabilitate your injury. Your symptoms may resolve with or without further involvement from your physician, physical therapist or athletic trainer. While completing these exercises, remember:   Restoring tissue flexibility helps normal motion to return to the joints. This allows healthier, less painful movement and activity.  An effective stretch should be held for at least 30 seconds.  A stretch should never be painful. You should only feel a gentle lengthening   or release in the stretched tissue.  RANGE OF MOTION - Toe Extension, Flexion  Sit with your right / left leg crossed over your opposite knee.  Grasp your toes and gently pull them back toward the top of your foot. You should feel a stretch on the bottom of your toes and/or foot.  Hold this stretch for 10 seconds.  Now, gently pull your toes toward the bottom of your foot. You should feel a stretch on the top of your toes and or foot.  Hold this stretch for 10 seconds. Repeat  times. Complete this stretch 3 times per day.   RANGE OF MOTION - Ankle Dorsiflexion, Active Assisted  Remove shoes and sit on a chair that is preferably not on a carpeted surface.  Place right / left foot under knee. Extend your opposite leg for support.  Keeping your heel down, slide your right / left foot back toward the chair until you feel a stretch at your ankle or calf. If you do not feel a stretch, slide your bottom forward to the edge of the chair, while still keeping your heel down.  Hold this stretch for 10 seconds. Repeat 3 times. Complete this stretch 2 times per day.   STRETCH  Gastroc, Standing  Place hands on wall.  Extend right / left leg, keeping the front knee  somewhat bent.  Slightly point your toes inward on your back foot.  Keeping your right / left heel on the floor and your knee straight, shift your weight toward the wall, not allowing your back to arch.  You should feel a gentle stretch in the right / left calf. Hold this position for 10 seconds. Repeat 3 times. Complete this stretch 2 times per day.  STRETCH  Soleus, Standing  Place hands on wall.  Extend right / left leg, keeping the other knee somewhat bent.  Slightly point your toes inward on your back foot.  Keep your right / left heel on the floor, bend your back knee, and slightly shift your weight over the back leg so that you feel a gentle stretch deep in your back calf.  Hold this position for 10 seconds. Repeat 3 times. Complete this stretch 2 times per day.  STRETCH  Gastrocsoleus, Standing  Note: This exercise can place a lot of stress on your foot and ankle. Please complete this exercise only if specifically instructed by your caregiver.   Place the ball of your right / left foot on a step, keeping your other foot firmly on the same step.  Hold on to the wall or a rail for balance.  Slowly lift your other foot, allowing your body weight to press your heel down over the edge of the step.  You should feel a stretch in your right / left calf.  Hold this position for 10 seconds.  Repeat this exercise with a slight bend in your right / left knee. Repeat 3 times. Complete this stretch 2 times per day.   STRENGTHENING EXERCISES - Plantar Fasciitis (Heel Spur Syndrome)  These exercises may help you when beginning to rehabilitate your injury. They may resolve your symptoms with or without further involvement from your physician, physical therapist or athletic trainer. While completing these exercises, remember:   Muscles can gain both the endurance and the strength needed for everyday activities through controlled exercises.  Complete these exercises as instructed by  your physician, physical therapist or athletic trainer. Progress the resistance and repetitions only as guided.  STRENGTH -   Towel Curls  Sit in a chair positioned on a non-carpeted surface.  Place your foot on a towel, keeping your heel on the floor.  Pull the towel toward your heel by only curling your toes. Keep your heel on the floor. Repeat 3 times. Complete this exercise 2 times per day.  STRENGTH - Ankle Inversion  Secure one end of a rubber exercise band/tubing to a fixed object (table, pole). Loop the other end around your foot just before your toes.  Place your fists between your knees. This will focus your strengthening at your ankle.  Slowly, pull your big toe up and in, making sure the band/tubing is positioned to resist the entire motion.  Hold this position for 10 seconds.  Have your muscles resist the band/tubing as it slowly pulls your foot back to the starting position. Repeat 3 times. Complete this exercises 2 times per day.  Document Released: 06/01/2005 Document Revised: 08/24/2011 Document Reviewed: 09/13/2008 [employer] Patient Information 2014 [employer]  

## 2015-09-25 NOTE — Progress Notes (Signed)
   Subjective:    Patient ID: Amy Burnett, female    DOB: Jun 29, 1950, 65 y.o.   MRN: [id number]  HPI  Chief Complaint  Patient presents with  . Foot Pain    Posterior heel left and "the whole foot" right - patient states that the "bones" hurt when she walks on the right, unable to pinpoint an area of pain, swelling and stiffness post heel left for about 1 month, tired ice-no help       Review of Systems  All other systems reviewed and are negative.      Objective:   Physical Exam        Assessment & Plan:

## 2015-09-26 NOTE — Progress Notes (Signed)
Subjective:     Patient ID: Amy Burnett, female   DOB: 03-23-1951, 65 y.o.   MRN: [id number]  HPI patient presents with a lot of pain on the back of the left heel and also on the bottom of the right heel. States this is been going on now for several months and she's tried ice reduced activity and shoe gear modifications without relief   Review of Systems  All other systems reviewed and are negative.      Objective:   Physical Exam  Constitutional: She is oriented to person, place, and time.  Cardiovascular: Intact distal pulses.   Musculoskeletal: Normal range of motion.  Neurological: She is oriented to person, place, and time.  Skin: Skin is warm.  Nursing note and vitals reviewed.  neurovascular status found to be intact muscle strength adequate range of motion within normal limits with patient found to have exquisite pain posterior aspect left heel medial to the Achilles within the medial band and the central and lateral does not appear to be affected. Mild equinus condition was noted and tenderness in the plantar fascial right is also noted at the medial calcaneal tubercle. Patient has good digital perfusion and is well oriented 3     Assessment:     Acute Achilles tendinitis left with plantar fasciitis right which is most likely related to compensatory symptoms    Plan:     H&P and x-rays reviewed. Today I went ahead and advised on injection of the Achilles tendon left explaining risk and she wants procedure. I did a medial prep and then injected the medial portion of the Achilles 3 mg Dexon some Kenalog 5 mill grams Xylocaine and applied air fracture walker in order to immobilize. I then for the right injected the right plantar fascia 3 Milligan Kenalog 5 mg Xylocaine and advised on reduced activity for the next several days. Patient be seen back again in 3 weeks   X-ray report indicated there small posterior spur left with plantar spur bilateral and moderate depression of the  arch with no indication of stress fracture

## 2015-10-16 ENCOUNTER — Ambulatory Visit: Payer: BLUE CROSS/BLUE SHIELD | Admitting: Podiatry

## 2015-10-19 ENCOUNTER — Other Ambulatory Visit: Payer: Self-pay | Admitting: Neurology

## 2015-10-24 ENCOUNTER — Ambulatory Visit (INDEPENDENT_AMBULATORY_CARE_PROVIDER_SITE_OTHER): Payer: BLUE CROSS/BLUE SHIELD | Admitting: Neurology

## 2015-10-24 ENCOUNTER — Encounter: Payer: Self-pay | Admitting: Neurology

## 2015-10-24 VITALS — BP 138/88 | HR 88 | Resp 20 | Ht 67.0 in | Wt 230.0 lb

## 2015-10-24 DIAGNOSIS — G4733 Obstructive sleep apnea (adult) (pediatric): Secondary | ICD-10-CM | POA: Diagnosis not present

## 2015-10-24 DIAGNOSIS — G4719 Other hypersomnia: Secondary | ICD-10-CM

## 2015-10-24 DIAGNOSIS — G4710 Hypersomnia, unspecified: Secondary | ICD-10-CM | POA: Diagnosis not present

## 2015-10-24 DIAGNOSIS — Z9989 Dependence on other enabling machines and devices: Secondary | ICD-10-CM

## 2015-10-24 DIAGNOSIS — G4730 Sleep apnea, unspecified: Secondary | ICD-10-CM | POA: Diagnosis not present

## 2015-10-24 MED ORDER — MODAFINIL 200 MG PO TABS: 200.0000 mg | ORAL_TABLET | Freq: Every day | ORAL | Status: DC

## 2015-10-24 NOTE — Progress Notes (Addendum)
[facility]             SLEEP MEDICINE CLINIC                                                               PATIENT: Amy Burnett DOB: October 01, 1950   REASON FOR VISIT: Followup for restless legs, CPAP followed by DME.      HISTORY OF PRESENT ILLNESS:Amy Burnett, 65 year old caucasian, right handed female patient of Dr. Veronia Beets returns for followup.   The patient developed restless legs unrelated to blood loss, pregnancy, or any known Absorption difficulties. During her pregnancies she did not experience restless legs but developed a carpal tunnel syndrome in one pregnancy and in another had toxemia or HELP syndrome.   She was treated with Requip since 2006 and had an iron infusion., first at a lower dose but since than slowly has advanced to 4 mg daily -she takes 1 tablet twice in daytime and 2 tablets at night.  She has never been on the extended release dose of Requip. Also on Prometrium progesterone supplement on a Vivelle-Dot and takes Xanax as needed on a when necessary basis.  She is on Zoloft 2 of  100 mg tablets every morning for OCD.  She was diagnosed with low Ferritin, and has not taken oral iron after a singe iron infusion. This took place 8 years ago.   Chest with Christmas 2015 the patient had an exacerbation of sleep problems and restless legs which brings her also to the practice today. Her overall sleep time overnight at the time was probably less than 6 hours. Maybe closer to 5, she has no nocturia breaks reported. She has trouble falling asleep due to the restless legs restless legs however these seem not to wake her during her sleep at night.  Sleep is not extremely fragmented right now. Daytime sleepiness however has been a problem as the intake of her Requip dose of 1 mg correlates with excessive sleepiness occurring about 30-45 minutes later. She is currently on Ropinirole 4 mg total dose.She says she was in 2013 diagnosed with sleep apnea and is  using CPAP at 4 cm. She was evaluated at [facility], by Dr. Moshe Salisbury.  She gets no regular exercise. She knows she needs to lose some weight.    Interval  history from 10-24-14, Amy Burnett restless leg syndrome has been well controlled under the use of 2 new poor patches and when necessary use of an additional milligram of oral Requip. She recently could fly for toward a half hours without onset of symptoms which she has not been able to do in the past. She is able to sustain sound sleep at night. Her Epworth Sleepiness Scale is endorsed at 9 points and her fatigue severity score at 23 points. She does not feel depressed. She does not report any micro-sleep attacks now. She would be able to drive she is able to follow conversations without falling asleep. In addition the patient still has a condition of obstructive sleep apnea and is using CPAP. She has been diagnosed by Dr. Moshe Salisbury  and is followed by her sleep clinic on  [facility], Richardean Canal was her sleep technologist.  Interval history from 10/24/2015, Amy Burnett is here today with  her compliance report from her CPAP. She has not been able to use the CPAP machine over 4 hours nightly on average only 2 hours and 11 minutes. The machine is set at 8 cm water without EPR, the residual AHI is 2.0. She does not have significant air leaks. She reports that he on average anyway sleeps only 4-5 hours however this is not reflected in her compliance. She thinks that she inadvertently takes it off. However the compliance is poor. She is not afraid to use it she usually enjoys using it. Reflected is today of very high degree of daytime sleepiness was 21 out of 24 points. And the geriatric depression score was endorsed at 2 out of 15.  The patch that she has used to controlled her restless legs continues to work well -she's not woken up by restless legs at night.     REVIEW OF SYSTEMS: Full 14 system review of systems performed and notable only for:  EDS,  Epworth  21. Very high  RLS, obesity, but peripheral visual misperceptions ( drug induced ) . neupro patch induced.    No longer any microsleeps,   ALLERGIES: No Known Allergies  HOME MEDICATIONS: Outpatient Prescriptions Prior to Visit  Medication Sig Dispense Refill  . hydrochlorothiazide (HYDRODIURIL) 25 MG tablet Take 25 mg by mouth every morning.  0  . rotigotine (NEUPRO) 6 MG/24HR Place 1 patch onto the skin daily. 30 patch 12  . sertraline (ZOLOFT) 100 MG tablet Take 200 mg by mouth every morning.     . predniSONE (DELTASONE) 10 MG tablet 12 day tapering dose 48 tablet 0   No facility-administered medications prior to visit.    PAST MEDICAL HISTORY: Past Medical History  Diagnosis Date  . History of kidney stones   . OSA on CPAP   . Anxiety   . History of panic attacks   . Rectal mass   . RLS (restless legs syndrome)   . Heart murmur   . 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 Tonsillectomy and adenoidectomy  age 1  . Colonoscopy  03/09/2006  . Eus N/A 06/01/2013    Procedure: LOWER ENDOSCOPIC ULTRASOUND (EUS);  Surgeon: Milus Banister, MD;  Location: [facility];  Service: Endoscopy;  Laterality: N/A;  . Eus N/A 12/06/2014    Procedure: LOWER ENDOSCOPIC ULTRASOUND (EUS);  Surgeon: Milus Banister, MD;  Location: [facility];  Service: Endoscopy;  Laterality: N/A;  . Left ureteroscopic laser lithotripsy stone extraction/ stent placement  12-13-2001  . D & c hysteroscopy/  resection myoma and endometrial polyp  09-23-2009  . D & c hysteroscopy w/ resection myoma  01-28-2009  . Transthoracic echocardiogram  09-16-2009    mild LVH,  ef 60-65%,  grade I diastolic dysfunction/  trivial MR and TR  . Cardiovascular stress test  09-19-2009    normal perfusion study/  ef 80%  . Transanal excision of rectal mass N/A 04/11/2015    Procedure: TRANSANAL EXCISION OF DISTAL RECTAL MASS;  Surgeon: [name], MD;  Location: [facility];  Service: General;  Laterality: N/A;    FAMILY HISTORY: Family History  Problem Relation Age of Onset  . Breast cancer Mother 66  . Pancreatic cancer Mother 89    SOCIAL HISTORY: Social History   Social History  . Marital Status: Married    Spouse Name: [name]  . Number of Children: 2  .  Years of Education: college   Occupational History  .      full time works for her self   Social History Main Topics  . Smoking status: Former Smoker -- 15 years    Types: Cigarettes    Quit date: 06/15/1985  . Smokeless tobacco: Never Used  . Alcohol Use: 2.4 oz/week    4 Glasses of wine per week     Comment: with dinner  . Drug Use: No  . Sexual Activity: No   Other Topics Concern  . Not on file   Social History Narrative   Patient lives at home with her husband [name])    Patient works full time   Education- college   Right handed   Caffeine- diet coke three daily     PHYSICAL EXAM  Filed Vitals:   10/24/15 1042  BP: 138/88  Pulse: 88  Resp: 20  Height: _0  (1.702 m)  Weight: 230 lb (104.327 kg)   Body mass index is 36.01 kg/(m^2).  General: The patient was asleep in the waiting  Room. Now  alert and appears not in acute distress. The patient is well groomed. Head: Normocephalic, atraumatic. Neck is supple. Mallampati 4, neck circumference: 16.5  inches  Cardiovascular:  Regular rate and rhythm , without  murmurs or carotid bruit, and without distended neck veins. Respiratory: Lungs are clear to auscultation. Skin:  Without evidence of edema, or rash Trunk: BMI is severely elevated and patient  has normal posture.   Neurologic exam : The patient is  oriented to place and time.  " groggy "  Memory subjective described as intact.  There is a normal attention span & concentration ability.  Speech is fluent without dysarthria, dysphonia or aphasia. Mood and affect are appropriate.  Cranial nerves: No change in  taste or smell. Pupils are equal and briskly reactive to light. Extraocular movements  in vertical and horizontal planes intact and without nystagmus. Visual fields by finger perimetry are intact.Hearing to finger rub intact. Facial sensation intact to fine touch.  Facial motor strength is symmetric and tongue and uvula move midline.  Assessment:  After physical and neurologic examination, review of laboratory studies, imaging, neurophysiology testing and pre-existing records, assessment established after 15 minute of visit time, more than 50% of this face a to face  time is spent in education, explanation of differential diagnosis and treatment options. .  Plan:  Treatment plan and additional workup : refilled patches and oral Requip.       25 minutes , over 20 minutes of  face-to-face visit with the patient of over 8 years.   Mrs. Printup has a long-standing history of restless legs and was initially helped by a iron infusion in 2008.  2016 iron studies returned normal. She is no controlling her restless legs with Neupro patch at 6 mg dose. She is excessively daytime sleepy. Her apnea is well controlled at her compliance with the CPAP is low. The Epworth score of 21 points is truly remarkable, and I would like to offer her Nuvigil for the daytime. I will also draw an HLA narcolepsy test.  ASSESSMENT AND PLAN  65 y.o. year old female here to followup for restless legs.  Currently on  2 mg patch  Two patches, and Requip  Oral dose  Prn 1 mg . Less daytime  daytime sleepiness with the patches  She had micro-sleep attacks on oral Requip . She also still wasn't able to sustain a longer train ride  or plane flight etc. she recently flew for about 2-1/2 hours and had no symptoms.   Hypersomnia, OSA well controlled but CPAP compliance is low - unclear why?  Modafinil and  HLA narcolepsy test.   Larey Seat, MD   Addendum from 12/23/2015. Mrs. McKees CPAP machine was investigated for defects, and  a failure of the power cord had been noted. This part has been replaced. The patient and her durable medical equipment company asked me to write this addendum to clarify why the patient's compliance was low, partially because her machine would not be able to record functioning.[facility] [address] [place], [place] [address] [phone]

## 2015-11-01 ENCOUNTER — Other Ambulatory Visit: Payer: Self-pay | Admitting: Neurology

## 2015-11-05 ENCOUNTER — Telehealth: Payer: Self-pay

## 2015-11-05 NOTE — Telephone Encounter (Signed)
Called pt to discuss HLA results (positive). No answer, left a message asking her to call me back.

## 2015-11-13 NOTE — Telephone Encounter (Signed)
I spoke to pt and advised her that her HLA test came back positive. Pt says she is already on provigil for sleepiness and cpap. She will follow up with Hoyle Sauer, NP in August. Pt verbalized understanding of results. Pt had no questions at this time but was encouraged to call back if questions arise.

## 2015-11-13 NOTE — Telephone Encounter (Signed)
Pt returned Kristen's call °

## 2015-11-20 ENCOUNTER — Encounter: Payer: Self-pay | Admitting: Neurology

## 2015-12-06 ENCOUNTER — Encounter: Payer: Self-pay | Admitting: Neurology

## 2015-12-11 ENCOUNTER — Telehealth: Payer: Self-pay

## 2015-12-11 NOTE — Telephone Encounter (Signed)
PA for modafinil approved by [employer]. Effective date: 06/14/2015 to 06/14/2016. [id number]

## 2015-12-11 NOTE — Telephone Encounter (Signed)
I completed the modafinil pa for [facility]. Should have a determination in 5-7 business days.

## 2015-12-18 ENCOUNTER — Telehealth: Payer: Self-pay | Admitting: Neurology

## 2015-12-18 DIAGNOSIS — G4733 Obstructive sleep apnea (adult) (pediatric): Secondary | ICD-10-CM

## 2015-12-18 DIAGNOSIS — Z9989 Dependence on other enabling machines and devices: Secondary | ICD-10-CM

## 2015-12-18 NOTE — Telephone Encounter (Signed)
Pt called sts CPAP will not turn on. She has called [facility] and was told it needed to be repaired or replaced. She sts it is about 65 yrs old. The RX was from a NP that she is not seeing anymore and is wanting to get RX from Dr Brett Fairy. Pt is aware Dr Brett Fairy is out of the office but she would like this taken care of this week.

## 2015-12-19 ENCOUNTER — Encounter: Payer: Self-pay | Admitting: Podiatry

## 2015-12-19 ENCOUNTER — Ambulatory Visit (INDEPENDENT_AMBULATORY_CARE_PROVIDER_SITE_OTHER): Payer: Medicare HMO | Admitting: Podiatry

## 2015-12-19 VITALS — BP 127/77 | HR 87 | Resp 16

## 2015-12-19 DIAGNOSIS — M7661 Achilles tendinitis, right leg: Secondary | ICD-10-CM

## 2015-12-19 DIAGNOSIS — M7662 Achilles tendinitis, left leg: Secondary | ICD-10-CM | POA: Diagnosis not present

## 2015-12-19 DIAGNOSIS — M7672 Peroneal tendinitis, left leg: Secondary | ICD-10-CM

## 2015-12-19 MED ORDER — TRIAMCINOLONE ACETONIDE 10 MG/ML IJ SUSP
10.0000 mg | Freq: Once | INTRAMUSCULAR | Status: AC
  Administered 2015-12-19: 10 mg

## 2015-12-19 NOTE — Progress Notes (Signed)
Subjective:     Patient ID: Amy Burnett, female   DOB: [birth year], 65 y.o.   MRN: [id number]  HPI patient states my Achilles left does not seem to be bothering me as much on the inside as it is on the outside and it has been sore and I've had trouble wearing my boot because of instability but I need something to stretch my foot and the bottom of my heel   Review of Systems     Objective:   Physical Exam Neurovascular status intact with discomfort which is now more prevalent on the lateral side of the Achilles with inflammation noted with improvement of the medial    Assessment:     Achilles tendinitis left lateral versus medial    Plan:     Reviewed condition and did careful injection of the lateral Achilles 3 mg dexamethasone Kenalog 5 mg Xylocaine after explaining chances for rupture and placed in night splint in order to provide for plantar and posterior support along with ice therapy weight if symptoms persistConsider shockwave therapy

## 2015-12-19 NOTE — Telephone Encounter (Signed)
Order sent to [facility] °

## 2015-12-19 NOTE — Telephone Encounter (Signed)
Dr. Brett Fairy is out of the office this week and this message will have to be addressed by Dr. Rexene Alberts since pt wants this taken care of this week.  Dr. Rexene Alberts, are you agreeable to signing an order for [facility] to repair pt's cpap? Dr. Brett Fairy sees her for RLS. She was followed by a sleep center on [address] that is now closed.

## 2015-12-19 NOTE — Telephone Encounter (Signed)
Repair for CPAP unit order placed in chart, please process.

## 2015-12-23 ENCOUNTER — Telehealth: Payer: Self-pay

## 2015-12-23 DIAGNOSIS — G4733 Obstructive sleep apnea (adult) (pediatric): Secondary | ICD-10-CM

## 2015-12-23 NOTE — Telephone Encounter (Signed)
Patient's request is justified. I will addend. CD

## 2015-12-23 NOTE — Telephone Encounter (Signed)
[facility] needs Dr. Edwena Felty last note to be addended to reflect the reason why she was noncompliant with her cpap. (see previous documentation from [facility] in this message).  Order for supplies generated.

## 2015-12-23 NOTE — Telephone Encounter (Signed)
-----   Message from Leeroy Bock sent at 12/20/2015  2:54 PM EDT ----- Well, here is what we figured out.  We have not been following her.  Her service provider was [facility]. The Patient talked to them today and they determined that it was a power cord issue and she is getting  A new power cord for her machine.  So we are good there.  However she is wanting to transition to [place] for her supplies going forward.  In order to do that we need an rx with supplies listed (itemized).  We were able to obtain a copy of the patients SS so we are good there.  We looked over her last ovn with you guys.  We would just need an amendment made to discus why she has been noncompliant (machine not working?)  ----- Message -----    From: Lester Maltby, RN    Sent: 12/20/2015   9:47 AM      To: Leeroy Bock  I think [facility] was servicing the pt before all of this. We had not followed her for sleep apnea, but her sleep apnea provider is no longer in business. ----- Message -----    From: Leeroy Bock    Sent: 12/20/2015   9:41 AM      To: Cyril Mourning Phenix Vandermeulen, RN  It looks like we can replace the patients unit.  However in order to, we need an rx for a replacement PAP and supplies along with pressure settings.  We also need a copy of patients SS.  I didn't see one in the patients chart?? ----- Message -----    From: Lester [place], RN    Sent: 12/19/2015   8:14 AM      To: Leeroy Bock  New order in!

## 2015-12-31 ENCOUNTER — Telehealth: Payer: Self-pay | Admitting: Neurology

## 2015-12-31 NOTE — Telephone Encounter (Signed)
I spoke to [place] at [facility]. I advised him that I do not know what a replacement form is and have not received one. Marya Amsler will refax one to me. Pt needs this form filled out to get her neupro. Marya Amsler explained to me and the replacement form is for when the pt calls them to complain about their product ( such as the patches aren't sticking or wet or something).

## 2015-12-31 NOTE — Telephone Encounter (Signed)
[facility]le[facility] called asking that the replacement form for rotigotine (NEUPRO) 6 MG/24HR be sent back to them so they can ship the medication to the pt. May call [phone]

## 2016-01-01 NOTE — Telephone Encounter (Signed)
Replacement form received and filled out. Faxed back to [employer]. Received a receipt of confirmation.

## 2016-01-24 ENCOUNTER — Ambulatory Visit: Payer: BLUE CROSS/BLUE SHIELD | Admitting: Nurse Practitioner

## 2016-01-28 ENCOUNTER — Ambulatory Visit (INDEPENDENT_AMBULATORY_CARE_PROVIDER_SITE_OTHER): Payer: Medicare HMO | Admitting: Nurse Practitioner

## 2016-01-28 ENCOUNTER — Encounter: Payer: Self-pay | Admitting: Nurse Practitioner

## 2016-01-28 VITALS — BP 133/89 | HR 94 | Ht 67.0 in | Wt 238.8 lb

## 2016-01-28 DIAGNOSIS — G4733 Obstructive sleep apnea (adult) (pediatric): Secondary | ICD-10-CM

## 2016-01-28 DIAGNOSIS — G4730 Sleep apnea, unspecified: Secondary | ICD-10-CM | POA: Diagnosis not present

## 2016-01-28 DIAGNOSIS — G4710 Hypersomnia, unspecified: Secondary | ICD-10-CM | POA: Diagnosis not present

## 2016-01-28 DIAGNOSIS — G2581 Restless legs syndrome: Secondary | ICD-10-CM | POA: Diagnosis not present

## 2016-01-28 DIAGNOSIS — Z9989 Dependence on other enabling machines and devices: Secondary | ICD-10-CM

## 2016-01-28 NOTE — Progress Notes (Signed)
I agree with the assessment and plan as directed by NP .The patient is known to me .   Yu Cragun, MD  

## 2016-01-28 NOTE — Progress Notes (Signed)
I agree with the assessment and plan as directed by NP .The patient is known to me .   Alyviah Crandle, MD  

## 2016-01-28 NOTE — Progress Notes (Signed)
[facility]  PATIENT: Amy Burnett DOB: Sep 17, 1950   REASON FOR VISIT: Follow-up for restless legs obstructive sleep apnea with CPAP HISTORY FROM: Patient    HISTORY OF PRESENT ILLNESS:Amy Burnett, 65 year old caucasian, right handed female patient of Dr. Veronia Burnett returns for followup.   The patient developed restless legs unrelated to blood loss, pregnancy, or any known Absorption difficulties. During her pregnancies she did not experience restless legs but developed a carpal tunnel syndrome in one pregnancy and in another had toxemia or HELP syndrome.   She was treated with Requip since 2006 and had an iron infusion., first at a lower dose but since than slowly has advanced to 4 mg daily -she takes 1 tablet twice in daytime and 2 tablets at night.  She has never been on the extended release dose of Requip. Also on Prometrium progesterone supplement on a Vivelle-Dot and takes Xanax as needed on a when necessary basis.  She is on Zoloft 2 of  100 mg tablets every morning for OCD.  She was diagnosed with low Ferritin, and has not taken oral iron after a singe iron infusion. This took place 8 years ago.   Chest with Christmas 2015 the patient had an exacerbation of sleep problems and restless legs which brings her also to the practice today. Her overall sleep time overnight at the time was probably less than 6 hours. Maybe closer to 5, she has no nocturia breaks reported. She has trouble falling asleep due to the restless legs restless legs however these seem not to wake her during her sleep at night.  Sleep is not extremely fragmented right now. Daytime sleepiness however has been a problem as the intake of her Requip dose of 1 mg correlates with excessive sleepiness occurring about 30-45 minutes later. She is currently on Ropinirole 4 mg total dose.She says she was in 2013 diagnosed with sleep apnea and is using CPAP at 4 Amy. She was evaluated at [facility], by Dr.  Moshe Burnett.  She gets no regular exercise. She knows she needs to lose some weight.    Interval  history from 10-24-14, Amy Burnett restless leg syndrome has been well controlled under the use of 2 new poor patches and when necessary use of an additional milligram of oral Requip. She recently could fly for toward a half hours without onset of symptoms which she has not been able to do in the past. She is able to sustain sound sleep at night. Her Epworth Sleepiness Scale is endorsed at 9 points and her fatigue severity score at 23 points. She does not feel depressed. She does not report any micro-sleep attacks now. She would be able to drive she is able to follow conversations without falling asleep. In addition the patient still has a condition of obstructive sleep apnea and is using CPAP. She has been diagnosed by Dr. Moshe Burnett  and is followed by her sleep clinic on  [facility], Amy Burnett was her sleep technologist.  Interval history from 10/24/2015,Amy Burnett is here today with her compliance report from her CPAP. She has not been able to use the CPAP machine over 4 hours nightly on average only 2 hours and 11 minutes. The machine is set at 8 Amy water without EPR, the residual AHI is 2.0. She does not have significant air leaks. She reports that he on average anyway sleeps only 4-5 hours however this is not reflected in her compliance. She thinks that she inadvertently  takes it off. However the compliance is poor. She is not afraid to use it she usually enjoys using it. Reflected is today of very high degree of daytime sleepiness was 21 out of 24 points. And the geriatric depression score was endorsed at 2 out of 15. The patch that she has used to controlled her restless legs continues to work well -she's not woken up by restless legs at night. UPDATE 08/15/2017CM Ms. Amy Burnett, 65 year old female returns for follow-up. She has obstructive sleep apnea on CPAP. 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 CPAP compliance today usage 29 out of 30 days for 97% average usage 5 hours 51 minutes. Pressure set at 8 Amy AHI 2.5. Minimal leak. ESS today 12. She is on Provigil with good response, she says it has really made a difference. HLA was positive. Explained to the patient the test actually means. She remains on Neupro for restless legs. Reviewed how to correctly put the patch on. She returns for reevaluation   REVIEW OF SYSTEMS: Full 14 system review of systems performed and notable only for those listed, all others are neg:  Constitutional: neg  Cardiovascular: neg Ear/Nose/Throat: neg  Skin: neg Eyes: neg Respiratory: neg Gastroitestinal: neg  Hematology/Lymphatic: neg  Endocrine: neg Musculoskeletal:neg Allergy/Immunology: neg Neurological: neg Psychiatric: Anxiety  Sleep : Restless legs obstructive sleep apnea with CPAP   ALLERGIES: No Known Allergies  HOME MEDICATIONS: Outpatient Medications Prior to Visit  Medication Sig Dispense Refill  . hydrochlorothiazide (HYDRODIURIL) 25 MG tablet Take 25 mg by mouth every morning.  0  . modafinil (PROVIGIL) 200 MG tablet Take 1 tablet (200 mg total) by mouth daily. 30 tablet 5  . rotigotine (NEUPRO) 6 MG/24HR Place 1 patch onto the skin daily. 30 patch 12  . sertraline (ZOLOFT) 100 MG tablet Take 200 mg by mouth every morning.      No facility-administered medications prior to visit.     PAST MEDICAL HISTORY: Past Medical History:  Diagnosis Date  . Anxiety   . Heart murmur   . History of kidney stones   . History of panic attacks   . OSA on CPAP   . Osteopenia, senile 2017   hip  . Rectal mass   . RLS (restless legs syndrome)     PAST SURGICAL HISTORY: Past Surgical History:  Procedure Laterality Date  . CARDIOVASCULAR STRESS TEST  09-19-2009   normal perfusion study/  ef 80%  . COLONOSCOPY  03/09/2006  . D & C HYSTEROSCOPY W/ RESECTION MYOMA  01-28-2009  . D & C HYSTEROSCOPY/  RESECTION MYOMA AND ENDOMETRIAL  POLYP  09-23-2009  . EUS N/A 06/01/2013   Procedure: LOWER ENDOSCOPIC ULTRASOUND (EUS);  Surgeon: Milus Banister, MD;  Location: [facility];  Service: Endoscopy;  Laterality: N/A;  . EUS N/A 12/06/2014   Procedure: LOWER ENDOSCOPIC ULTRASOUND (EUS);  Surgeon: Milus Banister, MD;  Location: [facility];  Service: Endoscopy;  Laterality: N/A;  . LEFT URETEROSCOPIC LASER LITHOTRIPSY STONE EXTRACTION/ STENT PLACEMENT  12-13-2001  . TONSILLECTOMY AND ADENOIDECTOMY  age 34  . TRANSANAL EXCISION OF RECTAL MASS N/A 04/11/2015   Procedure: TRANSANAL EXCISION OF DISTAL RECTAL MASS;  Surgeon: [name], MD;  Location: [facility];  Service: General;  Laterality: N/A;  . TRANSTHORACIC ECHOCARDIOGRAM  09-16-2009   mild LVH,  ef 60-65%,  grade I diastolic dysfunction/  trivial MR and TR    FAMILY HISTORY: Family History  Problem Relation Age of Onset  . Breast cancer Mother 61  .  Pancreatic cancer Mother 71    SOCIAL HISTORY: Social History   Social History  . Marital status: Married    Spouse name: [name]  . Number of children: 2  . Years of education: college   Occupational History  .  Self-Employed    full time works for her self   Social History Main Topics  . Smoking status: Former Smoker    Years: 15.00    Types: Cigarettes    Quit date: 06/15/1985  . Smokeless tobacco: Never Used  . Alcohol use 2.4 oz/week    4 Glasses of wine per week     Comment: with dinner  . Drug use: No  . Sexual activity: No   Other Topics Concern  . Not on file   Social History Narrative   Patient lives at home with her husband [name])    Patient works full time   Education- college   Right handed   Caffeine- diet coke three daily     PHYSICAL EXAM  Vitals:   01/28/16 1507  BP: 133/89  Pulse: 94  Weight: 238 lb 12.8 oz (108.3 kg)  Height: 5' 7"  (1.702 m)   Body mass index is 37.4 kg/m.  Generalized: Well developed, in no acute distress  Head: normocephalic and  atraumatic,. Oropharynx benign mallopati 4,  Neck: Supple, no carotid bruits neck size 16.5 inches.   Cardiac: Regular rate rhythm, no murmur  Musculoskeletal: No deformity   Neurological examination   Mentation: Alert oriented to time, place, history taking. Attention span and concentration appropriate. Recent and remote memory intact.  Follows all commands speech and language fluent. ESS 12. FSS 18.   Cranial nerve II-XII: .Pupils were equal round reactive to light extraocular movements were full, visual field were full on confrontational test. Facial sensation and strength were normal. hearing was intact to finger rubbing bilaterally. Uvula tongue midline. head turning and shoulder shrug were normal and symmetric.Tongue protrusion into cheek strength was normal. Motor: normal bulk and tone, full strength in the BUE, BLE, fine finger movements normal, no pronator drift. No focal weakness Coordination: finger-nose-finger, heel-to-shin bilaterally, no dysmetria Reflexes: Brachioradialis 2/2, biceps 2/2, triceps 2/2, patellar 2/2, Achilles 2/2, plantar responses were flexor bilaterally. Gait and Station: Rising up from seated position without assistance, normal stance,  moderate stride, good arm swing, smooth turning, able to perform tiptoe, and heel walking without difficulty. Tandem gait is steady  DIAGNOSTIC DATA (LABS, IMAGING, TESTING) -  ASSESSMENT AND PLAN  65 y.o. year old female  has a past medical history of Anxiety; History of kidney stones; History of panic attacks; OSA on CPAP; Osteopenia, senile (2017);  and RLS (restless legs syndrome). here to follow-up. 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

## 2016-01-28 NOTE — Patient Instructions (Signed)
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

## 2016-02-04 DIAGNOSIS — Z0100 Encounter for examination of eyes and vision without abnormal findings: Secondary | ICD-10-CM | POA: Diagnosis not present

## 2016-02-19 ENCOUNTER — Encounter: Payer: Self-pay | Admitting: Podiatry

## 2016-02-19 ENCOUNTER — Ambulatory Visit (INDEPENDENT_AMBULATORY_CARE_PROVIDER_SITE_OTHER): Payer: Medicare HMO | Admitting: Podiatry

## 2016-02-19 DIAGNOSIS — M7661 Achilles tendinitis, right leg: Secondary | ICD-10-CM | POA: Diagnosis not present

## 2016-02-19 DIAGNOSIS — M7662 Achilles tendinitis, left leg: Secondary | ICD-10-CM

## 2016-02-20 NOTE — Progress Notes (Signed)
Subjective:     Patient ID: Amy Burnett, female   DOB: 1950/12/04, 65 y.o.   MRN: [id number]  HPI patient states she continues to have persistent pain in the posterior aspect of the left heel with relief that lasted for several weeks followed by significant reoccurrence   Review of Systems     Objective:   Physical Exam Neurovascular status intact muscle strength adequate with patient found to have discomfort in the posterior heel left at the insertional point of the Achilles both medial and lateral with lateral being worse    Assessment:     Inflammatory Achilles tendinitis which has not responded so far to immobilization and injection treatment along with physical therapy    Plan:     Reviewed condition at great length and I've recommended shockwave therapy discussing if this does not get better ultimately surgery may be necessary. Patient is scheduled for shockwave and will be seen back and was given instructions on the importance of immobilization ice and reappoint for treatment

## 2016-02-25 ENCOUNTER — Ambulatory Visit: Payer: Medicare HMO

## 2016-02-25 DIAGNOSIS — M7661 Achilles tendinitis, right leg: Secondary | ICD-10-CM

## 2016-02-25 DIAGNOSIS — M7662 Achilles tendinitis, left leg: Principal | ICD-10-CM

## 2016-02-25 NOTE — Progress Notes (Signed)
Pt presents with ongoing pain in her left foot, posterior aspect of Lt heel since March 2017.   Pain on palpation of posterior aspect of Lt heel/ achilles area  ESWT administered to affected area for 3.5 joules for 3000 pulses, and tolerated will with episodes of pain verbalized at 8 of 10. EPAT delivered to surrounding connective tissues for 2000 pulses. Advised against use of anti-inflammatories and ice and utilization of her CAM boot. Re-appointed in 1 week for 2nd treatment

## 2016-03-03 ENCOUNTER — Ambulatory Visit (INDEPENDENT_AMBULATORY_CARE_PROVIDER_SITE_OTHER): Payer: Medicare HMO

## 2016-03-03 DIAGNOSIS — M7661 Achilles tendinitis, right leg: Secondary | ICD-10-CM

## 2016-03-03 DIAGNOSIS — M7662 Achilles tendinitis, left leg: Secondary | ICD-10-CM

## 2016-03-03 NOTE — Progress Notes (Signed)
Pt presents with ongoing pain in her left foot, posterior aspect of Lt heel since March 2017. She states there has not been much change in her pain   Pain on palpation of posterior aspect of Lt heel/ achilles area  ESWT administered to affected area for 5 joules for 3000 pulses, and tolerated will with episodes of pain verbalized at 8 of 10. EPAT delivered to surrounding connective tissues for 2000 pulses. Advised against use of anti-inflammatories and ice and utilization of her CAM boot. Re-appointed in 1 week for 3rd treatment

## 2016-03-10 ENCOUNTER — Ambulatory Visit (INDEPENDENT_AMBULATORY_CARE_PROVIDER_SITE_OTHER): Payer: Medicare HMO

## 2016-03-10 DIAGNOSIS — M7661 Achilles tendinitis, right leg: Secondary | ICD-10-CM

## 2016-03-10 DIAGNOSIS — M7662 Achilles tendinitis, left leg: Secondary | ICD-10-CM

## 2016-03-10 NOTE — Progress Notes (Signed)
Pt presents with ongoing pain in her left foot, posterior aspect of Lt heel since March 2017. She states that she has noticed some improvement in her foot pain   Pain on palpation of posterior aspect of Lt heel/ achilles area  ESWT administered to affected area for 6 joules for 3000 pulses, and tolerated will with episodes of pain verbalized at 8 of 10. EPAT delivered to surrounding connective tissues for 2000 pulses. Advised against use of anti-inflammatories and ice and utilization of her CAM boot. Re-appointed in 4 weeks for 4th treatment

## 2016-03-25 ENCOUNTER — Encounter: Payer: Self-pay | Admitting: Internal Medicine

## 2016-04-14 ENCOUNTER — Ambulatory Visit (INDEPENDENT_AMBULATORY_CARE_PROVIDER_SITE_OTHER): Payer: Medicare HMO

## 2016-04-14 DIAGNOSIS — M722 Plantar fascial fibromatosis: Secondary | ICD-10-CM

## 2016-04-14 DIAGNOSIS — M7661 Achilles tendinitis, right leg: Secondary | ICD-10-CM

## 2016-04-14 DIAGNOSIS — M7662 Achilles tendinitis, left leg: Secondary | ICD-10-CM

## 2016-04-14 NOTE — Progress Notes (Signed)
Pt presents with ongoing pain in her left foot, posterior aspect of Lt heel since March 2017. She states that she has noticed some improvement in her foot pain   Pain on palpation of posterior aspect of Lt heel/ achilles area, with noted redness and swelling  ESWT administered to affected area for 6 joules for 3000 pulses, and tolerated will with episodes of pain verbalized at 8 of 10. EPAT delivered to surrounding connective tissues for 2000 pulses. Advised against use of anti-inflammatories and ice and utilization of her CAM boot. Re-appointed in 6 weeks to be re-evaluated by Dr Paulla Dolly

## 2016-04-15 DIAGNOSIS — R69 Illness, unspecified: Secondary | ICD-10-CM | POA: Diagnosis not present

## 2016-04-24 ENCOUNTER — Other Ambulatory Visit: Payer: Self-pay | Admitting: Neurology

## 2016-04-28 NOTE — Telephone Encounter (Signed)
RX for modafinil faxed to [facility] on [address]. Received a receipt of confirmation.

## 2016-05-15 ENCOUNTER — Encounter: Payer: Self-pay | Admitting: Nurse Practitioner

## 2016-05-20 NOTE — Progress Notes (Signed)
65 y.o. G42P2002 Married Caucasian female here for annual exam.    No vaginal bleeding or spotting.   Some vaginal dryness.  Had biopsy of cervix last year at 3:00 position and it showed benign chronic inflammation and underlying vascular congestion and hemorrhage.   Dealing with tendinitis in achilles tendon.  Doing ultrasound tx. Limits exercise.  Wearing a boot.   Will see PCP in a couple weeks.  Work has been busy.   PCP:  Roe Coombs, PA-C  Patient's last menstrual period was 01/04/2012.           Sexually active: No.  The current method of family planning is post menopausal status.    Exercising: No.   Smoker:  Former  Health Maintenance: Pap:  04-24-15 Neg:Neg HR HPV History of abnormal Pap:  no MMG:  09-05-15 Density C/Neg/BiRads1:TBC.   Colonoscopy:   02/2006 normal with [facility].  Next due 06/2016. BMD:  09-05-15 Result:Osteopenia of hip:TBC   TDaP:  08/2008 Gardasil:   N/A Hep C:  Will d/w PCP.  Screening Labs:  Hb today: PCP, Urine today: PCP   reports that she quit smoking about 30 years ago. Her smoking use included Cigarettes. She quit after 15.00 years of use. She has never used smokeless tobacco. She reports that she drinks about 3.0 oz of alcohol per week . She reports that she does not use drugs.  Past Medical History:  Diagnosis Date  . Anxiety   . Excessive daytime sleepiness   . Heart murmur   . History of kidney stones   . History of panic attacks   . OSA on CPAP   . Osteopenia, senile 2017   hip  . Rectal mass   . RLS (restless legs syndrome)     Past Surgical History:  Procedure Laterality Date  . CARDIOVASCULAR STRESS TEST  09-19-2009   normal perfusion study/  ef 80%  . COLONOSCOPY  03/09/2006  . D & C HYSTEROSCOPY W/ RESECTION MYOMA  01-28-2009  . D & C HYSTEROSCOPY/  RESECTION MYOMA AND ENDOMETRIAL POLYP  09-23-2009  . EUS N/A 06/01/2013   Procedure: LOWER ENDOSCOPIC ULTRASOUND (EUS);  Surgeon: Milus Banister, MD;  Location: [facility];  Service: Endoscopy;  Laterality: N/A;  . EUS N/A 12/06/2014   Procedure: LOWER ENDOSCOPIC ULTRASOUND (EUS);  Surgeon: Milus Banister, MD;  Location: [facility];  Service: Endoscopy;  Laterality: N/A;  . LEFT URETEROSCOPIC LASER LITHOTRIPSY STONE EXTRACTION/ STENT PLACEMENT  12-13-2001  . TONSILLECTOMY AND ADENOIDECTOMY  age 56  . TRANSANAL EXCISION OF RECTAL MASS N/A 04/11/2015   Procedure: TRANSANAL EXCISION OF DISTAL RECTAL MASS;  Surgeon: [name], MD;  Location: [facility];  Service: General;  Laterality: N/A;  . TRANSTHORACIC ECHOCARDIOGRAM  09-16-2009   mild LVH,  ef 60-65%,  grade I diastolic dysfunction/  trivial MR and TR    Current Outpatient Prescriptions  Medication Sig Dispense Refill  . hydrochlorothiazide (HYDRODIURIL) 25 MG tablet Take 25 mg by mouth every morning.  0  . modafinil (PROVIGIL) 200 MG tablet TAKE 1 TABLET BY MOUTH EVERY DAY 30 tablet 5  . rotigotine (NEUPRO) 6 MG/24HR Place 1 patch onto the skin daily. 30 patch 12  . sertraline (ZOLOFT) 100 MG tablet Take 200 mg by mouth every morning.      No current facility-administered medications for this visit.     Family History  Problem Relation Age of Onset  . Breast cancer Mother 52  . Pancreatic cancer  Mother 15    ROS:  Pertinent items are noted in HPI.  Otherwise, a comprehensive ROS was negative.  Exam:   BP 118/76 (BP Location: Right Arm, Patient Position: Sitting, Cuff Size: Large)   Pulse 80   Resp 17   Ht 5\' 5"  (1.651 m)   Wt 232 lb (105.2 kg)   LMP 01/04/2012   BMI 38.61 kg/m     General appearance: alert, cooperative and appears stated age Head: Normocephalic, without obvious abnormality, atraumatic Neck: no adenopathy, supple, symmetrical, trachea midline and thyroid normal to inspection and palpation Lungs: clear to auscultation bilaterally Breasts: normal appearance, no masses or tenderness, No nipple retraction or dimpling, No nipple discharge or  bleeding, No axillary or supraclavicular adenopathy Heart: regular rate and rhythm Abdomen: soft, non-tender; no masses, no organomegaly Extremities: extremities normal, atraumatic, no cyanosis or edema Skin: Skin color, texture, turgor normal. No rashes or lesions Lymph nodes: Cervical, supraclavicular, and axillary nodes normal. No abnormal inguinal nodes palpated Neurologic: Grossly normal  Pelvic: External genitalia:  no lesions              Urethra:  normal appearing urethra with no masses, tenderness or lesions              Bartholins and Skenes: normal                 Vagina: normal appearing vagina with normal color and discharge, no lesions              Cervix: no lesions              Pap taken: Yes.   Bimanual Exam:  Uterus:  normal size, contour, position, consistency, mobility, non-tender              Adnexa: no mass, fullness, tenderness              Rectal exam: Yes.  .  Confirms.              Anus:  normal sphincter tone, no lesions  Chaperone was present for exam.  Assessment:   Well woman visit with normal exam. Osteopenia.  Vaginal dryness.   Plan: Yearly mammogram recommended after age 35.  Recommended self breast exam.  Pap and HR HPV as above. Discussed Calcium, Vitamin D, regular exercise program including cardiovascular and weight bearing exercise. Labs with PCP.  BMD in 2 years.  Discussed water based lubricants and coconut oil for dryness.  Vaginal estrogen also an option. She will try lubricants first. Follow up annually and prn.      After visit summary provided.

## 2016-05-21 ENCOUNTER — Ambulatory Visit (INDEPENDENT_AMBULATORY_CARE_PROVIDER_SITE_OTHER): Payer: Medicare HMO | Admitting: Obstetrics and Gynecology

## 2016-05-21 ENCOUNTER — Encounter: Payer: Self-pay | Admitting: Obstetrics and Gynecology

## 2016-05-21 VITALS — BP 118/76 | HR 80 | Resp 17 | Ht 65.0 in | Wt 232.0 lb

## 2016-05-21 DIAGNOSIS — Z01419 Encounter for gynecological examination (general) (routine) without abnormal findings: Secondary | ICD-10-CM

## 2016-05-21 NOTE — Patient Instructions (Signed)

## 2016-05-22 LAB — IPS PAP SMEAR ONLY

## 2016-05-25 ENCOUNTER — Encounter: Payer: Medicare HMO | Admitting: Podiatry

## 2016-05-27 DIAGNOSIS — I10 Essential (primary) hypertension: Secondary | ICD-10-CM | POA: Diagnosis not present

## 2016-05-27 DIAGNOSIS — Z6838 Body mass index (BMI) 38.0-38.9, adult: Secondary | ICD-10-CM | POA: Diagnosis not present

## 2016-05-27 DIAGNOSIS — Z0000 Encounter for general adult medical examination without abnormal findings: Secondary | ICD-10-CM | POA: Diagnosis not present

## 2016-05-27 DIAGNOSIS — R69 Illness, unspecified: Secondary | ICD-10-CM | POA: Diagnosis not present

## 2016-06-09 DIAGNOSIS — J209 Acute bronchitis, unspecified: Secondary | ICD-10-CM | POA: Diagnosis not present

## 2016-06-12 NOTE — Progress Notes (Signed)
This encounter was created in error - please disregard.

## 2016-06-15 DIAGNOSIS — J209 Acute bronchitis, unspecified: Secondary | ICD-10-CM | POA: Diagnosis not present

## 2016-06-16 DIAGNOSIS — J209 Acute bronchitis, unspecified: Secondary | ICD-10-CM | POA: Diagnosis not present

## 2016-06-18 ENCOUNTER — Ambulatory Visit (INDEPENDENT_AMBULATORY_CARE_PROVIDER_SITE_OTHER): Payer: Medicare HMO | Admitting: Podiatry

## 2016-06-18 ENCOUNTER — Encounter: Payer: Self-pay | Admitting: Podiatry

## 2016-06-18 DIAGNOSIS — M7661 Achilles tendinitis, right leg: Secondary | ICD-10-CM

## 2016-06-18 DIAGNOSIS — M7662 Achilles tendinitis, left leg: Secondary | ICD-10-CM | POA: Diagnosis not present

## 2016-06-19 NOTE — Progress Notes (Signed)
Subjective:     Patient ID: Amy Burnett, female   DOB: 1951/02/28, 66 y.o.   MRN: [id number]  HPI patient presents stating that the heel is doing better but she still gets discomfort and is not wearing tighter shoe she   Review of Systems     Objective:   Physical Exam Neurovascular status intact with posterior heel pain that's improved right with minimal discomfort upon palpation at this time    Assessment:     Achilles tendinitis right improving    Plan:     Advised on continued physical therapy anti-inflammatory and wearing of silicone brace. Hopefully this will be the end of the problem but patient will be seen back if symptoms recur

## 2016-06-21 ENCOUNTER — Other Ambulatory Visit: Payer: Self-pay | Admitting: Neurology

## 2016-06-21 DIAGNOSIS — G2581 Restless legs syndrome: Secondary | ICD-10-CM

## 2016-07-13 DIAGNOSIS — I10 Essential (primary) hypertension: Secondary | ICD-10-CM | POA: Diagnosis not present

## 2016-07-13 DIAGNOSIS — Z23 Encounter for immunization: Secondary | ICD-10-CM | POA: Diagnosis not present

## 2016-07-13 DIAGNOSIS — R69 Illness, unspecified: Secondary | ICD-10-CM | POA: Diagnosis not present

## 2016-07-13 DIAGNOSIS — E782 Mixed hyperlipidemia: Secondary | ICD-10-CM | POA: Diagnosis not present

## 2016-07-13 DIAGNOSIS — Z1159 Encounter for screening for other viral diseases: Secondary | ICD-10-CM | POA: Diagnosis not present

## 2016-07-23 ENCOUNTER — Ambulatory Visit (INDEPENDENT_AMBULATORY_CARE_PROVIDER_SITE_OTHER): Payer: Medicare HMO | Admitting: Podiatry

## 2016-07-23 ENCOUNTER — Ambulatory Visit (INDEPENDENT_AMBULATORY_CARE_PROVIDER_SITE_OTHER): Payer: Medicare HMO

## 2016-07-23 ENCOUNTER — Encounter: Payer: Self-pay | Admitting: Podiatry

## 2016-07-23 ENCOUNTER — Telehealth: Payer: Self-pay | Admitting: *Deleted

## 2016-07-23 DIAGNOSIS — M7662 Achilles tendinitis, left leg: Secondary | ICD-10-CM

## 2016-07-23 DIAGNOSIS — M79671 Pain in right foot: Secondary | ICD-10-CM | POA: Diagnosis not present

## 2016-07-23 DIAGNOSIS — G5781 Other specified mononeuropathies of right lower limb: Secondary | ICD-10-CM | POA: Diagnosis not present

## 2016-07-23 NOTE — Telephone Encounter (Addendum)
-----   Message from [place] sent at 07/23/2016  3:15 PM EST ----- Regarding: MRI Orders are in! Thanks! Faxed orders to [facility] and gave orders to [place] for [employer]. 08/10/2016-Pt states she wants Dr. Milinda Pointer to interpret her MRI. 08/11/2016-I told pt in had viewed the MRI and recommended she stay in her boot and make an appt. Transferred pt to schedulers. 08/12/2016-I informed pt, Dr. Milinda Pointer had reviewed her MRI and it showed chronic achilles tendonitis and there was tearing of the peroneal tendon that required surgical intervention, and that she needed to remain in the boot. Pt asked if surgery was the only answer, I told her, Dr. Milinda Pointer would discuss other options if any, at her appt.

## 2016-07-26 NOTE — Progress Notes (Signed)
She presents today with a chief complaint of radiating pain up the center aspect of the back of her leg from the lateral ankle right. She states that is been her left ankle this usually has been painful for the past year or so and really just does not seem to be improving. She states that the ankle is still sore in the back and has had multiple therapies including shockwave.  Objective: Vital signs are stable she is alert and oriented 3. Pulses remain palpable bilateral. No edema cellulitis drainage or odor. She has tenderness on palpation sural nerve with radiating pain along the lateral aspect of the foot. This is much decreased from previous pain that she states she had. Left foot does demonstrate painful Achilles tendon left.  Assessment: Painful Achilles tendinitis chronic in nature all conservative therapies been exhausted left. Sural nerve neuritis right.  Plan: She's going for an MRI of the left ankle. Expected to reveal a partial tear of the Achilles tendon ends insertion site for surgical intervention.

## 2016-07-27 ENCOUNTER — Ambulatory Visit: Payer: Medicare HMO | Admitting: Podiatry

## 2016-07-30 ENCOUNTER — Ambulatory Visit: Payer: Medicare HMO | Admitting: Nurse Practitioner

## 2016-08-03 ENCOUNTER — Telehealth: Payer: Self-pay | Admitting: *Deleted

## 2016-08-03 ENCOUNTER — Other Ambulatory Visit: Payer: Self-pay | Admitting: Podiatry

## 2016-08-03 NOTE — Telephone Encounter (Signed)
"  I'm calling in regards to mutual patient Amy Burnett.  She's scheduled for an MRI of the left ankle without contrast on Friday, 08/07/2016.  She has [employer] and it needs authorization through [employer].  If you have any questions give me a call."

## 2016-08-06 DIAGNOSIS — E782 Mixed hyperlipidemia: Secondary | ICD-10-CM | POA: Diagnosis not present

## 2016-08-06 DIAGNOSIS — I10 Essential (primary) hypertension: Secondary | ICD-10-CM | POA: Diagnosis not present

## 2016-08-06 DIAGNOSIS — Z1159 Encounter for screening for other viral diseases: Secondary | ICD-10-CM | POA: Diagnosis not present

## 2016-08-07 ENCOUNTER — Ambulatory Visit
Admission: RE | Admit: 2016-08-07 | Discharge: 2016-08-07 | Disposition: A | Discharge status: Home / Self Care | Payer: Medicare HMO | Source: Ambulatory Visit | Attending: Podiatry | Admitting: Podiatry

## 2016-08-07 DIAGNOSIS — M25572 Pain in left ankle and joints of left foot: Secondary | ICD-10-CM | POA: Diagnosis not present

## 2016-08-07 DIAGNOSIS — M7662 Achilles tendinitis, left leg: Secondary | ICD-10-CM

## 2016-08-07 NOTE — Telephone Encounter (Signed)
MRI was authorized by [employer].

## 2016-08-11 ENCOUNTER — Telehealth: Payer: Self-pay | Admitting: Podiatry

## 2016-08-11 DIAGNOSIS — I10 Essential (primary) hypertension: Secondary | ICD-10-CM | POA: Diagnosis not present

## 2016-08-11 DIAGNOSIS — E782 Mixed hyperlipidemia: Secondary | ICD-10-CM | POA: Diagnosis not present

## 2016-08-11 DIAGNOSIS — G2581 Restless legs syndrome: Secondary | ICD-10-CM | POA: Diagnosis not present

## 2016-08-11 DIAGNOSIS — Z0000 Encounter for general adult medical examination without abnormal findings: Secondary | ICD-10-CM | POA: Diagnosis not present

## 2016-08-11 DIAGNOSIS — M25572 Pain in left ankle and joints of left foot: Secondary | ICD-10-CM | POA: Diagnosis not present

## 2016-08-11 DIAGNOSIS — G4700 Insomnia, unspecified: Secondary | ICD-10-CM | POA: Diagnosis not present

## 2016-08-11 DIAGNOSIS — G8929 Other chronic pain: Secondary | ICD-10-CM | POA: Diagnosis not present

## 2016-08-11 DIAGNOSIS — R69 Illness, unspecified: Secondary | ICD-10-CM | POA: Diagnosis not present

## 2016-08-11 DIAGNOSIS — G4730 Sleep apnea, unspecified: Secondary | ICD-10-CM | POA: Diagnosis not present

## 2016-08-11 NOTE — Telephone Encounter (Signed)
Pt called and would like you to call her to discuss her mri results. She said she can see the results and does not feel she needs to come in to discuss them with you. Pt was offered an appointment for 3.1.18 and just wants you to call her.

## 2016-08-12 NOTE — Telephone Encounter (Signed)
She just needs to know that she needs to come in for a surgical consult.

## 2016-08-12 NOTE — Telephone Encounter (Signed)
o

## 2016-08-13 ENCOUNTER — Ambulatory Visit (INDEPENDENT_AMBULATORY_CARE_PROVIDER_SITE_OTHER): Payer: Medicare HMO | Admitting: Podiatry

## 2016-08-13 ENCOUNTER — Encounter: Payer: Self-pay | Admitting: Podiatry

## 2016-08-13 DIAGNOSIS — M7662 Achilles tendinitis, left leg: Secondary | ICD-10-CM | POA: Diagnosis not present

## 2016-08-13 NOTE — Patient Instructions (Signed)

## 2016-08-15 NOTE — Progress Notes (Signed)
She presents today for a read of her MRI. She states that her foot has really not gotten any better. She states this has been over a year now she is ready to have something done for this.  Objective: I have reviewed her past medical history medications allergies surgeon social history. I reviewed the MRI report with her today which did describe Achilles tendinopathy with a tear of the peroneal brevis tendon and plantar fasciitis.  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 Plantar fasciitis.  Plan: We went over a consent form today line by line number by no full-time as question she saw fit regarding a gastroc recession Achilles and lysis and a retrocalcaneal spur resection with an endoscopic plantar fasciotomy and cast application. She understands this is amenable to it. We discussed all the possible postop complications which may include but are not limited to postop pain bleeding swelling infection recurrence and need for further surgery overcorrection or under correction loss of digit loss of limb loss of life. We discussed performing this in a day surgery Center and that she would be intubated and in a prone position. We also discussed all complications that are involved in this. I will follow-up with her in the near future.

## 2016-08-17 ENCOUNTER — Encounter: Payer: Self-pay | Admitting: Podiatry

## 2016-08-17 NOTE — Progress Notes (Signed)
Patient's medical records requested to be sent to Dr. Lennox Grumbles at [facility] has been put up at the front desk for the mail carrier to pick up to mail today Monday 17 August 2016 per patient's request.

## 2016-08-25 DIAGNOSIS — M7662 Achilles tendinitis, left leg: Secondary | ICD-10-CM | POA: Diagnosis not present

## 2016-09-02 ENCOUNTER — Ambulatory Visit (INDEPENDENT_AMBULATORY_CARE_PROVIDER_SITE_OTHER): Payer: Medicare HMO | Admitting: Nurse Practitioner

## 2016-09-02 ENCOUNTER — Encounter: Payer: Self-pay | Admitting: Nurse Practitioner

## 2016-09-02 VITALS — BP 135/88 | HR 89 | Ht 65.0 in | Wt 235.8 lb

## 2016-09-02 DIAGNOSIS — G2581 Restless legs syndrome: Secondary | ICD-10-CM

## 2016-09-02 DIAGNOSIS — G4710 Hypersomnia, unspecified: Secondary | ICD-10-CM | POA: Diagnosis not present

## 2016-09-02 DIAGNOSIS — G4733 Obstructive sleep apnea (adult) (pediatric): Secondary | ICD-10-CM | POA: Diagnosis not present

## 2016-09-02 DIAGNOSIS — Z9989 Dependence on other enabling machines and devices: Secondary | ICD-10-CM | POA: Diagnosis not present

## 2016-09-02 MED ORDER — MODAFINIL 200 MG PO TABS: ORAL_TABLET | ORAL | 5 refills | Status: DC

## 2016-09-02 NOTE — Progress Notes (Signed)
I agree with the assessment and plan as directed by NP .The patient is known to me .   Brizza Nathanson, MD  

## 2016-09-02 NOTE — Progress Notes (Signed)
[facility]  PATIENT: Amy Burnett DOB: Feb 14, 1951   REASON FOR VISIT: Follow-up for restless legs,  obstructive sleep apnea with CPAP HISTORY FROM: Patient    HISTORY OF PRESENT ILLNESS:Amy Burnett, 66 year old caucasian, right handed female patient of Dr. Veronia Beets returns for followup.   The patient developed restless legs unrelated to blood loss, pregnancy, or any known Absorption difficulties. During her pregnancies she did not experience restless legs but developed a carpal tunnel syndrome in one pregnancy and in another had toxemia or HELP syndrome.   She was treated with Requip since 2006 and had an iron infusion., first at a lower dose but since than slowly has advanced to 4 mg daily -she takes 1 tablet twice in daytime and 2 tablets at night.  She has never been on the extended release dose of Requip. Also on Prometrium progesterone supplement on a Vivelle-Dot and takes Xanax as needed on a when necessary basis.  She is on Zoloft 2 of  100 mg tablets every morning for OCD.  She was diagnosed with low Ferritin, and has not taken oral iron after a singe iron infusion. This took place 8 years ago.   Chest with Christmas 2015 the patient had an exacerbation of sleep problems and restless legs which brings her also to the practice today. Her overall sleep time overnight at the time was probably less than 6 hours. Maybe closer to 5, she has no nocturia breaks reported. She has trouble falling asleep due to the restless legs restless legs however these seem not to wake her during her sleep at night.  Sleep is not extremely fragmented right now. Daytime sleepiness however has been a problem as the intake of her Requip dose of 1 mg correlates with excessive sleepiness occurring about 30-45 minutes later. She is currently on Ropinirole 4 mg total dose.She says she was in 2013 diagnosed with sleep apnea and is using CPAP at 4 cm. She was evaluated at [facility], by Dr.  Moshe Salisbury.  She gets no regular exercise. She knows she needs to lose some weight.   Interval history from 10/24/2015,CD Mrs. Burnett is here today with her compliance report from her CPAP. She has not been able to use the CPAP machine over 4 hours nightly on average only 2 hours and 11 minutes. The machine is set at 8 cm water without EPR, the residual AHI is 2.0. She does not have significant air leaks. She reports that he on average anyway sleeps only 4-5 hours however this is not reflected in her compliance. She thinks that she inadvertently takes it off. However the compliance is poor. She is not afraid to use it she usually enjoys using it. Reflected is today of very high degree of daytime sleepiness was 21 out of 24 points. And the geriatric depression score was endorsed at 2 out of 15. The patch that she has used to controlled her restless legs continues to work well -she's not woken up by restless legs at night. UPDATE 08/15/2017[name], 66 year old female returns for follow-up. She has obstructive sleep apnea on CPAP. 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 CPAP compliance today usage 29 out of 30 days for 97% average usage 5 hours 51 minutes. Pressure set at 8 cm AHI 2.5. Minimal leak. ESS today 12. She is on Provigil with good response, she says it has really made a difference. HLA was positive. Explained to the patient the test actually means. She remains on Neupro for  restless legs. Reviewed how to correctly put the patch on. She returns for reevaluation UPDATE 03/21/2018[name], 66 year old female returns for follow-up with obstructive sleep apnea on CPAP. CPAP compliance today is 40% for greater than 4 hours 53% less than 4 hours. Average usage 3 hours 56 minutes set pressure 8 cm AHI of 4.1. She claims Provigil is not working but she does not take it every day. ESS score today 17 fatigue severity score 50. When she pulled her chip out today to bring her compliance she noted that it  was  not in her machine completely. 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 She also has a history of restless legs  the Neupro is beneficial and controls her symptoms. She returns for reevaluation.      REVIEW OF SYSTEMS: Full 14 system review of systems performed and notable only for those listed, all others are neg:  Constitutional: neg  Cardiovascular: neg Ear/Nose/Throat: neg  Skin: neg Eyes: neg Respiratory: neg Gastroitestinal: neg  Hematology/Lymphatic: neg  Endocrine: neg Musculoskeletal Achilles tendinitis Allergy/Immunology: neg Neurological: neg Psychiatric: Anxiety  Sleep : Restless legs obstructive sleep apnea with CPAP, daytime sleepiness   ALLERGIES: No Known Allergies  HOME MEDICATIONS: Outpatient Medications Prior to Visit  Medication Sig Dispense Refill  . hydrochlorothiazide (HYDRODIURIL) 25 MG tablet Take 25 mg by mouth every morning.  0  . modafinil (PROVIGIL) 200 MG tablet TAKE 1 TABLET BY MOUTH EVERY DAY 30 tablet 5  . NEUPRO 6 MG/24HR PLACE 1 PATCH ONTO THE SKIN DAILY. 30 patch 11  . sertraline (ZOLOFT) 100 MG tablet Take 200 mg by mouth every morning.      No facility-administered medications prior to visit.     PAST MEDICAL HISTORY: Past Medical History:  Diagnosis Date  . Anxiety   . Excessive daytime sleepiness   . Heart murmur   . History of kidney stones   . History of panic attacks   . OSA on CPAP   . Osteopenia, senile 2017   hip  . Rectal mass   . RLS (restless legs syndrome)     PAST SURGICAL HISTORY: Past Surgical History:  Procedure Laterality Date  . CARDIOVASCULAR STRESS TEST  09-19-2009   normal perfusion study/  ef 80%  . COLONOSCOPY  03/09/2006  . D & C HYSTEROSCOPY W/ RESECTION MYOMA  01-28-2009  . D & C HYSTEROSCOPY/  RESECTION MYOMA AND ENDOMETRIAL POLYP  09-23-2009  . EUS N/A 06/01/2013   Procedure: LOWER ENDOSCOPIC ULTRASOUND (EUS);  Surgeon: Milus Banister, MD;  Location: [facility];  Service:  Endoscopy;  Laterality: N/A;  . EUS N/A 12/06/2014   Procedure: LOWER ENDOSCOPIC ULTRASOUND (EUS);  Surgeon: Milus Banister, MD;  Location: [facility];  Service: Endoscopy;  Laterality: N/A;  . LEFT URETEROSCOPIC LASER LITHOTRIPSY STONE EXTRACTION/ STENT PLACEMENT  12-13-2001  . TONSILLECTOMY AND ADENOIDECTOMY  age 81  . TRANSANAL EXCISION OF RECTAL MASS N/A 04/11/2015   Procedure: TRANSANAL EXCISION OF DISTAL RECTAL MASS;  Surgeon: [name], MD;  Location: [facility];  Service: General;  Laterality: N/A;  . TRANSTHORACIC ECHOCARDIOGRAM  09-16-2009   mild LVH,  ef 60-65%,  grade I diastolic dysfunction/  trivial MR and TR    FAMILY HISTORY: Family History  Problem Relation Age of Onset  . Breast cancer Mother 49  . Pancreatic cancer Mother 31    SOCIAL HISTORY: Social History   Social History  . Marital status: Married    Spouse name: [name]  .  Number of children: 2  . Years of education: college   Occupational History  .  Self-Employed    full time works for her self   Social History Main Topics  . Smoking status: Former Smoker    Years: 15.00    Types: Cigarettes    Quit date: 06/15/1985  . Smokeless tobacco: Never Used  . Alcohol use 3.0 oz/week    5 Glasses of wine per week     Comment: with dinner  . Drug use: No  . Sexual activity: No   Other Topics Concern  . Not on file   Social History Narrative   Patient lives at home with her husband [name])    Patient works full time   Education- college   Right handed   Caffeine- diet coke three daily     PHYSICAL EXAM  Vitals:   09/02/16 1030  Weight: 235 lb 12.8 oz (107 kg)  Height: '5\' 5"'$  (1.651 m)   Body mass index is 39.24 kg/m.  Generalized: Well developed, in no acute distress  Head: normocephalic and atraumatic,. Oropharynx benign ,  Neck: Supple, no carotid bruits .   Cardiac: Regular rate rhythm, no murmur  Musculoskeletal: No deformity   Neurological examination    Mentation: Alert oriented to time, place, history taking. Attention span and concentration appropriate. Recent and remote memory intact.  Follows all commands speech and language fluent. ESS 17. FSS 50  Cranial nerve II-XII: .Pupils were equal round reactive to light extraocular movements were full, visual field were full on confrontational test. Facial sensation and strength were normal. hearing was intact to finger rubbing bilaterally. Uvula tongue midline. head turning and shoulder shrug were normal and symmetric.Tongue protrusion into cheek strength was normal. Motor: normal bulk and tone, full strength in the BUE, BLE, fine finger movements normal, no pronator drift. No focal weakness Coordination: finger-nose-finger, heel-to-shin bilaterally, no dysmetria Reflexes  symmetric upper and lower ,plantar responses were flexor bilaterally. Gait and Station: Rising up from seated position without assistance, normal stance,  moderate stride, good arm swing, smooth turning, able to perform tiptoe, and heel walking without difficulty. Tandem gait is steady  DIAGNOSTIC DATA (LABS, IMAGING, TESTING) -  ASSESSMENT AND PLAN  66 y.o. year old female  has a past medical history of Anxiety;  History of panic attacks; OSA on CPAP;   and RLS (restless legs syndrome). here to follow-up. She also has hypersomnia.  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 Brett Fairy for compliance check I explained in particular the risks and ramifications of untreated moderate to severe OSA, especially with respect to cardiovascular disease  including congestive heart failure, difficult to treat hypertension, cardiac arrhythmias, or stroke.  Symptoms of untreated OSA  include daytime sleepiness, memory problems, mood irritability and mood disorder such as depression and anxiety, lack of energy, as well as recurrent headaches. We talked about trying to maintain a healthy lifestyle in general, as well as the importance of weight control. I encouraged the patient to eat healthy, exercise daily and keep well hydrated, to keep a scheduled bedtime and wake time routine, to not skip any meals and eat healthy snacks in between meals  I spent 25 min  in total face to face time with the patient more than 50% of which was spent counseling and coordination of care, reviewing test results reviewing medications and discussing and reviewing the diagnosis of OSA and importance of compliance. Her complaints are related to untreated obstructive sleep apnea. Dennie Bible, [facility], [facility], APRN  [facility] [address], [place], [place] [address] [phone]

## 2016-09-02 NOTE — Patient Instructions (Signed)
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 Brett Fairy for compliance check

## 2016-09-02 NOTE — Progress Notes (Signed)
Fax confirmation received for provigil [facility] [phone].

## 2016-09-23 DIAGNOSIS — J069 Acute upper respiratory infection, unspecified: Secondary | ICD-10-CM | POA: Diagnosis not present

## 2016-09-23 DIAGNOSIS — J00 Acute nasopharyngitis [common cold]: Secondary | ICD-10-CM | POA: Diagnosis not present

## 2016-10-12 ENCOUNTER — Encounter: Payer: Self-pay | Admitting: Internal Medicine

## 2016-10-13 ENCOUNTER — Other Ambulatory Visit: Payer: Self-pay | Admitting: Obstetrics and Gynecology

## 2016-10-13 DIAGNOSIS — Z1231 Encounter for screening mammogram for malignant neoplasm of breast: Secondary | ICD-10-CM

## 2016-10-19 DIAGNOSIS — G2581 Restless legs syndrome: Secondary | ICD-10-CM | POA: Diagnosis not present

## 2016-10-19 DIAGNOSIS — I10 Essential (primary) hypertension: Secondary | ICD-10-CM | POA: Diagnosis not present

## 2016-10-19 DIAGNOSIS — G4700 Insomnia, unspecified: Secondary | ICD-10-CM | POA: Diagnosis not present

## 2016-10-19 DIAGNOSIS — G4733 Obstructive sleep apnea (adult) (pediatric): Secondary | ICD-10-CM | POA: Diagnosis not present

## 2016-11-04 ENCOUNTER — Ambulatory Visit
Admission: RE | Admit: 2016-11-04 | Discharge: 2016-11-04 | Disposition: A | Discharge status: Home / Self Care | Payer: Medicare HMO | Source: Ambulatory Visit | Attending: Obstetrics and Gynecology | Admitting: Obstetrics and Gynecology

## 2016-11-04 DIAGNOSIS — Z1231 Encounter for screening mammogram for malignant neoplasm of breast: Secondary | ICD-10-CM | POA: Diagnosis not present

## 2016-11-16 DIAGNOSIS — M25572 Pain in left ankle and joints of left foot: Secondary | ICD-10-CM | POA: Diagnosis not present

## 2016-11-16 DIAGNOSIS — M7989 Other specified soft tissue disorders: Secondary | ICD-10-CM | POA: Diagnosis not present

## 2016-11-16 DIAGNOSIS — M7732 Calcaneal spur, left foot: Secondary | ICD-10-CM | POA: Diagnosis not present

## 2016-11-23 DIAGNOSIS — I10 Essential (primary) hypertension: Secondary | ICD-10-CM | POA: Diagnosis not present

## 2016-11-23 DIAGNOSIS — R600 Localized edema: Secondary | ICD-10-CM | POA: Diagnosis not present

## 2016-11-30 DIAGNOSIS — R600 Localized edema: Secondary | ICD-10-CM | POA: Diagnosis not present

## 2016-11-30 DIAGNOSIS — I10 Essential (primary) hypertension: Secondary | ICD-10-CM | POA: Diagnosis not present

## 2016-12-01 DIAGNOSIS — M7662 Achilles tendinitis, left leg: Secondary | ICD-10-CM | POA: Diagnosis not present

## 2016-12-04 DIAGNOSIS — M7662 Achilles tendinitis, left leg: Secondary | ICD-10-CM | POA: Diagnosis not present

## 2016-12-08 ENCOUNTER — Ambulatory Visit: Payer: Medicare HMO | Admitting: Neurology

## 2016-12-10 ENCOUNTER — Ambulatory Visit (AMBULATORY_SURGERY_CENTER): Payer: Self-pay

## 2016-12-10 VITALS — Ht 66.0 in | Wt 257.6 lb

## 2016-12-10 DIAGNOSIS — Z86010 Personal history of colonic polyps: Secondary | ICD-10-CM

## 2016-12-10 MED ORDER — POLYETHYLENE GLYCOL 3350 17 GM/SCOOP PO POWD: 1.0000 | Freq: Every day | ORAL | 3 refills | Status: DC

## 2016-12-10 NOTE — Progress Notes (Signed)
Denies allergies to eggs or soy products. Denies complication of anesthesia or sedation. Denies use of weight loss medication. Denies use of O2.   Emmi instructions declined.  

## 2016-12-11 ENCOUNTER — Encounter: Payer: Self-pay | Admitting: Internal Medicine

## 2016-12-24 ENCOUNTER — Ambulatory Visit (AMBULATORY_SURGERY_CENTER): Payer: Medicare HMO | Admitting: Internal Medicine

## 2016-12-24 ENCOUNTER — Encounter: Payer: Self-pay | Admitting: Internal Medicine

## 2016-12-24 VITALS — BP 125/68 | HR 82 | Temp 98.0°F | Resp 20 | Ht 66.0 in | Wt 257.0 lb

## 2016-12-24 DIAGNOSIS — Z1212 Encounter for screening for malignant neoplasm of rectum: Secondary | ICD-10-CM

## 2016-12-24 DIAGNOSIS — D123 Benign neoplasm of transverse colon: Secondary | ICD-10-CM | POA: Diagnosis not present

## 2016-12-24 DIAGNOSIS — Z1211 Encounter for screening for malignant neoplasm of colon: Secondary | ICD-10-CM | POA: Diagnosis not present

## 2016-12-24 DIAGNOSIS — K6289 Other specified diseases of anus and rectum: Secondary | ICD-10-CM | POA: Diagnosis not present

## 2016-12-24 MED ORDER — SODIUM CHLORIDE 0.9 % IV SOLN: 500.0000 mL | INTRAVENOUS | Status: DC

## 2016-12-24 NOTE — Patient Instructions (Addendum)
I removed one small polyp that looks benign. I will let you know pathology results and when to have another routine colonoscopy by mail and/or My Chart.  You also have a condition called diverticulosis - common and not usually a problem. Please read the handout provided.  I appreciate the opportunity to care for you. Gatha Mayer, MD, FACG    YOU HAD AN ENDOSCOPIC PROCEDURE TODAY AT [facility]:   Refer to the procedure report that was given to you for any specific questions about what was found during the examination.  If the procedure report does not answer your questions, please call your gastroenterologist to clarify.  If you requested that your care partner not be given the details of your procedure findings, then the procedure report has been included in a sealed envelope for you to review at your convenience later.  YOU SHOULD EXPECT: Some feelings of bloating in the abdomen. Passage of more gas than usual.  Walking can help get rid of the air that was put into your GI tract during the procedure and reduce the bloating. If you had a lower endoscopy (such as a colonoscopy or flexible sigmoidoscopy) you may notice spotting of blood in your stool or on the toilet paper. If you underwent a bowel prep for your procedure, you may not have a normal bowel movement for a few days.  Please Note:  You might notice some irritation and congestion in your nose or some drainage.  This is from the oxygen used during your procedure.  There is no need for concern and it should clear up in a day or so.  SYMPTOMS TO REPORT IMMEDIATELY:   Following lower endoscopy (colonoscopy or flexible sigmoidoscopy):  Excessive amounts of blood in the stool  Significant tenderness or worsening of abdominal pains  Swelling of the abdomen that is new, acute  Fever of 100F or higher    For urgent or emergent issues, a gastroenterologist can be reached at any hour by calling [phone]  [phone].   DIET:  We do recommend a small meal at first, but then you may proceed to your regular diet.  Drink plenty of fluids but you should avoid alcoholic beverages for 24 hours.  ACTIVITY:  You should plan to take it easy for the rest of today and you should NOT DRIVE or use heavy machinery until tomorrow (because of the sedation medicines used during the test).    FOLLOW UP: Our staff will call the number listed on your records the next business day following your procedure to check on you and address any questions or concerns that you may have regarding the information given to you following your procedure. If we do not reach you, we will leave a message.  However, if you are feeling well and you are not experiencing any problems, there is no need to return our call.  We will assume that you have returned to your regular daily activities without incident.  If any biopsies were taken you will be contacted by phone or by letter within the next 1-3 weeks.  Please call us at [phone] if you have not heard about the biopsies in 3 weeks.    SIGNATURES/CONFIDENTIALITY: You and/or your care partner have signed paperwork which will be entered into your electronic medical record.  These signatures attest to the fact that that the information above on your After Visit Summary has been reviewed and is understood.  Full responsibility of the confidentiality  of this discharge information lies with you and/or your care-partner.   Resume medications.Information given on polyps and diverticulosis.

## 2016-12-24 NOTE — Progress Notes (Signed)
Called to room to assist during endoscopic procedure.  Patient ID and intended procedure confirmed with present staff. Received instructions for my participation in the procedure from the performing physician.  

## 2016-12-24 NOTE — Op Note (Signed)
[facility] Patient Name: Amy Burnett Procedure Date: 12/24/2016 9:59 AM MRN: [id number] Endoscopist: Gatha Mayer , MD Age: 66 Referring MD:  Date of Birth: [birth year] Gender: Female Account #: [id number] Procedure:                Colonoscopy Indications:              Screening for colorectal malignant neoplasm, Last                            colonoscopy: 2007 Medicines:                Propofol per Anesthesia, Monitored Anesthesia Care Procedure:                Pre-Anesthesia Assessment:                           - Prior to the procedure, a History and Physical                            was performed, and patient medications and                            allergies were reviewed. The patient's tolerance of                            previous anesthesia was also reviewed. The risks                            and benefits of the procedure and the sedation                            options and risks were discussed with the patient.                            All questions were answered, and informed consent                            was obtained. Prior Anticoagulants: The patient has                            taken no previous anticoagulant or antiplatelet                            agents. ASA Grade Assessment: III - A patient with                            severe systemic disease. After reviewing the risks                            and benefits, the patient was deemed in                            satisfactory condition to undergo the procedure.  After obtaining informed consent, the colonoscope                            was passed under direct vision. Throughout the                            procedure, the patient's blood pressure, pulse, and                            oxygen saturations were monitored continuously. The                            Colonoscope was introduced through the anus and                            advanced to the  the cecum, identified by                            appendiceal orifice and ileocecal valve. The                            colonoscopy was performed without difficulty. The                            patient tolerated the procedure well. The quality                            of the bowel preparation was good. The bowel                            preparation used was Miralax. The ileocecal valve,                            appendiceal orifice, and rectum were photographed. Scope In: 10:13:50 AM Scope Out: 10:28:50 AM Scope Withdrawal Time: 0 hours 12 minutes 46 seconds  Total Procedure Duration: 0 hours 15 minutes 0 seconds  Findings:                 The perianal and digital rectal examinations were                            normal.                           A 5 mm polyp was found in the distal transverse                            colon. The polyp was sessile. The polyp was removed                            with a cold snare. Resection and retrieval were                            complete. Verification of patient identification  for the specimen was done. Estimated blood loss was                            minimal.                           Scattered small and large-mouthed diverticula were                            found in the sigmoid colon. There was no evidence                            of diverticular bleeding.                           The exam was otherwise without abnormality on                            direct and retroflexion views. Complications:            No immediate complications. Estimated Blood Loss:     Estimated blood loss was minimal. Impression:               - One 5 mm polyp in the distal transverse colon,                            removed with a cold snare. Resected and retrieved.                           - Mild diverticulosis in the sigmoid colon. There                            was no evidence of diverticular bleeding.                            - The examination was otherwise normal on direct                            and retroflexion views. Recommendation:           - Patient has a contact number available for                            emergencies. The signs and symptoms of potential                            delayed complications were discussed with the                            patient. Return to normal activities tomorrow.                            Written discharge instructions were provided to the                            patient.                           -  Resume previous diet.                           - Continue present medications.                           - Repeat colonoscopy is recommended. The                            colonoscopy date will be determined after pathology                            results from today's exam become available for                            review.                           - Patient has a contact number available for                            emergencies. The signs and symptoms of potential                            delayed complications were discussed with the                            patient. Return to normal activities tomorrow.                            Written discharge instructions were provided to the                            patient. Gatha Mayer, MD 12/24/2016 10:34:50 AM This report has been signed electronically.

## 2016-12-24 NOTE — Progress Notes (Signed)
Alert and oriented x3, pleased with MAC, report to RN [facility]

## 2016-12-25 ENCOUNTER — Telehealth: Payer: Self-pay | Admitting: *Deleted

## 2016-12-25 NOTE — Telephone Encounter (Signed)
  Follow up Call-  Call back number 12/24/2016  Post procedure Call Back phone  # [phone]  Permission to leave phone message Yes  Some recent data might be hidden     Patient questions:  Do you have a fever, pain , or abdominal swelling? No. Pain Score  0 *  Have you tolerated food without any problems? Yes.    Have you been able to return to your normal activities? Yes.    Do you have any questions about your discharge instructions: Diet   No. Medications  No. Follow up visit  No.  Do you have questions or concerns about your Care? No.  Actions: * If pain score is 4 or above: No action needed, pain <4.

## 2017-01-03 ENCOUNTER — Encounter: Payer: Self-pay | Admitting: Internal Medicine

## 2017-01-03 DIAGNOSIS — Z86010 Personal history of colonic polyps: Secondary | ICD-10-CM

## 2017-01-03 DIAGNOSIS — Z860101 Personal history of adenomatous and serrated colon polyps: Secondary | ICD-10-CM

## 2017-01-03 HISTORY — DX: Personal history of adenomatous and serrated colon polyps: Z86.0101

## 2017-01-03 HISTORY — DX: Personal history of colonic polyps: Z86.010

## 2017-01-03 NOTE — Progress Notes (Signed)
Diminutive adenoma Recall 2023 My Chart letter

## 2017-02-22 DIAGNOSIS — R69 Illness, unspecified: Secondary | ICD-10-CM | POA: Diagnosis not present

## 2017-03-12 DIAGNOSIS — M7662 Achilles tendinitis, left leg: Secondary | ICD-10-CM | POA: Diagnosis not present

## 2017-03-15 ENCOUNTER — Ambulatory Visit (INDEPENDENT_AMBULATORY_CARE_PROVIDER_SITE_OTHER): Payer: Medicare HMO | Admitting: Neurology

## 2017-03-15 ENCOUNTER — Encounter: Payer: Self-pay | Admitting: Neurology

## 2017-03-15 VITALS — BP 140/79 | HR 104 | Ht 66.0 in | Wt 244.0 lb

## 2017-03-15 DIAGNOSIS — G4761 Periodic limb movement disorder: Secondary | ICD-10-CM | POA: Diagnosis not present

## 2017-03-15 DIAGNOSIS — D509 Iron deficiency anemia, unspecified: Secondary | ICD-10-CM | POA: Diagnosis not present

## 2017-03-15 DIAGNOSIS — G4701 Insomnia due to medical condition: Secondary | ICD-10-CM

## 2017-03-15 DIAGNOSIS — G2581 Restless legs syndrome: Secondary | ICD-10-CM

## 2017-03-15 MED ORDER — MODAFINIL 200 MG PO TABS: ORAL_TABLET | ORAL | 5 refills | Status: DC

## 2017-03-15 MED ORDER — QUETIAPINE FUMARATE 25 MG PO TABS: 25.0000 mg | ORAL_TABLET | Freq: Every day | ORAL | 5 refills | Status: DC

## 2017-03-15 MED ORDER — ROTIGOTINE 6 MG/24HR TD PT24: 1.0000 | MEDICATED_PATCH | Freq: Every day | TRANSDERMAL | 11 refills | Status: DC

## 2017-03-15 NOTE — Progress Notes (Signed)
[facility]  PATIENT: Amy Burnett DOB: 20-Oct-1950   REASON FOR VISIT: Follow-up for restless legs,  obstructive sleep apnea with CPAP HISTORY FROM: Patient   I had the pleasure of seeing Amy Burnett today on 03/15/2017, a patient I have followed for over 3 years with history of restless legs and obstructive sleep apnea treated with CPAP. The patient has used CPAP 28 out of 30 days, but she is not able to sleep longer than 3 hours on block. Often she has to get up then attributing this to restless legs and periodic limb movement. She is often sleep deprived, would like to sleep longer but feels. There have been some nights with high air leaks but mostly 4/80% of the night her air leak is low. Pressure is ideal at 8 cm water with a residual of 3.2 she may even tolerate a little higher pressure. Fatigue severity is endorsed at 40 points and Epworth sleepiness score is endorsed at 16 out of 24 points. I feel he needs not to concentrate as much on for CPAP compliance but on her sleep duration. I would like to try a medication to help her sleep longer and thus improve her compliance. This medication should not make restless legs worse.    HISTORY OF PRESENT ILLNESS:Amy Burnett, 66 year old caucasian, right handed female patient of Dr. Veronia Beets returns for followup.   The patient developed restless legs unrelated to blood loss, pregnancy, or any known Absorption difficulties. During her pregnancies she did not experience restless legs but developed a carpal tunnel syndrome in one pregnancy and in another had toxemia or HELP syndrome.   She was treated with Requip since 2006 and had an iron infusion., first at a lower dose but since than slowly has advanced to 4 mg daily -she takes 1 tablet twice in daytime and 2 tablets at night.  She has never been on the extended release dose of Requip. Also on Prometrium progesterone supplement on a Vivelle-Dot and takes Xanax as needed on  a when necessary basis.  She is on Zoloft 2 of  100 mg tablets every morning for OCD.  She was diagnosed with low Ferritin, and has not taken oral iron after a singe iron infusion. This took place 8 years ago.   Chest with Christmas 2015 the patient had an exacerbation of sleep problems and restless legs which brings her also to the practice today. Her overall sleep time overnight at the time was probably less than 6 hours. Maybe closer to 5, she has no nocturia breaks reported. She has trouble falling asleep due to the restless legs restless legs however these seem not to wake her during her sleep at night.  Sleep is not extremely fragmented right now. Daytime sleepiness however has been a problem as the intake of her Requip dose of 1 mg correlates with excessive sleepiness occurring about 30-45 minutes later. She is currently on Ropinirole 4 mg total dose.She says she was in 2013 diagnosed with sleep apnea and is using CPAP at 4 cm. She was evaluated at [facility], by Dr. Moshe Salisbury.  She gets no regular exercise. She knows she needs to lose some weight.   Interval history from 10/24/2015,Amy Burnett is here today with her compliance report from her CPAP. She has not been able to use the CPAP machine over 4 hours nightly on average only 2 hours and 11 minutes. The machine is set at 8 cm water without EPR, the residual AHI  is 2.0. She does not have significant air leaks. She reports that he on average anyway sleeps only 4-5 hours however this is not reflected in her compliance. She thinks that she inadvertently takes it off. However the compliance is poor. She is not afraid to use it she usually enjoys using it. Reflected is today of very high degree of daytime sleepiness was 21 out of 24 points. And the geriatric depression score was endorsed at 2 out of 15. The patch that she has used to controlled her restless legs continues to work well -she's not woken up by restless legs at night. UPDATE  08/15/2017[name], 66 year old female returns for follow-up. She has obstructive sleep apnea on CPAP. 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 CPAP compliance today usage 29 out of 30 days for 97% average usage 5 hours 51 minutes. Pressure set at 8 cm AHI 2.5. Minimal leak. ESS today 12. She is on Provigil with good response, she says it has really made a difference. HLA was positive. Explained to the patient the test actually means. She remains on Neupro for restless legs. Reviewed how to correctly put the patch on. She returns for reevaluation  UPDATE 03/21/2018[name], 66 year old female returns for follow-up with obstructive sleep apnea on CPAP. CPAP compliance today is 40% for greater than 4 hours 53% less than 4 hours. Average usage 3 hours 56 minutes set pressure 8 cm AHI of 4.1. She claims Provigil is not working but she does not take it every day. ESS score today 17 fatigue severity score 50. When she pulled her chip out today to bring her compliance she noted that it was  not in her machine completely. 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 She also has a history of restless legs  the Neupro is beneficial and controls her symptoms. She returns for reevaluation.      REVIEW OF SYSTEMS: Full 14 system review of systems performed and notable only for those listed, all others are neg:  Psychiatric: Anxiety ,RLS -  Anemia, OSA , sleep deprivation.  Sleep : Restless legs obstructive sleep apnea with CPAP, daytime sleepiness   ALLERGIES: No Known Allergies  HOME MEDICATIONS: Outpatient Medications Prior to Visit  Medication Sig Dispense Refill  . ALPRAZolam (XANAX) 0.5 MG tablet Take 0.5 mg by mouth at bedtime as needed.     . hydrochlorothiazide (HYDRODIURIL) 25 MG tablet Take 25 mg by mouth every morning.  0  . modafinil (PROVIGIL) 200 MG tablet 1 at 7am and second dose at 12 noon, or no later than 2pm 60 tablet 5  . NEUPRO 6 MG/24HR PLACE 1 PATCH ONTO THE SKIN DAILY. 30  patch 11  . sertraline (ZOLOFT) 100 MG tablet Take 200 mg by mouth every morning.     Marland Kitchen albuterol (PROVENTIL HFA;VENTOLIN HFA) 108 (90 Base) MCG/ACT inhaler Inhale into the lungs.    Marland Kitchen 0.9 %  sodium chloride infusion      No facility-administered medications prior to visit.     PAST MEDICAL HISTORY: Past Medical History:  Diagnosis Date  . Anxiety   . Excessive daytime sleepiness   . Heart murmur   . History of kidney stones   . History of panic attacks   . Hx of adenomatous polyp of colon 01/03/2017  . Hypertension   . OSA on CPAP   . Osteopenia, senile 2017   hip  . Rectal lipoma 2016  . RLS (restless legs syndrome)   . Sleep apnea  PAST SURGICAL HISTORY: Past Surgical History:  Procedure Laterality Date  . Achilles Tendinitis    . CARDIOVASCULAR STRESS TEST  09-19-2009   normal perfusion study/  ef 80%  . COLONOSCOPY  03/09/2006  . D & C HYSTEROSCOPY W/ RESECTION MYOMA  01-28-2009  . D & C HYSTEROSCOPY/  RESECTION MYOMA AND ENDOMETRIAL POLYP  09-23-2009  . EUS N/A 06/01/2013   Procedure: LOWER ENDOSCOPIC ULTRASOUND (EUS);  Surgeon: Milus Banister, MD;  Location: [facility];  Service: Endoscopy;  Laterality: N/A;  . EUS N/A 12/06/2014   Procedure: LOWER ENDOSCOPIC ULTRASOUND (EUS);  Surgeon: Milus Banister, MD;  Location: [facility];  Service: Endoscopy;  Laterality: N/A;  . LEFT URETEROSCOPIC LASER LITHOTRIPSY STONE EXTRACTION/ STENT PLACEMENT  12-13-2001  . TONSILLECTOMY AND ADENOIDECTOMY  age 13  . TRANSANAL EXCISION OF RECTAL MASS N/A 04/11/2015   Procedure: TRANSANAL EXCISION OF DISTAL RECTAL MASS;  Surgeon: [name], MD;  Location: [facility];  Service: General;  Laterality: N/A;  . TRANSTHORACIC ECHOCARDIOGRAM  09-16-2009   mild LVH,  ef 60-65%,  grade I diastolic dysfunction/  trivial MR and TR    FAMILY HISTORY: Family History  Problem Relation Age of Onset  . Breast cancer Mother 62  . Pancreatic cancer Mother 26  . Colon  cancer Neg Hx   . Esophageal cancer Neg Hx   . Rectal cancer Neg Hx   . Stomach cancer Neg Hx     SOCIAL HISTORY: Social History   Social History  . Marital status: Married    Spouse name: [name]  . Number of children: 2  . Years of education: college   Occupational History  .  Self-Employed    full time works for her self   Social History Main Topics  . Smoking status: Former Smoker    Years: 15.00    Types: Cigarettes    Quit date: 06/15/1985  . Smokeless tobacco: Never Used     Comment: Quit 32 years ago.  . Alcohol use 3.0 oz/week    5 Glasses of wine per week     Comment: with dinner  . Drug use: No  . Sexual activity: No   Other Topics Concern  . Not on file   Social History Narrative   Patient lives at home with her husband [name])    Patient works full time   Education- college   Right handed   Caffeine- diet coke three daily     PHYSICAL EXAM  Vitals:   03/15/17 1521  BP: 140/79  Pulse: (!) 104  Weight: 244 lb (110.7 kg)  Height: 5' 6"  (1.676 m)   Body mass index is 39.38 kg/m.    Generalized: Well developed, in no acute distress , but restless.  Head: normocephalic and atraumatic,. Oropharynx benign ,  Neck: Supple, no carotid bruits.   Cardiac: Regular rate rhythm, no murmur  Musculoskeletal: No deformity   Neurological examination   Mentation: Alert oriented to time, place, history taking. Attention span and concentration appropriate. Recent and remote memory intact.  Follows all commands speech and language fluent.  Cranial nerve: no changes in taste or smell. Pupils were equal round reactive to light extraocular movements were full, visual field were full on confrontational test. Facial sensation and strength were normal. hearing was intact to finger rubbing bilaterally. Uvula tongue midline. head turning and shoulder shrug were normal and symmetric.Tongue protrusion into cheek strength was normal. Motor: normal bulk and tone,  fine finger  movements  normal, no pronator drift. No focal weakness Coordination: finger-nose-finger, heel-to-shin bilaterally, no dysmetria, no tremor.  Reflexes  symmetric upper and lower ,plantar responses were flexor bilaterally. Gait and Station: Rising up from seated position without assistance, normal stance and walking without difficulty.  DIAGNOSTIC DATA (LABS, IMAGING, TESTING -   ASSESSMENT AND PLAN  65 y.o. year old female  has a past medical history of Anxiety;  History of panic attacks; OSA on CPAP;   and RLS (restless legs syndrome). here to follow-up. 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 Continue CPAP at current settings discussed noncompliance and it must be at least 75 % of the time used - she reports she doesn't sleep nearly r this long ( 4 hours each night )  Continue Neupro at current dose for restless legs- will check iron , TIBC, Ferritin.  Sleep may improve on Seroquel- insomnia.  No back pain not history of spinal degeneration. Did not order any spinal images.  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  Follow-up in 3-6 month with NP   Larey Seat, MD  [facility] [address] [place], [place] [address] [phone]

## 2017-03-15 NOTE — Patient Instructions (Addendum)
Quetiapine tablets What is this medicine? QUETIAPINE (kwe TYE a peen) is an antipsychotic. It is used to treat schizophrenia and bipolar disorder, also known as manic-depression. This medicine may be used for other purposes; ask your health care provider or pharmacist if you have questions. COMMON BRAND NAME(S): Seroquel What should I tell my health care provider before I take this medicine? They need to know if you have any of these conditions: -brain tumor or head injury -breast cancer -cataracts -diabetes -difficulty swallowing -heart disease -kidney disease -liver disease -low blood counts, like low white cell, platelet, or red cell counts -low blood pressure or dizziness when standing up -Parkinson's disease -previous heart attack -seizures -suicidal thoughts, plans, or attempt by you or a family member -thyroid disease -an unusual or allergic reaction to quetiapine, other medicines, foods, dyes, or preservatives -pregnant or trying to get pregnant -breast-feeding How should I use this medicine? Take this medicine by mouth. Swallow it with a drink of water. Follow the directions on the prescription label. If it upsets your stomach you can take it with food. Take your medicine at regular intervals. Do not take it more often than directed. Do not stop taking except on the advice of your doctor or health care professional. A special MedGuide will be given to you by the pharmacist with each prescription and refill. Be sure to read this information carefully each time. Talk to your pediatrician regarding the use of this medicine in children. While this drug may be prescribed for children as young as 10 years for selected conditions, precautions do apply. Patients over age 50 years may have a stronger reaction to this medicine and need smaller doses. Overdosage: If you think you have taken too much of this medicine contact a poison control center or emergency room at once. NOTE: This  medicine is only for you. Do not share this medicine with others. What if I miss a dose? If you miss a dose, take it as soon as you can. If it is almost time for your next dose, take only that dose. Do not take double or extra doses. What may interact with this medicine? Do not take this medicine with any of the following medications: -certain medicines for fungal infections like fluconazole, itraconazole, ketoconazole, posaconazole, voriconazole -cisapride -dofetilide -dronedarone -droperidol -grepafloxacin -halofantrine -phenothiazines like chlorpromazine, mesoridazine, thioridazine -pimozide -sparfloxacin -ziprasidone This medicine may also interact with the following medications: -alcohol -antiviral medicines for HIV or AIDS -certain medicines for blood pressure -certain medicines for depression, anxiety, or psychotic disturbances like haloperidol, lorazepam -certain medicines for diabetes -certain medicines for Parkinson's disease -certain medicines for seizures like carbamazepine, phenobarbital, phenytoin -cimetidine -erythromycin -other medicines that prolong the QT interval (cause an abnormal heart rhythm) -rifampin -steroid medicines like prednisone or cortisone This list may not describe all possible interactions. Give your health care provider a list of all the medicines, herbs, non-prescription drugs, or dietary supplements you use. Also tell them if you smoke, drink alcohol, or use illegal drugs. Some items may interact with your medicine. What should I watch for while using this medicine? Visit your doctor or health care professional for regular checks on your progress. It may be several weeks before you see the full effects of this medicine. Your health care provider may suggest that you have your eyes examined prior to starting this medicine, and every 6 months thereafter. If you have been taking this medicine regularly for some time, do not suddenly stop taking it.  You must gradually  reduce the dose or your symptoms may get worse. Ask your doctor or health care professional for advice. Patients and their families should watch out for worsening depression or thoughts of suicide. Also watch out for sudden or severe changes in feelings such as feeling anxious, agitated, panicky, irritable, hostile, aggressive, impulsive, severely restless, overly excited and hyperactive, or not being able to sleep. If this happens, especially at the beginning of antidepressant treatment or after a change in dose, call your health care professional. Dennis Bast may get dizzy or drowsy. Do not drive, use machinery, or do anything that needs mental alertness until you know how this medicine affects you. Do not stand or sit up quickly, especially if you are an older patient. This reduces the risk of dizzy or fainting spells. Alcohol can increase dizziness and drowsiness. Avoid alcoholic drinks. Do not treat yourself for colds, diarrhea or allergies. Ask your doctor or health care professional for advice, some ingredients may increase possible side effects. This medicine can reduce the response of your body to heat or cold. Dress warm in cold weather and stay hydrated in hot weather. If possible, avoid extreme temperatures like saunas, hot tubs, very hot or cold showers, or activities that can cause dehydration such as vigorous exercise. What side effects may I notice from receiving this medicine? Side effects that you should report to your doctor or health care professional as soon as possible: -allergic reactions like skin rash, itching or hives, swelling of the face, lips, or tongue -difficulty swallowing -fast or irregular heartbeat -fever or chills, sore throat -fever with rash, swollen lymph nodes, or swelling of the face -increased hunger or thirst -increased urination -problems with balance, talking, walking -seizures -stiff muscles -suicidal thoughts or other mood  changes -uncontrollable head, mouth, neck, arm, or leg movements -unusually weak or tired Side effects that usually do not require medical attention (report to your doctor or health care professional if they continue or are bothersome): -change in sex drive or performance -constipation -drowsy or dizzy -dry mouth -stomach upset -weight gain This list may not describe all possible side effects. Call your doctor for medical advice about side effects. You may report side effects to FDA at 1-800-FDA-1088. Where should I keep my medicine? Keep out of the reach of children. Store at room temperature between 15 and 30 degrees C (59 and 86 degrees F). Throw away any unused medicine after the expiration date. NOTE: This sheet is a summary. It may not cover all possible information. If you have questions about this medicine, talk to your doctor, pharmacist, or health care provider.  2018 Elsevier/Gold Standard (2014-12-04 13:07:35) Restless Legs Syndrome Restless legs syndrome is a condition that causes uncomfortable feelings or sensations in the legs, especially while sitting or lying down. The sensations usually cause an overwhelming urge to move the legs. The arms can also sometimes be affected. The condition can range from mild to severe. The symptoms often interfere with a person's ability to sleep. What are the causes? The cause of this condition is not known. What increases the risk? This condition is more likely to develop in:  People who are older than age 51.  Pregnant women. In general, restless legs syndrome is more common in women than in men.  People who have a family history of the condition.  People who have certain medical conditions, such as iron deficiency, kidney disease, Parkinson disease, or nerve damage.  People who take certain medicines, such as medicines for high blood pressure,  nausea, colds, allergies, depression, and some heart conditions.  What are the signs or  symptoms? The main symptom of this condition is uncomfortable sensations in the legs. These sensations may be:  Described as pulling, tingling, prickling, throbbing, crawling, or burning.  Worse while you are sitting or lying down.  Worse during periods of rest or inactivity.  Worse at night, often interfering with your sleep.  Accompanied by a very strong urge to move your legs.  Temporarily relieved by movement of your legs.  The sensations usually affect both sides of the body. The arms can also be affected, but this is rare. People who have this condition often have tiredness during the day because of their lack of sleep at night. How is this diagnosed? This condition may be diagnosed based on your description of the symptoms. You may also have tests, including blood tests, to check for other conditions that may lead to your symptoms. In some cases, you may be asked to spend some time in a sleep lab so your sleeping can be monitored. How is this treated? Treatment for this condition is focused on managing the symptoms. Treatment may include:  Self-help and lifestyle changes.  Medicines.  Follow these instructions at home:  Take medicines only as directed by your health care provider.  Try these methods to get temporary relief from the uncomfortable sensations: ? Massage your legs. ? Walk or stretch. ? Take a cold or hot bath.  Practice good sleep habits. For example, go to bed and get up at the same time every day.  Exercise regularly.  Practice ways of relaxing, such as yoga or meditation.  Avoid caffeine and alcohol.  Do not use any tobacco products, including cigarettes, chewing tobacco, or electronic cigarettes. If you need help quitting, ask your health care provider.  Keep all follow-up visits as directed by your health care provider. This is important. Contact a health care provider if: Your symptoms do not improve with treatment, or they get worse. This  information is not intended to replace advice given to you by your health care provider. Make sure you discuss any questions you have with your health care provider. Document Released: 05/22/2002 Document Revised: 11/07/2015 Document Reviewed: 05/28/2014 Elsevier Interactive Patient Education  [employer].

## 2017-04-05 DIAGNOSIS — I10 Essential (primary) hypertension: Secondary | ICD-10-CM | POA: Diagnosis not present

## 2017-04-05 DIAGNOSIS — R600 Localized edema: Secondary | ICD-10-CM | POA: Diagnosis not present

## 2017-04-05 DIAGNOSIS — Z23 Encounter for immunization: Secondary | ICD-10-CM | POA: Diagnosis not present

## 2017-04-11 ENCOUNTER — Other Ambulatory Visit: Payer: Self-pay | Admitting: Nurse Practitioner

## 2017-04-11 DIAGNOSIS — G4701 Insomnia due to medical condition: Secondary | ICD-10-CM

## 2017-04-11 DIAGNOSIS — D509 Iron deficiency anemia, unspecified: Secondary | ICD-10-CM

## 2017-04-11 DIAGNOSIS — G2581 Restless legs syndrome: Secondary | ICD-10-CM

## 2017-04-14 ENCOUNTER — Other Ambulatory Visit: Payer: Self-pay | Admitting: Nurse Practitioner

## 2017-04-14 DIAGNOSIS — G4701 Insomnia due to medical condition: Secondary | ICD-10-CM

## 2017-04-14 DIAGNOSIS — G2581 Restless legs syndrome: Secondary | ICD-10-CM

## 2017-04-14 DIAGNOSIS — D509 Iron deficiency anemia, unspecified: Secondary | ICD-10-CM

## 2017-05-03 ENCOUNTER — Other Ambulatory Visit: Payer: Self-pay | Admitting: Neurology

## 2017-05-03 DIAGNOSIS — G2581 Restless legs syndrome: Secondary | ICD-10-CM

## 2017-05-03 DIAGNOSIS — D509 Iron deficiency anemia, unspecified: Secondary | ICD-10-CM

## 2017-05-03 DIAGNOSIS — G4701 Insomnia due to medical condition: Secondary | ICD-10-CM

## 2017-05-03 MED ORDER — MODAFINIL 200 MG PO TABS: ORAL_TABLET | ORAL | 5 refills | Status: DC

## 2017-05-04 ENCOUNTER — Other Ambulatory Visit: Payer: Self-pay | Admitting: Neurology

## 2017-05-04 DIAGNOSIS — G4701 Insomnia due to medical condition: Secondary | ICD-10-CM

## 2017-05-04 MED ORDER — QUETIAPINE FUMARATE 25 MG PO TABS: 25.0000 mg | ORAL_TABLET | Freq: Every day | ORAL | 2 refills | Status: DC

## 2017-06-01 ENCOUNTER — Other Ambulatory Visit: Payer: Self-pay | Admitting: Neurology

## 2017-06-01 ENCOUNTER — Encounter: Payer: Self-pay | Admitting: Neurology

## 2017-06-01 DIAGNOSIS — G2581 Restless legs syndrome: Secondary | ICD-10-CM

## 2017-06-01 DIAGNOSIS — D509 Iron deficiency anemia, unspecified: Secondary | ICD-10-CM

## 2017-06-01 DIAGNOSIS — G4701 Insomnia due to medical condition: Secondary | ICD-10-CM

## 2017-06-01 DIAGNOSIS — G4761 Periodic limb movement disorder: Secondary | ICD-10-CM

## 2017-06-01 MED ORDER — ROTIGOTINE 6 MG/24HR TD PT24: 1.0000 | MEDICATED_PATCH | Freq: Every day | TRANSDERMAL | 2 refills | Status: DC

## 2017-06-03 ENCOUNTER — Ambulatory Visit: Payer: Medicare HMO | Admitting: Obstetrics and Gynecology

## 2017-06-30 DIAGNOSIS — M7662 Achilles tendinitis, left leg: Secondary | ICD-10-CM | POA: Diagnosis not present

## 2017-07-14 ENCOUNTER — Ambulatory Visit: Payer: Medicare HMO | Admitting: Obstetrics and Gynecology

## 2017-07-15 DIAGNOSIS — J40 Bronchitis, not specified as acute or chronic: Secondary | ICD-10-CM | POA: Diagnosis not present

## 2017-07-15 DIAGNOSIS — I10 Essential (primary) hypertension: Secondary | ICD-10-CM | POA: Diagnosis not present

## 2017-08-27 ENCOUNTER — Ambulatory Visit: Payer: Medicare HMO | Admitting: Obstetrics and Gynecology

## 2017-09-20 ENCOUNTER — Telehealth: Payer: Self-pay | Admitting: *Deleted

## 2017-09-20 NOTE — Telephone Encounter (Signed)
Initiated PA modafinil 200mg  tab on covermymeds. [employer] [place]. In process of completing.

## 2017-09-20 NOTE — Telephone Encounter (Signed)
Submitted PA. Waiting on determination. °

## 2017-09-21 NOTE — Telephone Encounter (Signed)
Received fax notification from [place] that [place] approved through [employer] part D. Effective 06/13/17-06/14/18. Referral number: [id number].  Any questions call: [phone].

## 2017-09-23 DIAGNOSIS — R69 Illness, unspecified: Secondary | ICD-10-CM | POA: Diagnosis not present

## 2017-10-18 NOTE — Progress Notes (Signed)
67 y.o. G43P2002 Married Caucasian female here for annual exam.    No vaginal bleeding.   Had biopsy of cervix 05-22-15 at 3:00 position and it showed benign chronic inflammation and underlying vascular congestion and hemorrhage.   Labs with PCP.   PCP: Anastasia Pall, MD/Sara Eulas Post, PA-C   Patient's last menstrual period was 01/04/2012.           Sexually active: No.  The current method of family planning is post menopausal status.     Exercising: Yes.    water aerobics.  Will see a trainer soon.  Smoker:  Former  Health Maintenance: Pap: 05-21-16 Neg, 04-24-15 Neg:Neg HR HPV History of abnormal Pap:  no MMG: 11-04-16 Density B/Neg/BiRads1.   Colonoscopy: 12-24-16 polyp;next due 2023 BMD: 09-05-15 Result :Osteopenia of Hip TDaP:  08/2008 Gardasil:   no HIV: Neg with PCP Hep C:Neg with PCP Screening Labs:  Hb today: PCP    reports that she quit smoking about 32 years ago. Her smoking use included cigarettes. She quit after 15.00 years of use. She has never used smokeless tobacco. She reports that she drinks about 3.6 - 4.8 oz of alcohol per week. She reports that she does not use drugs.  Past Medical History:  Diagnosis Date  . Anxiety   . Excessive daytime sleepiness   . Heart murmur   . History of kidney stones   . History of panic attacks   . Hx of adenomatous polyp of colon 01/03/2017  . Hypertension   . OSA on CPAP   . Osteopenia, senile 2017   hip  . Rectal lipoma 2016  . RLS (restless legs syndrome)   . Sleep apnea     Past Surgical History:  Procedure Laterality Date  . Achilles Tendinitis    . CARDIOVASCULAR STRESS TEST  09-19-2009   normal perfusion study/  ef 80%  . COLONOSCOPY  03/09/2006  . D & C HYSTEROSCOPY W/ RESECTION MYOMA  01-28-2009  . D & C HYSTEROSCOPY/  RESECTION MYOMA AND ENDOMETRIAL POLYP  09-23-2009  . EUS N/A 06/01/2013   Procedure: LOWER ENDOSCOPIC ULTRASOUND (EUS);  Surgeon: Milus Banister, MD;  Location: [facility];  Service:  Endoscopy;  Laterality: N/A;  . EUS N/A 12/06/2014   Procedure: LOWER ENDOSCOPIC ULTRASOUND (EUS);  Surgeon: Milus Banister, MD;  Location: [facility];  Service: Endoscopy;  Laterality: N/A;  . LEFT URETEROSCOPIC LASER LITHOTRIPSY STONE EXTRACTION/ STENT PLACEMENT  12-13-2001  . TONSILLECTOMY AND ADENOIDECTOMY  age 56  . TRANSANAL EXCISION OF RECTAL MASS N/A 04/11/2015   Procedure: TRANSANAL EXCISION OF DISTAL RECTAL MASS;  Surgeon: [name], MD;  Location: [facility];  Service: General;  Laterality: N/A;  . TRANSTHORACIC ECHOCARDIOGRAM  09-16-2009   mild LVH,  ef 60-65%,  grade I diastolic dysfunction/  trivial MR and TR    Current Outpatient Medications  Medication Sig Dispense Refill  . albuterol (PROAIR HFA) 108 (90 Base) MCG/ACT inhaler Inhale into the lungs.    . ALPRAZolam (XANAX) 0.5 MG tablet Take 0.5 mg by mouth at bedtime as needed.     . hydrochlorothiazide (HYDRODIURIL) 25 MG tablet Take 25 mg by mouth every morning.  0  . modafinil (PROVIGIL) 200 MG tablet 1 at 7am and second dose at 12 noon, or no later than 2pm 60 tablet 5  . QUEtiapine (SEROQUEL) 25 MG tablet Take 1 tablet (25 mg total) by mouth at bedtime. 90 tablet 2  . rotigotine (NEUPRO) 6 MG/24HR Place 1  patch onto the skin daily. 90 patch 2  . sertraline (ZOLOFT) 100 MG tablet Take 200 mg by mouth every morning.      No current facility-administered medications for this visit.     Family History  Problem Relation Age of Onset  . Breast cancer Mother 54  . Pancreatic cancer Mother 74  . Other Sister        pituitary adenoma  . Colon cancer Neg Hx   . Esophageal cancer Neg Hx   . Rectal cancer Neg Hx   . Stomach cancer Neg Hx     Review of Systems  Constitutional: Negative.   HENT: Negative.   Eyes: Negative.   Respiratory: Negative.   Cardiovascular: Negative.   Gastrointestinal: Negative.   Endocrine: Negative.   Genitourinary: Negative.   Musculoskeletal: Negative.   Skin:  Negative.   Allergic/Immunologic: Negative.   Neurological: Negative.   Hematological: Negative.   Psychiatric/Behavioral: Negative.     Exam:   BP 130/82 (BP Location: Right Arm, Patient Position: Sitting, Cuff Size: Large)   Pulse 80   Resp 14   Ht 5\' 5"  (1.651 m)   Wt 242 lb (109.8 kg)   LMP 01/04/2012   BMI 40.27 kg/m     General appearance: alert, cooperative and appears stated age Head: Normocephalic, without obvious abnormality, atraumatic Neck: no adenopathy, supple, symmetrical, trachea midline and thyroid normal to inspection and palpation Lungs: clear to auscultation bilaterally Breasts: normal appearance, no masses or tenderness, No nipple retraction or dimpling, No nipple discharge or bleeding, No axillary or supraclavicular adenopathy Heart: regular rate and rhythm Abdomen: soft, non-tender; no masses, no organomegaly Extremities: extremities normal, atraumatic, no cyanosis or edema Skin: Skin color, texture, turgor normal. No rashes or lesions Lymph nodes: Cervical, supraclavicular, and axillary nodes normal. No abnormal inguinal nodes palpated Neurologic: Grossly normal  Pelvic: External genitalia:  no lesions              Urethra:  normal appearing urethra with no masses, tenderness or lesions              Bartholins and Skenes: normal                 Vagina: normal appearing vagina with normal color and discharge, no lesions              Cervix: no lesions              Pap taken: No. Bimanual Exam:  Uterus:  normal size, contour, position, consistency, mobility, non-tender              Adnexa: no mass, fullness, tenderness              Rectal exam: Yes.  .  Confirms.              Anus:  normal sphincter tone, no lesions  Chaperone was present for exam.  Assessment:   Well woman visit with normal exam. Osteopenia.  Hx of breast and pancreatic cancer in mother.  Hx renal stones.  HTN.   Plan: Mammogram screening. Recommended self breast  awareness. Pap and HR HPV as above. Guidelines for Calcium, Vitamin D, regular exercise program including cardiovascular and weight bearing exercise. Labs with PCP.  We discussed importance or reducing risk factors for cardiovascular disease.  BMD ordered.  Follow up annually and prn.   After visit summary provided.

## 2017-10-26 ENCOUNTER — Encounter: Payer: Self-pay | Admitting: Obstetrics and Gynecology

## 2017-10-26 ENCOUNTER — Other Ambulatory Visit: Payer: Self-pay

## 2017-10-26 ENCOUNTER — Ambulatory Visit (INDEPENDENT_AMBULATORY_CARE_PROVIDER_SITE_OTHER): Payer: Medicare HMO | Admitting: Obstetrics and Gynecology

## 2017-10-26 VITALS — BP 130/82 | HR 80 | Resp 14 | Ht 65.0 in | Wt 242.0 lb

## 2017-10-26 DIAGNOSIS — Z780 Asymptomatic menopausal state: Secondary | ICD-10-CM | POA: Diagnosis not present

## 2017-10-26 DIAGNOSIS — Z01419 Encounter for gynecological examination (general) (routine) without abnormal findings: Secondary | ICD-10-CM

## 2017-10-26 DIAGNOSIS — M8580 Other specified disorders of bone density and structure, unspecified site: Secondary | ICD-10-CM

## 2017-10-26 NOTE — Patient Instructions (Signed)

## 2017-11-02 ENCOUNTER — Other Ambulatory Visit: Payer: Self-pay | Admitting: Obstetrics and Gynecology

## 2017-11-02 DIAGNOSIS — Z139 Encounter for screening, unspecified: Secondary | ICD-10-CM

## 2017-12-06 ENCOUNTER — Other Ambulatory Visit: Payer: Self-pay | Admitting: Neurology

## 2017-12-06 DIAGNOSIS — G4701 Insomnia due to medical condition: Secondary | ICD-10-CM

## 2017-12-06 DIAGNOSIS — G2581 Restless legs syndrome: Secondary | ICD-10-CM

## 2017-12-06 DIAGNOSIS — D509 Iron deficiency anemia, unspecified: Secondary | ICD-10-CM

## 2017-12-20 ENCOUNTER — Ambulatory Visit
Admission: RE | Admit: 2017-12-20 | Discharge: 2017-12-20 | Disposition: A | Discharge status: Home / Self Care | Payer: Medicare HMO | Source: Ambulatory Visit | Attending: Obstetrics and Gynecology | Admitting: Obstetrics and Gynecology

## 2017-12-20 DIAGNOSIS — Z1231 Encounter for screening mammogram for malignant neoplasm of breast: Secondary | ICD-10-CM | POA: Diagnosis not present

## 2017-12-20 DIAGNOSIS — Z139 Encounter for screening, unspecified: Secondary | ICD-10-CM

## 2017-12-20 DIAGNOSIS — Z780 Asymptomatic menopausal state: Secondary | ICD-10-CM

## 2017-12-20 DIAGNOSIS — M8580 Other specified disorders of bone density and structure, unspecified site: Secondary | ICD-10-CM

## 2017-12-20 DIAGNOSIS — M85851 Other specified disorders of bone density and structure, right thigh: Secondary | ICD-10-CM | POA: Diagnosis not present

## 2018-01-25 IMAGING — MR MR ANKLE*L* W/O CM
4 of 5 series · 15 of 40 positions shown · non-contrast
Comparison: None.

CLINICAL DATA: Heel pain for approximately 10 months.

EXAM:
MRI OF THE LEFT ANKLE WITHOUT CONTRAST
TECHNIQUE: Multiplanar, multisequence MR imaging of the ankle was performed. No
intravenous contrast was administered.

[Series 3: T2 fat-sat · axial · 3.5mm · 0.25mm/px · z∈[-76,+21]mm · 6 of 28 slices shown (1 of 2)]
[im 1/28]
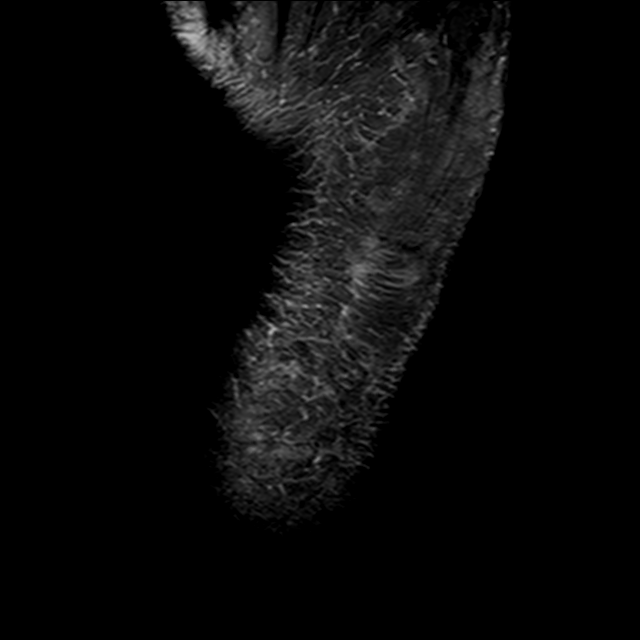
[im 4/28]
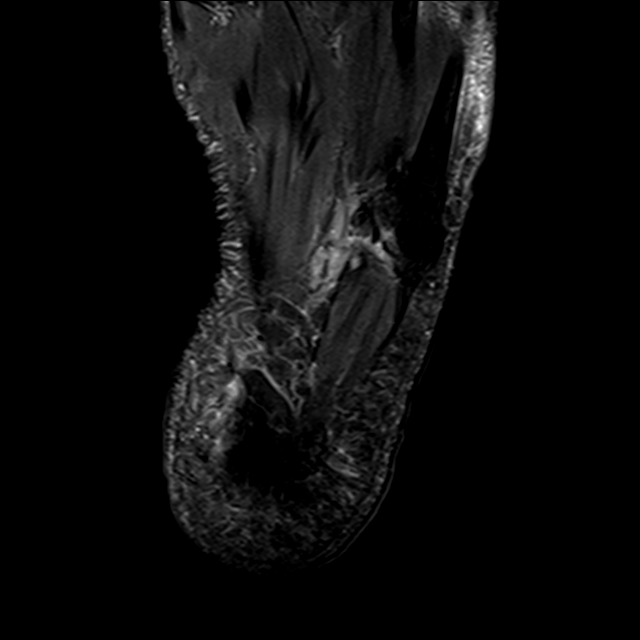
[im 7/28]
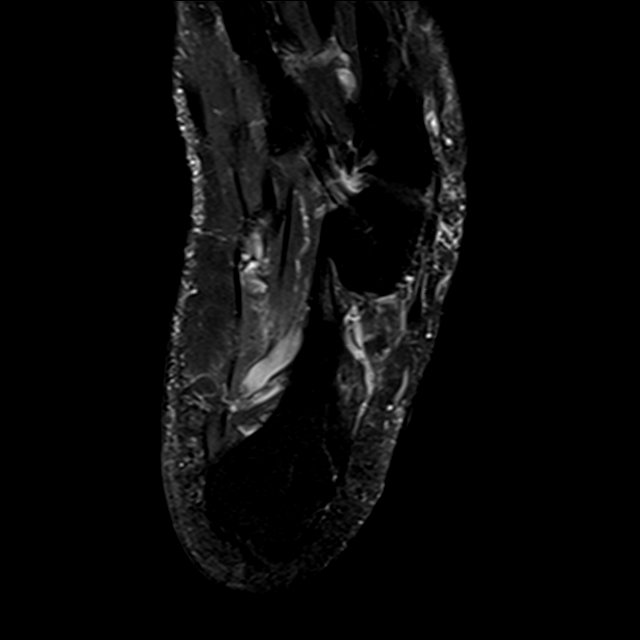
[im 11/28]
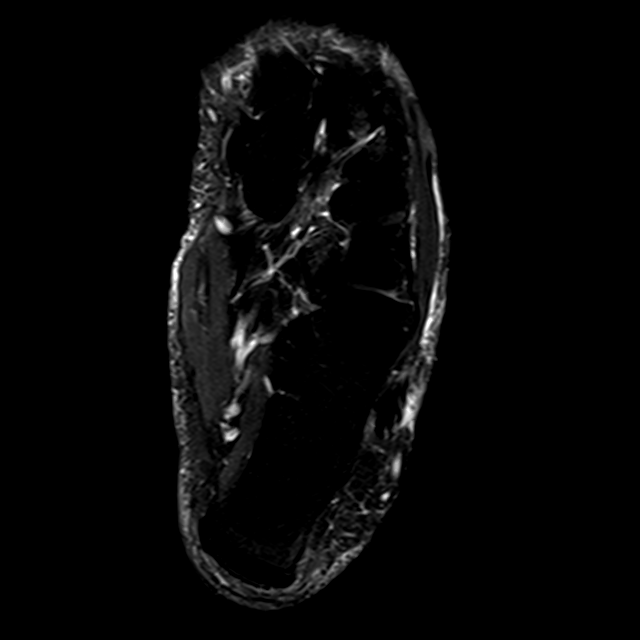
[im 14/28]
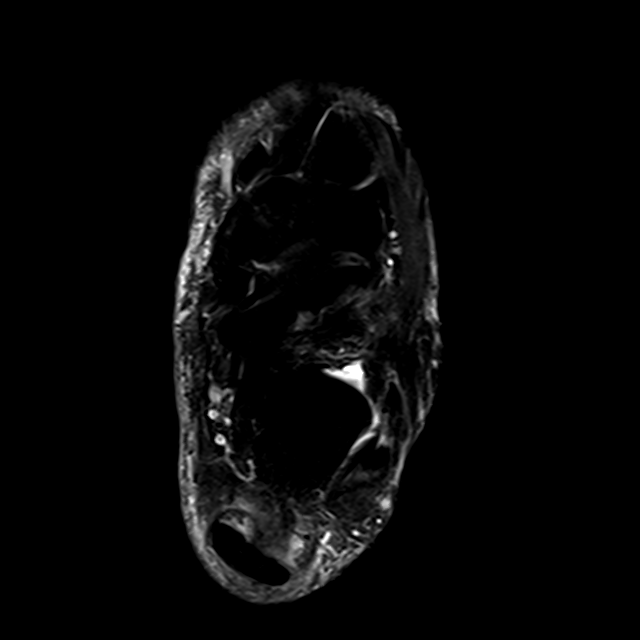
[im 24/28]
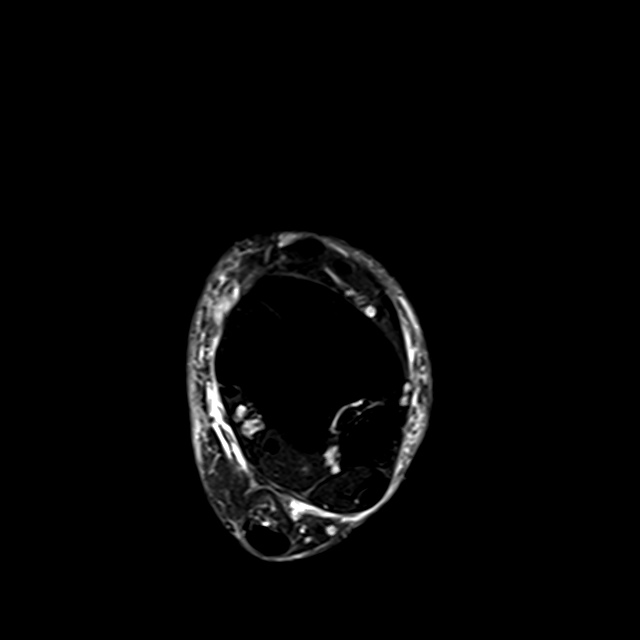

[Series 5: T2 fat-sat · coronal · 3.5mm · 0.35mm/px · 3 of 35 slices shown (2 of 2)]
[im 4/35]
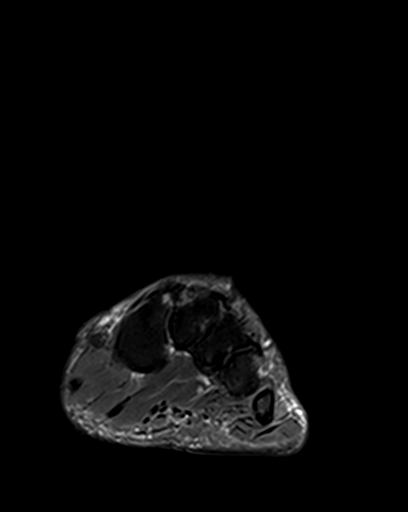
[im 18/35]
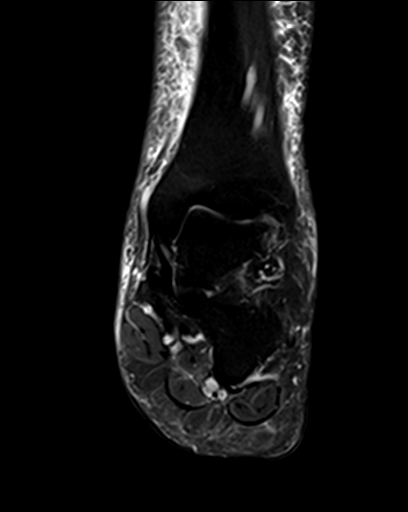
[im 31/35]
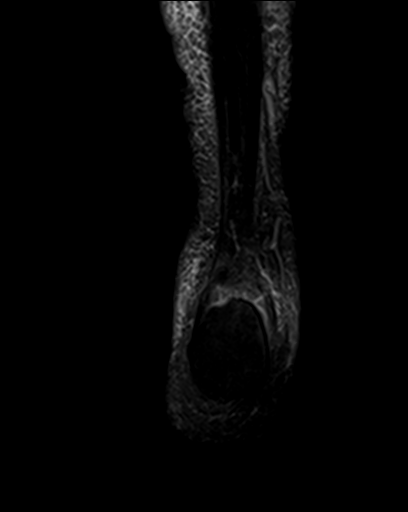

[Series 6: T1 · axial · 3.5mm · 0.28mm/px · z∈[-63,+21]mm · 3 of 28 slices shown (1 of 2)]
[im 4/28]
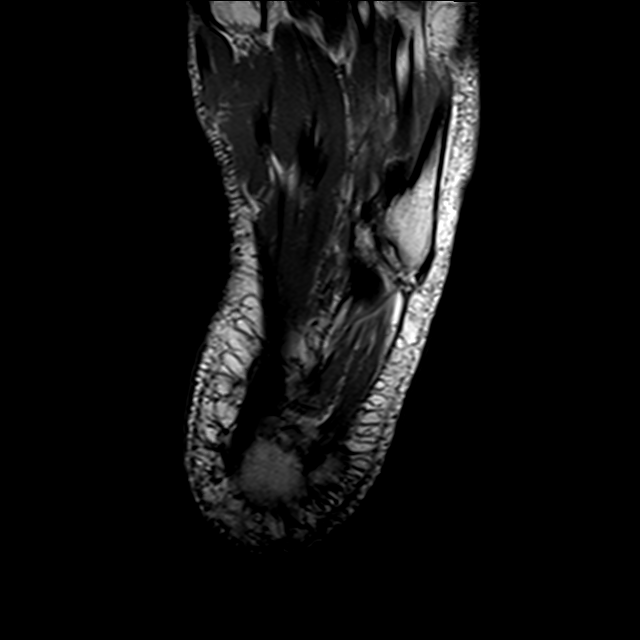
[im 16/28]
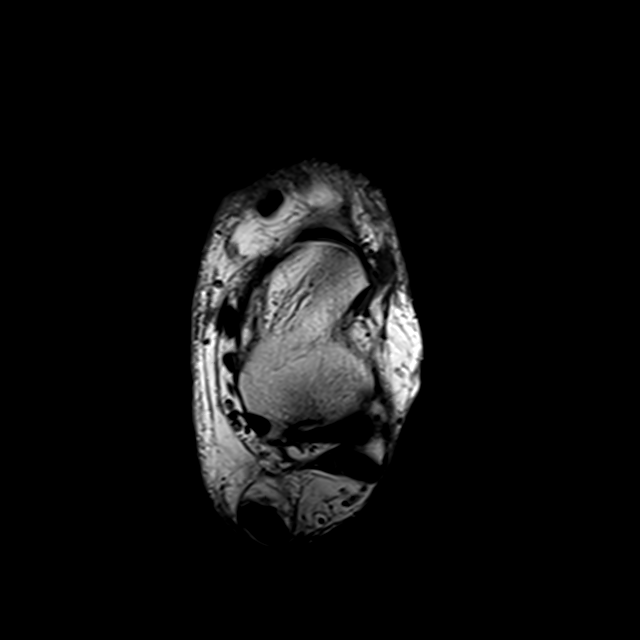
[im 24/28]
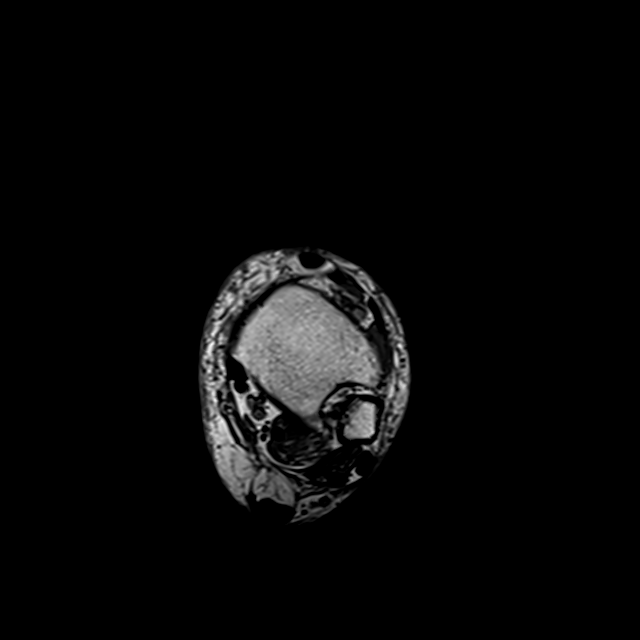

[Series 7: T1 · sagittal · 3.0mm · 0.33mm/px · 3 of 21 slices shown (2 of 2)]
[im 5/21]
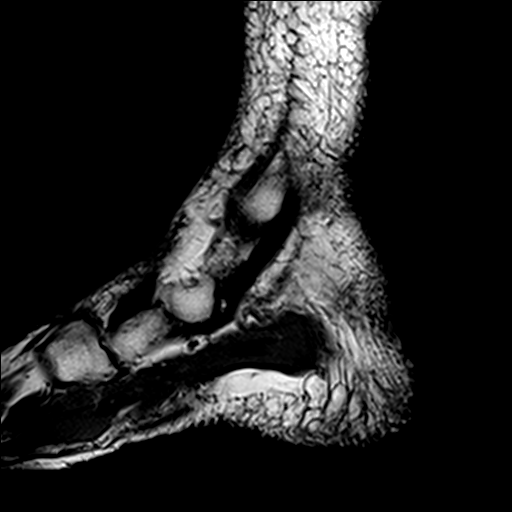
[im 13/21]
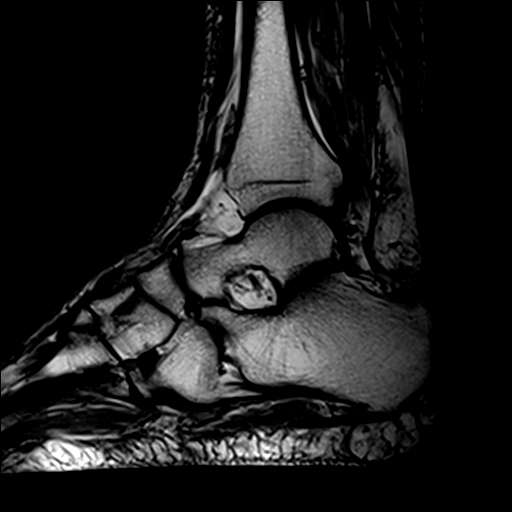
[im 21/21]
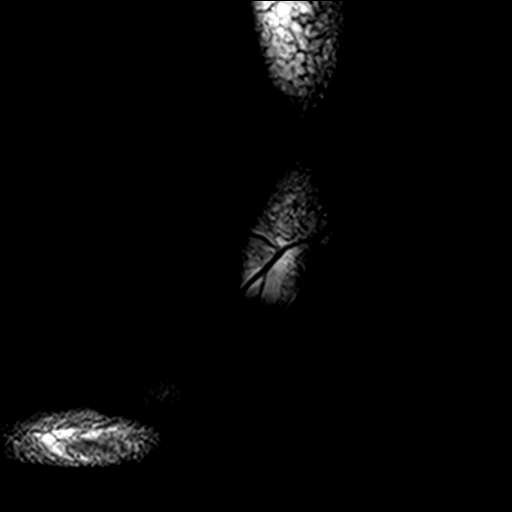

[15 of 40 positions shown; findings below may reference images not displayed]

FINDINGS: TENDONS

Peroneal: Fairly extensive longitudinal split type tear involving
the peroneus brevis tendon but no complete rupture. The peroneus
longus tendon is intact.

Posteromedial: Intact

Anterior: Intact

Achilles: Thickening of the Achilles tendon and interstitial changes
suggesting chronic tendinopathy/ tendinitis. No partial or
full-thickness tear. There is associated mild reactive marrow edema
in the calcaneus and also retrocalcaneal bursitis.

Plantar Fascia: Intact

LIGAMENTS

Lateral: Intact

Medial: Intact

CARTILAGE

Ankle Joint: Mild degenerative changes. No chondral defects or
osteochondral lesion. No joint effusion.

Subtalar Joints/Sinus Tarsi: Mild degenerative changes. No joint
effusion. The sinus tarsi is normal. The cervical and interosseous
ligaments are intact. The spring ligament is intact. Mild midfoot
degenerative changes.

Bones: No stress fracture, AVN or osteochondral lesions.

Other: The foot and ankle musculature appear normal.
IMPRESSION: 1. Mild chronic Achilles tendinopathy/tendinitis. No partial or
full-thickness tear. Associated mild reactive marrow edema in the
calcaneus can't retrocalcaneal bursitis.
2. Fairly extensive longitudinal split type tear involving the
proximal peroneus brevis tendon without rupture.
3. Mild degenerative changes but no stress fracture or osteochondral
lesions.

## 2018-02-03 ENCOUNTER — Other Ambulatory Visit: Payer: Self-pay | Admitting: Neurology

## 2018-02-03 DIAGNOSIS — G2581 Restless legs syndrome: Secondary | ICD-10-CM

## 2018-02-03 DIAGNOSIS — D509 Iron deficiency anemia, unspecified: Secondary | ICD-10-CM

## 2018-02-03 DIAGNOSIS — G4761 Periodic limb movement disorder: Secondary | ICD-10-CM

## 2018-02-03 DIAGNOSIS — G4701 Insomnia due to medical condition: Secondary | ICD-10-CM

## 2018-04-06 DIAGNOSIS — J208 Acute bronchitis due to other specified organisms: Secondary | ICD-10-CM | POA: Diagnosis not present

## 2018-04-06 DIAGNOSIS — B9689 Other specified bacterial agents as the cause of diseases classified elsewhere: Secondary | ICD-10-CM | POA: Diagnosis not present

## 2018-04-06 DIAGNOSIS — I10 Essential (primary) hypertension: Secondary | ICD-10-CM | POA: Diagnosis not present

## 2018-04-19 DIAGNOSIS — R69 Illness, unspecified: Secondary | ICD-10-CM | POA: Diagnosis not present

## 2018-05-09 ENCOUNTER — Other Ambulatory Visit: Payer: Self-pay | Admitting: Neurology

## 2018-05-09 DIAGNOSIS — D509 Iron deficiency anemia, unspecified: Secondary | ICD-10-CM

## 2018-05-09 DIAGNOSIS — G4701 Insomnia due to medical condition: Secondary | ICD-10-CM

## 2018-05-09 DIAGNOSIS — G4761 Periodic limb movement disorder: Secondary | ICD-10-CM

## 2018-05-09 DIAGNOSIS — G2581 Restless legs syndrome: Secondary | ICD-10-CM

## 2018-05-11 DIAGNOSIS — R69 Illness, unspecified: Secondary | ICD-10-CM | POA: Diagnosis not present

## 2018-05-17 ENCOUNTER — Other Ambulatory Visit: Payer: Self-pay | Admitting: Neurology

## 2018-05-17 ENCOUNTER — Telehealth: Payer: Self-pay | Admitting: Neurology

## 2018-05-17 DIAGNOSIS — G4761 Periodic limb movement disorder: Secondary | ICD-10-CM

## 2018-05-17 DIAGNOSIS — D509 Iron deficiency anemia, unspecified: Secondary | ICD-10-CM

## 2018-05-17 DIAGNOSIS — G2581 Restless legs syndrome: Secondary | ICD-10-CM

## 2018-05-17 DIAGNOSIS — G4701 Insomnia due to medical condition: Secondary | ICD-10-CM

## 2018-05-17 MED ORDER — ROTIGOTINE 6 MG/24HR TD PT24: MEDICATED_PATCH | TRANSDERMAL | 0 refills | Status: DC

## 2018-05-17 NOTE — Telephone Encounter (Signed)
Refill request sent for the patient 

## 2018-05-17 NOTE — Telephone Encounter (Signed)
Pt has called for a refill of [place] [address] [place]

## 2018-05-19 ENCOUNTER — Ambulatory Visit: Payer: Medicare HMO | Admitting: Adult Health

## 2018-05-19 ENCOUNTER — Telehealth: Payer: Self-pay | Admitting: *Deleted

## 2018-05-19 NOTE — Telephone Encounter (Signed)
Patient came in this morning for her appointment and could not be seen because the computer was down.  She would like a call back.  Questions about Neuro Patch.  Is there any incentive offered by they manufacturer? Her copay is $500 for a 3 month supply.  She will also need to reschedule her appointment.   Please call.

## 2018-05-19 NOTE — Telephone Encounter (Signed)
I called pt and she will need to call back tomorrow for appt.  But neupro patch PAP possible if qualifies.  May check on line at neupr patch pt assistance program and look at form to see if would qualify for that., Otherwise tier reduction thru insurance possible.

## 2018-05-23 ENCOUNTER — Telehealth: Payer: Self-pay | Admitting: *Deleted

## 2018-05-23 NOTE — Telephone Encounter (Signed)
Spoke to pt and she will return call to r/s appt due to last Thursday power problem (computer) to schedule with MM/NP.  If she calls you may r/s.

## 2018-05-23 NOTE — Telephone Encounter (Signed)
Called mobile of pt.  Phone stopped ringing.  Pt aware I called her to r/s appt for her this am.  I will wait for her to return call.

## 2018-07-16 DIAGNOSIS — Z87891 Personal history of nicotine dependence: Secondary | ICD-10-CM | POA: Diagnosis not present

## 2018-07-16 DIAGNOSIS — I10 Essential (primary) hypertension: Secondary | ICD-10-CM | POA: Diagnosis not present

## 2018-07-16 DIAGNOSIS — S0003XA Contusion of scalp, initial encounter: Secondary | ICD-10-CM | POA: Diagnosis not present

## 2018-07-16 DIAGNOSIS — G8911 Acute pain due to trauma: Secondary | ICD-10-CM | POA: Diagnosis not present

## 2018-07-16 DIAGNOSIS — W1789XA Other fall from one level to another, initial encounter: Secondary | ICD-10-CM | POA: Diagnosis not present

## 2018-07-16 DIAGNOSIS — Z79899 Other long term (current) drug therapy: Secondary | ICD-10-CM | POA: Diagnosis not present

## 2018-07-16 DIAGNOSIS — M542 Cervicalgia: Secondary | ICD-10-CM | POA: Diagnosis not present

## 2018-07-18 NOTE — Progress Notes (Signed)
[facility]  PATIENT: Amy Burnett DOB: Feb 14, 1951   REASON FOR VISIT: Follow-up for restless legs,  obstructive sleep apnea with CPAP HISTORY FROM: Patient    HISTORY OF PRESENT ILLNESS:Amy Burnett, 68 year old caucasian, right handed female patient of Dr. Veronia Beets returns for followup.   The patient developed restless legs unrelated to blood loss, pregnancy, or any known Absorption difficulties. During her pregnancies she did not experience restless legs but developed a carpal tunnel syndrome in one pregnancy and in another had toxemia or HELP syndrome.   She was treated with Requip since 2006 and had an iron infusion., first at a lower dose but since than slowly has advanced to 4 mg daily -she takes 1 tablet twice in daytime and 2 tablets at night.  She has never been on the extended release dose of Requip. Also on Prometrium progesterone supplement on a Vivelle-Dot and takes Xanax as needed on a when necessary basis.  She is on Zoloft 2 of  100 mg tablets every morning for OCD.  She was diagnosed with low Ferritin, and has not taken oral iron after a singe iron infusion. This took place 8 years ago.   Chest with Christmas 2015 the patient had an exacerbation of sleep problems and restless legs which brings her also to the practice today. Her overall sleep time overnight at the time was probably less than 6 hours. Maybe closer to 5, she has no nocturia breaks reported. She has trouble falling asleep due to the restless legs restless legs however these seem not to wake her during her sleep at night.  Sleep is not extremely fragmented right now. Daytime sleepiness however has been a problem as the intake of her Requip dose of 1 mg correlates with excessive sleepiness occurring about 30-45 minutes later. She is currently on Ropinirole 4 mg total dose.She says she was in 2013 diagnosed with sleep apnea and is using CPAP at 4 cm. She was evaluated at [facility], by Dr.  Moshe Salisbury.  She gets no regular exercise. She knows she needs to lose some weight.   Interval history from 10/24/2015,CD Amy Burnett is here today with her compliance report from her CPAP. She has not been able to use the CPAP machine over 4 hours nightly on average only 2 hours and 11 minutes. The machine is set at 8 cm water without EPR, the residual AHI is 2.0. She does not have significant air leaks. She reports that he on average anyway sleeps only 4-5 hours however this is not reflected in her compliance. She thinks that she inadvertently takes it off. However the compliance is poor. She is not afraid to use it she usually enjoys using it. Reflected is today of very high degree of daytime sleepiness was 21 out of 24 points. And the geriatric depression score was endorsed at 2 out of 15. The patch that she has used to controlled her restless legs continues to work well -she's not woken up by restless legs at night. UPDATE 08/15/2017[name], 68 year old female returns for follow-up. She has obstructive sleep apnea on CPAP. 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 CPAP compliance today usage 29 out of 30 days for 97% average usage 5 hours 51 minutes. Pressure set at 8 cm AHI 2.5. Minimal leak. ESS today 12. She is on Provigil with good response, she says it has really made a difference. HLA was positive. Explained to the patient the test actually means. She remains on Neupro for  restless legs. Reviewed how to correctly put the patch on. She returns for reevaluation UPDATE 03/21/2018[name], 68 year old female returns for follow-up with obstructive sleep apnea on CPAP. CPAP compliance today is 40% for greater than 4 hours 53% less than 4 hours. Average usage 3 hours 56 minutes set pressure 8 cm AHI of 4.1. She claims Provigil is not working but she does not take it every day. ESS score today 17 fatigue severity score 50. When she pulled her chip out today to bring her compliance she noted that it  was  not in her machine completely. 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 She also has a history of restless legs  the Neupro is beneficial and controls her symptoms. She returns for reevaluation.  03/15/2017 CDI had the pleasure of seeing Amy Burnett today on 03/15/2017, a patient I have followed for over 3 years with history of restless legs and obstructive sleep apnea treated with CPAP. The patient has used CPAP 28 out of 30 days, but she is not able to sleep longer than 3 hours on block. Often she has to get up then attributing this to restless legs and periodic limb movement. She is often sleep deprived, would like to sleep longer but feels. There have been some nights with high air leaks but mostly 4/80% of the night her air leak is low. Pressure is ideal at 8 cm water with a residual of 3.2 she may even tolerate a little higher pressure. Fatigue severity is endorsed at 40 points and Epworth sleepiness score is endorsed at 16 out of 24 points. I feel he needs not to concentrate as much on for CPAP compliance but on her sleep duration. I would like to try a medication to help her sleep longer and thus improve her compliance. This medication should not make restless legs worse. UPDATE 2/4/2020[name], 68 year old female returns for follow-up with history of restless legs and obstructive sleep apnea.  Compliance data dated 06/19/2020 to 09/02/2018 shows compliance greater than 4 hours at 20% less than 4 hours at 53% for total 22 out of 30 days at 73% average usage 3 hours 8 minutes.  Set pressure 8 cm leak 95th percentile 24 AHI 3.4.  ESS 15 patient know she has a leak with her mask however she tells me she does not know who her equipment company is.  It is [employer].  She remains on Neupro for restless legs which has worked well for her however with her PPO insurance she has to pay about $500 every 3 months.  There is notation in the chart that she has failed orals in the past due to  hypersomnolence.  She has done well on Neupro and it has controlled her restless legs well.  She is on Provigil during the day for daytime sleepiness.  She returns for reevaluation   REVIEW OF SYSTEMS: Full 14 system review of systems performed and notable only for those listed, all others are neg:  Constitutional: neg  Cardiovascular: neg Ear/Nose/Throat: neg  Skin: neg Eyes: neg Respiratory: neg Gastroitestinal: neg  Hematology/Lymphatic: neg  Endocrine: neg Musculoskeletal neg Allergy/Immunology: neg Neurological: neg Psychiatric: Anxiety  Sleep : Restless legs obstructive sleep apnea with CPAP, daytime sleepiness   ALLERGIES: No Known Allergies  HOME MEDICATIONS: Outpatient Medications Prior to Visit  Medication Sig Dispense Refill  . albuterol (PROAIR HFA) 108 (90 Base) MCG/ACT inhaler Inhale into the lungs.    . ALPRAZolam (XANAX) 0.5 MG tablet Take 0.5  mg by mouth at bedtime as needed.     . hydrochlorothiazide (HYDRODIURIL) 25 MG tablet Take 25 mg by mouth every morning.  0  . modafinil (PROVIGIL) 200 MG tablet TAKE 1 TABLET BY MOUTH AT 7AM AND 1 TABLET AT 12 NOON, OR NO LATER THAN 2PM 60 tablet 3  . rotigotine (NEUPRO) 6 MG/24HR APPLY 1 PATCH ONCE DAILY 90 patch 0  . sertraline (ZOLOFT) 100 MG tablet Take 200 mg by mouth every morning.     Marland Kitchen QUEtiapine (SEROQUEL) 25 MG tablet Take 1 tablet (25 mg total) by mouth at bedtime. (Patient not taking: Reported on 07/19/2018) 90 tablet 2   No facility-administered medications prior to visit.     PAST MEDICAL HISTORY: Past Medical History:  Diagnosis Date  . Anxiety   . Excessive daytime sleepiness   . Heart murmur   . History of kidney stones   . History of panic attacks   . Hx of adenomatous polyp of colon 01/03/2017  . Hypertension   . OSA on CPAP   . Osteopenia, senile 2017   hip  . Rectal lipoma 2016  . RLS (restless legs syndrome)   . Sleep apnea     PAST SURGICAL HISTORY: Past Surgical History:    Procedure Laterality Date  . Achilles Tendinitis    . CARDIOVASCULAR STRESS TEST  09-19-2009   normal perfusion study/  ef 80%  . COLONOSCOPY  03/09/2006  . D & C HYSTEROSCOPY W/ RESECTION MYOMA  01-28-2009  . D & C HYSTEROSCOPY/  RESECTION MYOMA AND ENDOMETRIAL POLYP  09-23-2009  . EUS N/A 06/01/2013   Procedure: LOWER ENDOSCOPIC ULTRASOUND (EUS);  Surgeon: Milus Banister, MD;  Location: [facility];  Service: Endoscopy;  Laterality: N/A;  . EUS N/A 12/06/2014   Procedure: LOWER ENDOSCOPIC ULTRASOUND (EUS);  Surgeon: Milus Banister, MD;  Location: [facility];  Service: Endoscopy;  Laterality: N/A;  . LEFT URETEROSCOPIC LASER LITHOTRIPSY STONE EXTRACTION/ STENT PLACEMENT  12-13-2001  . TONSILLECTOMY AND ADENOIDECTOMY  age 89  . TRANSANAL EXCISION OF RECTAL MASS N/A 04/11/2015   Procedure: TRANSANAL EXCISION OF DISTAL RECTAL MASS;  Surgeon: [name], MD;  Location: [place];  Service: General;  Laterality: N/A;  . TRANSTHORACIC ECHOCARDIOGRAM  09-16-2009   mild LVH,  ef 60-65%,  grade I diastolic dysfunction/  trivial MR and TR    FAMILY HISTORY: Family History  Problem Relation Age of Onset  . Breast cancer Mother 44  . Pancreatic cancer Mother 45  . Other Sister        pituitary adenoma  . Colon cancer Neg Hx   . Esophageal cancer Neg Hx   . Rectal cancer Neg Hx   . Stomach cancer Neg Hx     SOCIAL HISTORY: Social History   Socioeconomic History  . Marital status: Married    Spouse name: [name]  . Number of children: 2  . Years of education: college  . Highest education level: Not on file  Occupational History    Employer: SELF-EMPLOYED    Comment: full time works for her self  Social Needs  . Financial resource strain: Not on file  . Food insecurity:    Worry: Not on file    Inability: Not on file  . Transportation needs:    Medical: Not on file    Non-medical: Not on file  Tobacco Use  . Smoking status: Former Smoker    Years: 15.00     Types: Cigarettes  Last attempt to quit: 06/15/1985    Years since quitting: 33.1  . Smokeless tobacco: Never Used  . Tobacco comment: Quit 32 years ago.  Substance and Sexual Activity  . Alcohol use: Yes    Alcohol/week: 6.0 - 8.0 standard drinks    Types: 6 - 8 Glasses of wine per week    Comment: with dinner  . Drug use: No  . Sexual activity: Not Currently    Partners: Male    Birth control/protection: Post-menopausal  Lifestyle  . Physical activity:    Days per week: Not on file    Minutes per session: Not on file  . Stress: Not on file  Relationships  . Social connections:    Talks on phone: Not on file    Gets together: Not on file    Attends religious service: Not on file    Active member of club or organization: Not on file    Attends meetings of clubs or organizations: Not on file    Relationship status: Not on file  . Intimate partner violence:    Fear of current or ex partner: Not on file    Emotionally abused: Not on file    Physically abused: Not on file    Forced sexual activity: Not on file  Other Topics Concern  . Not on file  Social History Narrative   Patient lives at home with her husband [name])    Patient works full time   Education- college   Right handed   Caffeine- diet coke three daily     PHYSICAL EXAM  Vitals:   07/19/18 0935  BP: (!) 154/89  Pulse: 95  Weight: 252 lb 3.2 oz (114.4 kg)  Height: _0  (1.651 m)   Body mass index is 41.97 kg/m.  Generalized: Well developed, obese female in no acute distress  Head: normocephalic and atraumatic,. Oropharynx benign , mallopatti 4 Neck: Supple, circumference 17 Lungs: Clear Musculoskeletal: No deformity  Skin no rash or edema Neurological examination   Mentation: Alert oriented to time, place, history taking. Attention span and concentration appropriate. Recent and remote memory intact.  Follows all commands speech and language fluent. ESS 15.  Cranial nerve II-XII: .Pupils were  equal round reactive to light extraocular movements were full, visual field were full on confrontational test. Facial sensation and strength were normal. hearing was intact to finger rubbing bilaterally. Uvula tongue midline. head turning and shoulder shrug were normal and symmetric.Tongue protrusion into cheek strength was normal. Motor: normal bulk and tone, full strength in the BUE, BLE,  Coordination: finger-nose-finger, heel-to-shin bilaterally, no dysmetria Gait and Station: Rising up from seated position without assistance, normal stance,  moderate stride, good arm swing, smooth turning, able to perform tiptoe, and heel walking without difficulty. Tandem gait is steady  DIAGNOSTIC DATA (LABS, IMAGING, TESTING) -  ASSESSMENT AND PLAN  68 y.o. year old female  has a past medical history of Anxiety;  History of panic attacks; OSA on CPAP;   and RLS (restless legs syndrome). here to follow-up. She also has hypersomnia.  Compliance data dated 06/19/2020 to 09/02/2018 shows compliance greater than 4 hours at 20% less than 4 hours at 53% for total 22 out of 30 days at 73% average usage 3 hours 8 minutes.  Set pressure 8 cm leak 95th percentile 24 AHI 3.4.  ESS 15   PLAN: Continue CPAP at current settings Compliance 20% for past month Pt has a leak and needs mask refit and supplies [facility] pt given written RX Continue Neupro at current dose for restless legs She also has hypersomnia on oral dopaminergic agonists for RLS -stay on  Nupro patch. Continue  Provigil to 1 at 7am and 1 at 12noon for daytime drowsiness ESS 15 Follow-up in 3  Months  for compliance check I explained in particular the risks and ramifications of untreated moderate to severe OSA, especially with respect to cardiovascular disease  including congestive heart failure, difficult to treat hypertension, cardiac arrhythmias, or stroke.  Symptoms of untreated OSA include daytime sleepiness, memory problems, mood irritability  and mood disorder such as depression and anxiety, lack of energy, as well as recurrent headaches. We talked about trying to maintain a healthy lifestyle in general, as well as the importance of weight control. I encouraged the patient to eat healthy, exercise daily and keep well hydrated,  I spent 25 min  in total face to face time with the patient more than 50% of which was spent counseling and coordination of care, reviewing test results reviewing medications and discussing and reviewing the diagnosis of OSA and importance of compliance.  Stay on the Neupro since she has failed oral agents in the past.  Provigil for daytime drowsiness  N yet 60 her she was best to come back in regard to study last night has been held for study and she said I am not she took her car in for something and she went again to get back to like 7:00 she denied and talked her on phone she takes she [place] me so do we have on lunch today okay right leg okay [place], [facility], [facility], [facility] [address], [place], [place] [address] [phone]

## 2018-07-19 ENCOUNTER — Encounter: Payer: Self-pay | Admitting: Nurse Practitioner

## 2018-07-19 ENCOUNTER — Ambulatory Visit: Payer: Medicare HMO | Admitting: Nurse Practitioner

## 2018-07-19 VITALS — BP 154/89 | HR 95 | Ht 65.0 in | Wt 252.2 lb

## 2018-07-19 DIAGNOSIS — D509 Iron deficiency anemia, unspecified: Secondary | ICD-10-CM | POA: Diagnosis not present

## 2018-07-19 DIAGNOSIS — G4701 Insomnia due to medical condition: Secondary | ICD-10-CM

## 2018-07-19 DIAGNOSIS — G2581 Restless legs syndrome: Secondary | ICD-10-CM | POA: Diagnosis not present

## 2018-07-19 DIAGNOSIS — Z9989 Dependence on other enabling machines and devices: Secondary | ICD-10-CM

## 2018-07-19 DIAGNOSIS — G4710 Hypersomnia, unspecified: Secondary | ICD-10-CM | POA: Diagnosis not present

## 2018-07-19 DIAGNOSIS — G4733 Obstructive sleep apnea (adult) (pediatric): Secondary | ICD-10-CM | POA: Diagnosis not present

## 2018-07-19 MED ORDER — MODAFINIL 200 MG PO TABS: ORAL_TABLET | ORAL | 5 refills | Status: DC

## 2018-07-19 NOTE — Patient Instructions (Signed)
Continue CPAP at current settings Compliance 20% for past month Pt has a leak and needs mask refit and supplies Continue Neupro at current dose for restless legs She also has hypersomnia on oral dopaminergic agonists for RLS -stay on  Nupro patch. Continue  Provigil to 1 at 7am and 1 at 12noon for daytime drowsiness ESS 15 Follow-up in 3  Months  for compliance check

## 2018-07-21 ENCOUNTER — Ambulatory Visit: Payer: Medicare HMO | Admitting: Adult Health

## 2018-07-27 ENCOUNTER — Telehealth: Payer: Self-pay | Admitting: *Deleted

## 2018-07-27 NOTE — Telephone Encounter (Signed)
Called spoke to pt, who had supplies last sent to her from [facility] in [place], [place] in 2014.  Per Vikki Ports at [facility] in [place], [place] she stated that pt had SS in 2013.  Will fax to me.  To transfer DME care need order, F2F, and Sleep study sent to [facility].

## 2018-07-27 NOTE — Telephone Encounter (Signed)
LMVM for [facility] to return call, relating to her CPAP supplies.  (note 12-23-15 relating to [facility] following pt).

## 2018-07-27 NOTE — Telephone Encounter (Signed)
LMVM for Amy Burnett to return call.  Sent community message to [facility], [place] as well.

## 2018-07-27 NOTE — Telephone Encounter (Signed)
[facility] returned Rn's call

## 2018-07-28 NOTE — Telephone Encounter (Signed)
Per [facility], Jara, received notes and will contact pt relating cpap supplies. I reached out to pt and they [facility] have already been in touch with pt.  She will see them in office.  She appreciated call.

## 2018-08-01 DIAGNOSIS — G4733 Obstructive sleep apnea (adult) (pediatric): Secondary | ICD-10-CM | POA: Diagnosis not present

## 2018-08-12 ENCOUNTER — Telehealth: Payer: Self-pay | Admitting: Nurse Practitioner

## 2018-08-12 DIAGNOSIS — G4761 Periodic limb movement disorder: Secondary | ICD-10-CM

## 2018-08-12 DIAGNOSIS — D509 Iron deficiency anemia, unspecified: Secondary | ICD-10-CM

## 2018-08-12 DIAGNOSIS — G4701 Insomnia due to medical condition: Secondary | ICD-10-CM

## 2018-08-12 DIAGNOSIS — G2581 Restless legs syndrome: Secondary | ICD-10-CM

## 2018-08-12 MED ORDER — ROTIGOTINE 6 MG/24HR TD PT24: MEDICATED_PATCH | TRANSDERMAL | 1 refills | Status: DC

## 2018-08-12 NOTE — Telephone Encounter (Signed)
Pt is requesting refill for rotigotine (NEUPRO) 6 MG/24HR sent to [facility]. Pt has 2 patches left

## 2018-08-12 NOTE — Telephone Encounter (Signed)
Spoke to pt and let her know as well.

## 2018-09-12 DIAGNOSIS — Z0000 Encounter for general adult medical examination without abnormal findings: Secondary | ICD-10-CM | POA: Diagnosis not present

## 2018-10-06 DIAGNOSIS — M25561 Pain in right knee: Secondary | ICD-10-CM | POA: Diagnosis not present

## 2018-10-14 DIAGNOSIS — M25561 Pain in right knee: Secondary | ICD-10-CM | POA: Diagnosis not present

## 2018-10-14 DIAGNOSIS — M1711 Unilateral primary osteoarthritis, right knee: Secondary | ICD-10-CM | POA: Diagnosis not present

## 2018-10-14 DIAGNOSIS — M222X1 Patellofemoral disorders, right knee: Secondary | ICD-10-CM | POA: Diagnosis not present

## 2018-11-02 ENCOUNTER — Other Ambulatory Visit: Payer: Self-pay

## 2018-11-04 ENCOUNTER — Ambulatory Visit (INDEPENDENT_AMBULATORY_CARE_PROVIDER_SITE_OTHER): Payer: Medicare HMO | Admitting: Obstetrics and Gynecology

## 2018-11-04 ENCOUNTER — Encounter: Payer: Self-pay | Admitting: Obstetrics and Gynecology

## 2018-11-04 ENCOUNTER — Other Ambulatory Visit (HOSPITAL_COMMUNITY)
Admission: RE | Admit: 2018-11-04 | Discharge: 2018-11-04 | Disposition: A | Discharge status: Home / Self Care | Payer: Medicare HMO | Source: Ambulatory Visit | Attending: Obstetrics and Gynecology | Admitting: Obstetrics and Gynecology

## 2018-11-04 ENCOUNTER — Other Ambulatory Visit: Payer: Self-pay

## 2018-11-04 VITALS — BP 116/78 | HR 80 | Temp 97.3°F | Resp 14 | Ht 64.75 in | Wt 246.0 lb

## 2018-11-04 DIAGNOSIS — Z01419 Encounter for gynecological examination (general) (routine) without abnormal findings: Secondary | ICD-10-CM | POA: Insufficient documentation

## 2018-11-04 NOTE — Patient Instructions (Signed)

## 2018-11-04 NOTE — Progress Notes (Signed)
68 y.o. G50P2002 Married Caucasian female here for annual exam.    Asking about her bone density.  States she is shrinking.   No vaginal bleeding.  No bladder or bowel concerns.   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  Feeling grateful.  Labs with PCP.   PCP: Anastasia Pall, MD    Patient's last menstrual period was 01/04/2012.           Sexually active: No.  The current method of family planning is post menopausal status.    Exercising: Yes.    walking Smoker:  Former   Health Maintenance: Pap:  05-21-16 Neg, 04-24-15 Neg:Neg HR HPV History of abnormal Pap:  no MMG:  12/20/17 BIRADS 1 negative/density b Colonoscopy:  12/24/16 Polyp removed BMD:   12/20/17  Result  Osteopenia TDaP:  UTD with PCP.  Vaccine 08/2008.   She will do with her PCP.  Gardasil:   n/a HIV: negative with PCP Hep C: negative with PCP Screening Labs:  PCP   reports that she quit smoking about 33 years ago. Her smoking use included cigarettes. She quit after 15.00 years of use. She has never used smokeless tobacco. She reports current alcohol use of about 6.0 - 8.0 standard drinks of alcohol per week. She reports that she does not use drugs.  Past Medical History:  Diagnosis Date  . Anxiety   . Excessive daytime sleepiness   . Heart murmur   . History of kidney stones   . History of panic attacks   . Hx of adenomatous polyp of colon 01/03/2017  . Hypertension   . OSA on CPAP   . Osteopenia, senile 2017   hip  . Rectal lipoma 2016  . RLS (restless legs syndrome)   . Sleep apnea     Past Surgical History:  Procedure Laterality Date  . Achilles Tendinitis    . CARDIOVASCULAR STRESS TEST  09-19-2009   normal perfusion study/  ef 80%  . COLONOSCOPY  03/09/2006  . D & C HYSTEROSCOPY W/ RESECTION MYOMA  01-28-2009  . D & C HYSTEROSCOPY/  RESECTION MYOMA AND ENDOMETRIAL POLYP  09-23-2009  . EUS N/A 06/01/2013   Procedure: LOWER ENDOSCOPIC ULTRASOUND (EUS);  Surgeon: Milus Banister, MD;   Location: [facility];  Service: Endoscopy;  Laterality: N/A;  . EUS N/A 12/06/2014   Procedure: LOWER ENDOSCOPIC ULTRASOUND (EUS);  Surgeon: Milus Banister, MD;  Location: [facility];  Service: Endoscopy;  Laterality: N/A;  . LEFT URETEROSCOPIC LASER LITHOTRIPSY STONE EXTRACTION/ STENT PLACEMENT  12-13-2001  . TONSILLECTOMY AND ADENOIDECTOMY  age 52  . TRANSANAL EXCISION OF RECTAL MASS N/A 04/11/2015   Procedure: TRANSANAL EXCISION OF DISTAL RECTAL MASS;  Surgeon: [name], MD;  Location: [place];  Service: General;  Laterality: N/A;  . TRANSTHORACIC ECHOCARDIOGRAM  09-16-2009   mild LVH,  ef 60-65%,  grade I diastolic dysfunction/  trivial MR and TR    Current Outpatient Medications  Medication Sig Dispense Refill  . albuterol (PROAIR HFA) 108 (90 Base) MCG/ACT inhaler Inhale into the lungs.    . ALPRAZolam (XANAX) 0.5 MG tablet Take 0.5 mg by mouth at bedtime as needed.     . furosemide (LASIX) 20 MG tablet Take by mouth.    . hydrochlorothiazide (HYDRODIURIL) 25 MG tablet Take 25 mg by mouth every morning.  0  . modafinil (PROVIGIL) 200 MG tablet TAKE 1 TABLET BY MOUTH AT 7AM AND 1 TABLET AT 12 NOON, OR NO  LATER THAN 2PM 60 tablet 5  . potassium chloride (K-DUR) 10 MEQ tablet Take by mouth.    . rotigotine (NEUPRO) 6 MG/24HR APPLY 1 PATCH ONCE DAILY 90 patch 1  . sertraline (ZOLOFT) 100 MG tablet Take 200 mg by mouth every morning.      No current facility-administered medications for this visit.     Family History  Problem Relation Age of Onset  . Breast cancer Mother 63  . Pancreatic cancer Mother 34  . Other Sister        pituitary adenoma  . Colon cancer Neg Hx   . Esophageal cancer Neg Hx   . Rectal cancer Neg Hx   . Stomach cancer Neg Hx     Review of Systems  Constitutional: Negative.   HENT: Negative.   Eyes: Negative.   Respiratory: Negative.   Cardiovascular: Negative.   Gastrointestinal: Negative.   Endocrine: Negative.    Genitourinary: Negative.   Musculoskeletal: Negative.   Skin: Negative.   Allergic/Immunologic: Negative.   Neurological: Negative.   Hematological: Negative.   Psychiatric/Behavioral: Negative.     Exam:   BP 116/78 (BP Location: Right Arm, Patient Position: Sitting, Cuff Size: Large)   Pulse 80   Temp (!) 97.3 F (36.3 C) (Temporal)   Resp 14   Ht 5' 4.75" (1.645 m)   Wt 246 lb (111.6 kg)   LMP 01/04/2012   BMI 41.25 kg/m     General appearance: alert, cooperative and appears stated age Head: Normocephalic, without obvious abnormality, atraumatic Neck: no adenopathy, supple, symmetrical, trachea midline and thyroid normal to inspection and palpation Lungs: clear to auscultation bilaterally Breasts: normal appearance, no masses or tenderness, No nipple retraction or dimpling, No nipple discharge or bleeding, No axillary or supraclavicular adenopathy Heart: regular rate and rhythm Abdomen: soft, non-tender; no masses, no organomegaly Extremities: extremities normal, atraumatic, no cyanosis or edema Skin: Skin color, texture, turgor normal. No rashes or lesions Lymph nodes: Cervical, supraclavicular, and axillary nodes normal. No abnormal inguinal nodes palpated Neurologic: Grossly normal  Pelvic: External genitalia:  no lesions              Urethra:  normal appearing urethra with no masses, tenderness or lesions              Bartholins and Skenes: normal                 Vagina: normal appearing vagina with normal color and discharge, no lesions              Cervix: no lesions              Pap taken: Yes.   Bimanual Exam:  Uterus:  normal size, contour, position, consistency, mobility, non-tender              Adnexa: no mass, fullness, tenderness              Rectal exam: Yes.  .  Confirms.              Anus:  normal sphincter tone, no lesions  Chaperone was present for exam.  Assessment:   Well woman visit with normal exam. Osteopenia.  Hx of breast and pancreatic  cancer in mother.  Hx renal stones.  HTN.   Plan: Mammogram screening. Recommended self breast awareness. Pap and HR HPV as above. Guidelines for Calcium, Vitamin D, regular exercise program including cardiovascular and weight bearing exercise. Labs with PCP.  BMD next year. Follow up  annually and prn.   After visit summary provided.

## 2018-11-09 LAB — CYTOLOGY - PAP: Diagnosis: NEGATIVE

## 2018-11-14 ENCOUNTER — Ambulatory Visit: Payer: Medicare HMO | Admitting: Neurology

## 2018-11-21 DIAGNOSIS — E782 Mixed hyperlipidemia: Secondary | ICD-10-CM | POA: Diagnosis not present

## 2018-11-21 DIAGNOSIS — Z23 Encounter for immunization: Secondary | ICD-10-CM | POA: Diagnosis not present

## 2018-11-21 DIAGNOSIS — R69 Illness, unspecified: Secondary | ICD-10-CM | POA: Diagnosis not present

## 2018-11-21 DIAGNOSIS — G4730 Sleep apnea, unspecified: Secondary | ICD-10-CM | POA: Diagnosis not present

## 2018-11-21 DIAGNOSIS — I10 Essential (primary) hypertension: Secondary | ICD-10-CM | POA: Diagnosis not present

## 2019-01-02 ENCOUNTER — Encounter: Payer: Self-pay | Admitting: Neurology

## 2019-01-03 ENCOUNTER — Ambulatory Visit: Payer: Medicare HMO | Admitting: Adult Health

## 2019-01-03 ENCOUNTER — Other Ambulatory Visit: Payer: Self-pay

## 2019-01-03 ENCOUNTER — Encounter: Payer: Self-pay | Admitting: Adult Health

## 2019-01-03 VITALS — BP 130/80 | HR 89 | Temp 96.0°F | Ht 66.0 in | Wt 244.2 lb

## 2019-01-03 DIAGNOSIS — Z9989 Dependence on other enabling machines and devices: Secondary | ICD-10-CM | POA: Diagnosis not present

## 2019-01-03 DIAGNOSIS — G4733 Obstructive sleep apnea (adult) (pediatric): Secondary | ICD-10-CM

## 2019-01-03 DIAGNOSIS — G2581 Restless legs syndrome: Secondary | ICD-10-CM | POA: Diagnosis not present

## 2019-01-03 NOTE — Patient Instructions (Addendum)
Your Plan:  Continue Neupro  Restart modafinil  Continue CPAP  Order sent for new DME company, mask refitting If your symptoms worsen or you develop new symptoms please let us know.  Please call [facility] at [phone], and press option 1 when prompted. Their customer service representatives will be glad to assist you. If they are unable to answer, please leave a message and they will call you back. Make sure to leave your name and return phone number.   Thank you for coming to see [place] at [facility]. I hope we have been able to provide you high quality care today.  You may receive a patient satisfaction survey over the next few weeks. We would appreciate your feedback and comments so that we may continue to improve ourselves and the health of our patients.

## 2019-01-03 NOTE — Progress Notes (Signed)
PATIENT: Amy Burnett DOB: Jul 12, 1950  REASON FOR VISIT: follow up HISTORY FROM: patient  HISTORY OF PRESENT ILLNESS: Today 01/03/19:  [name] is a 68 year old female with a history of obstructive sleep apnea on CPAP and restless leg.  Her CPAP report indicates that she used her machine 29 out of 30 days for compliance of 97%.  She use her machine greater than 4 hours 21 days for compliance of 70%.  On average she uses her machine 4 hours and 34 minutes.  Her residual AHI is 3.8 on 8 cm of water.  Her leak in the 95th percentile is 13.9 L/min.  She states that she has not been taking modafinil consistently.  She states that most days she wakes up and still feels tired.  She states that her eyelids feel heavy.  She states that the Neupro patch continues to work well for her restless legs.  She states that the Neupro patch has not made her more tired.  She states that she rarely uses Xanax at bedtime.  She returns today for follow-up  The patient also notes that she is not happy with her DME company.  She feels that she can never get what she needs and would like to change to another company.  HISTORY 2/4/2020CM Ms. Amy Burnett, 68 year old female returns for follow-up with history of restless legs and obstructive sleep apnea.  Compliance data dated 06/19/2020 to 09/02/2018 shows compliance greater than 4 hours at 20% less than 4 hours at 53% for total 22 out of 30 days at 73% average usage 3 hours 8 minutes.  Set pressure 8 cm leak 95th percentile 24 AHI 3.4.  ESS 15 patient know she has a leak with her mask however she tells me she does not know who her equipment company is.  [facility].  She remains on Neupro for restless legs which has worked well for her however with her PPO insurance she has to pay about $500 every 3 months.  There is notation in the chart that she has failed orals in the past due to hypersomnolence.  She has done well on Neupro and it has controlled her restless legs  well.  She is on Provigil during the day for daytime sleepiness.  She returns for reevaluation   REVIEW OF SYSTEMS: Out of a complete 14 system review of symptoms, the patient complains only of the following symptoms, and all other reviewed systems are negative.  Epworth sleepiness score 17, fatigue severity score 28 ALLERGIES: No Known Allergies  HOME MEDICATIONS: Outpatient Medications Prior to Visit  Medication Sig Dispense Refill  . albuterol (PROAIR HFA) 108 (90 Base) MCG/ACT inhaler Inhale into the lungs.    . ALPRAZolam (XANAX) 0.5 MG tablet Take 0.5 mg by mouth at bedtime as needed.     . furosemide (LASIX) 20 MG tablet Take by mouth.    . hydrochlorothiazide (HYDRODIURIL) 25 MG tablet Take 25 mg by mouth every morning.  0  . modafinil (PROVIGIL) 200 MG tablet TAKE 1 TABLET BY MOUTH AT 7AM AND 1 TABLET AT 12 NOON, OR NO LATER THAN 2PM 60 tablet 5  . potassium chloride (K-DUR) 10 MEQ tablet Take by mouth.    . rotigotine (NEUPRO) 6 MG/24HR APPLY 1 PATCH ONCE DAILY 90 patch 1  . sertraline (ZOLOFT) 100 MG tablet Take 200 mg by mouth every morning.      No facility-administered medications prior to visit.     PAST MEDICAL HISTORY: Past  Medical History:  Diagnosis Date  . Anxiety   . Excessive daytime sleepiness   . Heart murmur   . History of kidney stones   . History of panic attacks   . Hx of adenomatous polyp of colon 01/03/2017  . Hypertension   . OSA on CPAP   . Osteopenia, senile 2017   hip  . Rectal lipoma 2016  . RLS (restless legs syndrome)   . Sleep apnea     PAST SURGICAL HISTORY: Past Surgical History:  Procedure Laterality Date  . Achilles Tendinitis    . CARDIOVASCULAR STRESS TEST  09-19-2009   normal perfusion study/  ef 80%  . COLONOSCOPY  03/09/2006  . D & C HYSTEROSCOPY W/ RESECTION MYOMA  01-28-2009  . D & C HYSTEROSCOPY/  RESECTION MYOMA AND ENDOMETRIAL POLYP  09-23-2009  . EUS N/A 06/01/2013   Procedure: LOWER ENDOSCOPIC ULTRASOUND (EUS);   Surgeon: Milus Banister, MD;  Location: [facility];  Service: Endoscopy;  Laterality: N/A;  . EUS N/A 12/06/2014   Procedure: LOWER ENDOSCOPIC ULTRASOUND (EUS);  Surgeon: Milus Banister, MD;  Location: [facility];  Service: Endoscopy;  Laterality: N/A;  . LEFT URETEROSCOPIC LASER LITHOTRIPSY STONE EXTRACTION/ STENT PLACEMENT  12-13-2001  . TONSILLECTOMY AND ADENOIDECTOMY  age 21  . TRANSANAL EXCISION OF RECTAL MASS N/A 04/11/2015   Procedure: TRANSANAL EXCISION OF DISTAL RECTAL MASS;  Surgeon: [name], MD;  Location: [facility];  Service: General;  Laterality: N/A;  . TRANSTHORACIC ECHOCARDIOGRAM  09-16-2009   mild LVH,  ef 60-65%,  grade I diastolic dysfunction/  trivial MR and TR    FAMILY HISTORY: Family History  Problem Relation Age of Onset  . Breast cancer Mother 61  . Pancreatic cancer Mother 38  . Other Sister        pituitary adenoma  . Colon cancer Neg Hx   . Esophageal cancer Neg Hx   . Rectal cancer Neg Hx   . Stomach cancer Neg Hx     SOCIAL HISTORY: Social History   Socioeconomic History  . Marital status: Married    Spouse name: [name]  . Number of children: 2  . Years of education: college  . Highest education level: Not on file  Occupational History    Employer: SELF-EMPLOYED    Comment: full time works for her self  Social Needs  . Financial resource strain: Not on file  . Food insecurity    Worry: Not on file    Inability: Not on file  . Transportation needs    Medical: Not on file    Non-medical: Not on file  Tobacco Use  . Smoking status: Former Smoker    Years: 15.00    Types: Cigarettes    Quit date: 06/15/1985    Years since quitting: 33.5  . Smokeless tobacco: Never Used  . Tobacco comment: Quit 32 years ago.  Substance and Sexual Activity  . Alcohol use: Yes    Alcohol/week: 6.0 - 8.0 standard drinks    Types: 6 - 8 Glasses of wine per week    Comment: with dinner  . Drug use: No  . Sexual activity: Not  Currently    Partners: Male    Birth control/protection: Post-menopausal  Lifestyle  . Physical activity    Days per week: Not on file    Minutes per session: Not on file  . Stress: Not on file  Relationships  . Social connections    Talks on phone: Not on  file    Gets together: Not on file    Attends religious service: Not on file    Active member of club or organization: Not on file    Attends meetings of clubs or organizations: Not on file    Relationship status: Not on file  . Intimate partner violence    Fear of current or ex partner: Not on file    Emotionally abused: Not on file    Physically abused: Not on file    Forced sexual activity: Not on file  Other Topics Concern  . Not on file  Social History Narrative   Patient lives at home with her husband [name])    Patient works full time   Education- college   Right handed   Caffeine- diet coke three daily      PHYSICAL EXAM  Vitals:   01/03/19 1032  BP: 130/80  Pulse: 89  Temp: (!) 96 F (35.6 C)  Weight: 244 lb 3.2 oz (110.8 kg)  Height: 5\' 6"  (1.676 m)   Body mass index is 39.41 kg/m.  Generalized: Well developed, in no acute distress  Chest: Lungs clear to auscultation bilaterally  Neurological examination  Mentation: Alert oriented to time, place, history taking. Follows all commands speech and language fluent Cranial nerve II-XII: Extraocular movements were full, visual field were full on confrontational test. Facial strength were normal. Head turning and shoulder shrug  were normal and symmetric. Motor: The motor testing reveals 5 over 5 strength of all 4 extremities. Good symmetric motor tone is noted throughout.  Sensory: Sensory testing is intact to soft touch on all 4 extremities. No evidence of extinction is noted.  Gait and station: Gait is normal.   DIAGNOSTIC DATA (LABS, IMAGING, TESTING) - I reviewed patient records, labs, notes, testing and imaging myself where available.  Lab Results   Component Value Date   WBC 7.0 09/23/2009   HGB 13.5 04/24/2015   HCT 42.3 09/23/2009   MCV 94.2 09/23/2009   PLT 167 09/23/2009      Component Value Date/Time   NA 142 09/18/2009 0807   K 4.3 09/18/2009 0807   CL 111 09/18/2009 0807   CO2 26 09/18/2009 0807   GLUCOSE 102 (H) 09/18/2009 0807   BUN 13 09/18/2009 0807   CREATININE 0.8 09/18/2009 0807   CALCIUM 9.1 09/18/2009 0807   PROT 6.8 09/18/2009 0807   ALBUMIN 4.1 09/18/2009 0807   AST 20 09/18/2009 0807   ALT 18 09/18/2009 0807   ALKPHOS 85 09/18/2009 0807   BILITOT 0.2 (L) 09/18/2009 0807   GFRNONAA 78.08 09/18/2009 0807   GFRAA 111 08/21/2008 0906   Lab Results  Component Value Date   CHOL 213 (H) 09/18/2009   HDL 70.40 09/18/2009   LDLDIRECT 138.8 09/18/2009   TRIG 72.0 09/18/2009   CHOLHDL 3 09/18/2009   Lab Results  Component Value Date   TSH 1.14 09/18/2009      ASSESSMENT AND PLAN 68 y.o. year old female  has a past medical history of Anxiety, Excessive daytime sleepiness, Heart murmur, History of kidney stones, History of panic attacks, adenomatous polyp of colon (01/03/2017), Hypertension, OSA on CPAP, Osteopenia, senile (2017), Rectal lipoma (2016), RLS (restless legs syndrome), and Sleep apnea. here with:  1.  Obstructive sleep apnea on CPAP 2.  Restless leg syndrome  The patient CPAP download shows excellent compliance and good treatment of her apnea.  She is encouraged to try to use machine greater than 4 hours each night.  I will send an order for her DME company to be changed to [employer].  She will continue on the same settings which is 8 cm of water.  I also will request a mask refitting.  She is encouraged to take modafinil every morning to see if this improves her daytime sleepiness.  She will continue on Neupro patch for restless legs.  She is advised that if her symptoms worsen or she develops new symptoms she should let us know.  She will follow-up in 6 months or sooner if needed.       Ward Givens, MSN, NP-C 01/03/2019, 11:07 AM [facility] [address] [place], [place] [address] [phone]

## 2019-01-04 NOTE — Progress Notes (Signed)
Received message back from [employer] that did receive message about transferring to [employer].

## 2019-01-10 DIAGNOSIS — G4733 Obstructive sleep apnea (adult) (pediatric): Secondary | ICD-10-CM | POA: Diagnosis not present

## 2019-01-11 DIAGNOSIS — R69 Illness, unspecified: Secondary | ICD-10-CM | POA: Diagnosis not present

## 2019-02-09 DIAGNOSIS — R69 Illness, unspecified: Secondary | ICD-10-CM | POA: Diagnosis not present

## 2019-02-10 DIAGNOSIS — G4733 Obstructive sleep apnea (adult) (pediatric): Secondary | ICD-10-CM | POA: Diagnosis not present

## 2019-02-14 ENCOUNTER — Telehealth: Payer: Self-pay

## 2019-02-14 DIAGNOSIS — G4701 Insomnia due to medical condition: Secondary | ICD-10-CM

## 2019-02-14 DIAGNOSIS — D509 Iron deficiency anemia, unspecified: Secondary | ICD-10-CM

## 2019-02-14 DIAGNOSIS — G2581 Restless legs syndrome: Secondary | ICD-10-CM

## 2019-02-14 MED ORDER — MODAFINIL 200 MG PO TABS: ORAL_TABLET | ORAL | 5 refills | Status: DC

## 2019-02-14 NOTE — Addendum Note (Signed)
Addended by: Trudie Buckler on: 02/14/2019 03:40 PM   Modules accepted: Orders

## 2019-02-14 NOTE — Telephone Encounter (Signed)
Pt is requesting a refill on Modafinil 200 mg. [place] drug registry verified and last refill was on 09/25/2018 # 60 for a 30 day supply by our office. Last visit was with MM, NP on 01/03/2019.  Preferred pharmacy is [facility].

## 2019-02-14 NOTE — Telephone Encounter (Signed)
Prescription sent

## 2019-02-21 ENCOUNTER — Other Ambulatory Visit: Payer: Self-pay | Admitting: Neurology

## 2019-02-21 DIAGNOSIS — G4701 Insomnia due to medical condition: Secondary | ICD-10-CM

## 2019-02-21 DIAGNOSIS — D509 Iron deficiency anemia, unspecified: Secondary | ICD-10-CM

## 2019-02-21 DIAGNOSIS — G2581 Restless legs syndrome: Secondary | ICD-10-CM

## 2019-02-21 DIAGNOSIS — G4761 Periodic limb movement disorder: Secondary | ICD-10-CM

## 2019-02-21 MED ORDER — NEUPRO 6 MG/24HR TD PT24: MEDICATED_PATCH | TRANSDERMAL | 1 refills | Status: DC

## 2019-03-13 DIAGNOSIS — G4733 Obstructive sleep apnea (adult) (pediatric): Secondary | ICD-10-CM | POA: Diagnosis not present

## 2019-03-13 DIAGNOSIS — D1801 Hemangioma of skin and subcutaneous tissue: Secondary | ICD-10-CM | POA: Diagnosis not present

## 2019-03-13 DIAGNOSIS — L720 Epidermal cyst: Secondary | ICD-10-CM | POA: Diagnosis not present

## 2019-03-13 DIAGNOSIS — L918 Other hypertrophic disorders of the skin: Secondary | ICD-10-CM | POA: Diagnosis not present

## 2019-03-13 DIAGNOSIS — D2272 Melanocytic nevi of left lower limb, including hip: Secondary | ICD-10-CM | POA: Diagnosis not present

## 2019-03-13 DIAGNOSIS — L821 Other seborrheic keratosis: Secondary | ICD-10-CM | POA: Diagnosis not present

## 2019-04-03 ENCOUNTER — Other Ambulatory Visit: Payer: Self-pay | Admitting: Obstetrics and Gynecology

## 2019-04-03 DIAGNOSIS — Z1231 Encounter for screening mammogram for malignant neoplasm of breast: Secondary | ICD-10-CM

## 2019-04-12 DIAGNOSIS — G4733 Obstructive sleep apnea (adult) (pediatric): Secondary | ICD-10-CM | POA: Diagnosis not present

## 2019-04-18 DIAGNOSIS — S0501XA Injury of conjunctiva and corneal abrasion without foreign body, right eye, initial encounter: Secondary | ICD-10-CM | POA: Diagnosis not present

## 2019-04-19 DIAGNOSIS — S0501XD Injury of conjunctiva and corneal abrasion without foreign body, right eye, subsequent encounter: Secondary | ICD-10-CM | POA: Diagnosis not present

## 2019-04-25 ENCOUNTER — Other Ambulatory Visit: Payer: Self-pay

## 2019-04-25 ENCOUNTER — Ambulatory Visit
Admission: RE | Admit: 2019-04-25 | Discharge: 2019-04-25 | Disposition: A | Discharge status: Home / Self Care | Payer: Medicare HMO | Source: Ambulatory Visit | Attending: Obstetrics and Gynecology | Admitting: Obstetrics and Gynecology

## 2019-04-25 DIAGNOSIS — Z1231 Encounter for screening mammogram for malignant neoplasm of breast: Secondary | ICD-10-CM | POA: Diagnosis not present

## 2019-05-13 DIAGNOSIS — G4733 Obstructive sleep apnea (adult) (pediatric): Secondary | ICD-10-CM | POA: Diagnosis not present

## 2019-06-12 DIAGNOSIS — G4733 Obstructive sleep apnea (adult) (pediatric): Secondary | ICD-10-CM | POA: Diagnosis not present

## 2019-07-04 ENCOUNTER — Ambulatory Visit: Payer: Medicare Other | Attending: Internal Medicine

## 2019-07-04 DIAGNOSIS — Z23 Encounter for immunization: Secondary | ICD-10-CM

## 2019-07-04 NOTE — Progress Notes (Signed)
   Covid-19 Vaccination Clinic  Name:  Amy Burnett    MRN: [id number] DOB: 1950/09/25  07/04/2019  [name] was observed post Covid-19 immunization for 15 minutes without incidence. She was provided with Vaccine Information Sheet and instruction to access the V-Safe system.   [name] was instructed to call 911 with any severe reactions post vaccine: Marland Kitchen Difficulty breathing  . Swelling of your face and throat  . A fast heartbeat  . A bad rash all over your body  . Dizziness and weakness    Immunizations Administered    Name Date Dose VIS Date Route   Pfizer COVID-19 Vaccine 07/04/2019 11:52 AM 0.3 mL 05/26/2019 Intramuscular   Manufacturer: [place]   Lot: [id number]   [place]: [id number]

## 2019-07-13 DIAGNOSIS — G4733 Obstructive sleep apnea (adult) (pediatric): Secondary | ICD-10-CM | POA: Diagnosis not present

## 2019-07-19 ENCOUNTER — Other Ambulatory Visit: Payer: Self-pay

## 2019-07-19 ENCOUNTER — Ambulatory Visit: Payer: Medicare HMO | Admitting: Adult Health

## 2019-07-19 ENCOUNTER — Encounter: Payer: Self-pay | Admitting: Adult Health

## 2019-07-19 VITALS — BP 147/97 | HR 85 | Temp 97.1°F | Ht 66.0 in | Wt 241.6 lb

## 2019-07-19 DIAGNOSIS — G2581 Restless legs syndrome: Secondary | ICD-10-CM | POA: Diagnosis not present

## 2019-07-19 DIAGNOSIS — Z9989 Dependence on other enabling machines and devices: Secondary | ICD-10-CM | POA: Diagnosis not present

## 2019-07-19 DIAGNOSIS — G4733 Obstructive sleep apnea (adult) (pediatric): Secondary | ICD-10-CM

## 2019-07-19 NOTE — Patient Instructions (Signed)
Your Plan:  Continue using CPAP nightly and greater than 4 hours each night Continue Neupro  If your symptoms worsen or you develop new symptoms please let us know.   Thank you for coming to see [place] at [facility]. I hope we have been able to provide you high quality care today.  You may receive a patient satisfaction survey over the next few weeks. We would appreciate your feedback and comments so that we may continue to improve ourselves and the health of our patients.

## 2019-07-19 NOTE — Progress Notes (Signed)
PATIENT: Amy Burnett DOB: Feb 05, 1951  REASON FOR VISIT: follow up HISTORY FROM: patient  HISTORY OF PRESENT ILLNESS: Today 07/19/19:  Ms. Ausburn is a 69 year old female with a history of obstructive sleep apnea on CPAP and restless leg syndrome.  She returns today for follow-up.  She reports that she stopped using the CPAP in July.  She states that she could not sleep longer than 4 hours within a month.  She states that since she stopped using it she feels that she sleeps longer.  She does note that she still snores.  She wakes up with a dry mouth.  She continues to use Provigil during the day for daytime sleepiness.  She reports the Neupro patch continues to control her restless legs.  She returns today for an evaluation.  HISTORY 01/03/19:  Ms. Westrick is a 69 year old female with a history of obstructive sleep apnea on CPAP and restless leg.  Her CPAP report indicates that she used her machine 29 out of 30 days for compliance of 97%.  She use her machine greater than 4 hours 21 days for compliance of 70%.  On average she uses her machine 4 hours and 34 minutes.  Her residual AHI is 3.8 on 8 cm of water.  Her leak in the 95th percentile is 13.9 L/min.  She states that she has not been taking modafinil consistently.  She states that most days she wakes up and still feels tired.  She states that her eyelids feel heavy.  She states that the Neupro patch continues to work well for her restless legs.  She states that the Neupro patch has not made her more tired.  She states that she rarely uses Xanax at bedtime.  She returns today for follow-up  REVIEW OF SYSTEMS: Out of a complete 14 system review of symptoms, the patient complains only of the following symptoms, and all other reviewed systems are negative.  ALLERGIES: No Known Allergies  HOME MEDICATIONS: Outpatient Medications Prior to Visit  Medication Sig Dispense Refill  . albuterol (PROAIR HFA) 108 (90 Base) MCG/ACT inhaler Inhale  into the lungs as needed.     . ALPRAZolam (XANAX) 0.5 MG tablet Take 0.5 mg by mouth as needed.     . furosemide (LASIX) 20 MG tablet Take by mouth.    . hydrochlorothiazide (HYDRODIURIL) 25 MG tablet Take 25 mg by mouth every morning.  0  . modafinil (PROVIGIL) 200 MG tablet TAKE 1 TABLET BY MOUTH AT 7AM AND 1 TABLET AT 12 NOON, OR NO LATER THAN 2PM 60 tablet 5  . potassium chloride (K-DUR) 10 MEQ tablet Take by mouth.    . rotigotine (NEUPRO) 6 MG/24HR APPLY 1 PATCH ONCE DAILY 90 patch 1  . sertraline (ZOLOFT) 100 MG tablet Take 200 mg by mouth every morning.      No facility-administered medications prior to visit.    PAST MEDICAL HISTORY: Past Medical History:  Diagnosis Date  . Anxiety   . Excessive daytime sleepiness   . Heart murmur   . History of kidney stones   . History of panic attacks   . Hx of adenomatous polyp of colon 01/03/2017  . Hypertension   . OSA on CPAP   . Osteopenia, senile 2017   hip  . Rectal lipoma 2016  . RLS (restless legs syndrome)   . Sleep apnea     PAST SURGICAL HISTORY: Past Surgical History:  Procedure Laterality Date  . Achilles Tendinitis    .  CARDIOVASCULAR STRESS TEST  09-19-2009   normal perfusion study/  ef 80%  . COLONOSCOPY  03/09/2006  . D & C HYSTEROSCOPY W/ RESECTION MYOMA  01-28-2009  . D & C HYSTEROSCOPY/  RESECTION MYOMA AND ENDOMETRIAL POLYP  09-23-2009  . EUS N/A 06/01/2013   Procedure: LOWER ENDOSCOPIC ULTRASOUND (EUS);  Surgeon: Milus Banister, MD;  Location: [facility];  Service: Endoscopy;  Laterality: N/A;  . EUS N/A 12/06/2014   Procedure: LOWER ENDOSCOPIC ULTRASOUND (EUS);  Surgeon: Milus Banister, MD;  Location: [facility];  Service: Endoscopy;  Laterality: N/A;  . LEFT URETEROSCOPIC LASER LITHOTRIPSY STONE EXTRACTION/ STENT PLACEMENT  12-13-2001  . TONSILLECTOMY AND ADENOIDECTOMY  age 89  . TRANSANAL EXCISION OF RECTAL MASS N/A 04/11/2015   Procedure: TRANSANAL EXCISION OF DISTAL RECTAL MASS;  Surgeon:  [name], MD;  Location: [facility];  Service: General;  Laterality: N/A;  . TRANSTHORACIC ECHOCARDIOGRAM  09-16-2009   mild LVH,  ef 60-65%,  grade I diastolic dysfunction/  trivial MR and TR    FAMILY HISTORY: Family History  Problem Relation Age of Onset  . Breast cancer Mother 28  . Pancreatic cancer Mother 5  . Other Sister        pituitary adenoma  . Colon cancer Neg Hx   . Esophageal cancer Neg Hx   . Rectal cancer Neg Hx   . Stomach cancer Neg Hx     SOCIAL HISTORY: Social History   Socioeconomic History  . Marital status: Married    Spouse name: [name]  . Number of children: 2  . Years of education: college  . Highest education level: Not on file  Occupational History    Employer: SELF-EMPLOYED    Comment: full time works for her self  Tobacco Use  . Smoking status: Former Smoker    Years: 15.00    Types: Cigarettes    Quit date: 06/15/1985    Years since quitting: 34.1  . Smokeless tobacco: Never Used  . Tobacco comment: Quit 32 years ago.  Substance and Sexual Activity  . Alcohol use: Yes    Alcohol/week: 6.0 - 8.0 standard drinks    Types: 6 - 8 Glasses of wine per week    Comment: with dinner  . Drug use: No  . Sexual activity: Not Currently    Partners: Male    Birth control/protection: Post-menopausal  Other Topics Concern  . Not on file  Social History Narrative   Patient lives at home with her husband [name])    Patient works full time   Education- college   Right handed   Caffeine- diet coke three daily   Social Determinants of Radio broadcast assistant Strain:   . Difficulty of Paying Living Expenses: Not on file  Food Insecurity:   . Worried About Charity fundraiser in the Last Year: Not on file  . Ran Out of Food in the Last Year: Not on file  Transportation Needs:   . Lack of Transportation (Medical): Not on file  . Lack of Transportation (Non-Medical): Not on file  Physical Activity:   . Days of Exercise per  Week: Not on file  . Minutes of Exercise per Session: Not on file  Stress:   . Feeling of Stress : Not on file  Social Connections:   . Frequency of Communication with Friends and Family: Not on file  . Frequency of Social Gatherings with Friends and Family: Not on file  . Attends Religious  Services: Not on file  . Active Member of Clubs or Organizations: Not on file  . Attends Archivist Meetings: Not on file  . Marital Status: Not on file  Intimate Partner Violence:   . Fear of Current or Ex-Partner: Not on file  . Emotionally Abused: Not on file  . Physically Abused: Not on file  . Sexually Abused: Not on file      PHYSICAL EXAM  Vitals:   07/19/19 1032  BP: (!) 147/97  Pulse: 85  Temp: (!) 97.1 F (36.2 C)  TempSrc: Oral  Weight: 241 lb 9.6 oz (109.6 kg)  Height: 5\' 6"  (1.676 m)   Body mass index is 39 kg/m.  Generalized: Well developed, in no acute distress  Chest: Lungs clear to auscultation bilaterally  Neurological examination  Mentation: Alert oriented to time, place, history taking. Follows all commands speech and language fluent Cranial nerve II-XII: Extraocular movements were full, visual field were full on confrontational test Head turning and shoulder shrug  were normal and symmetric. Motor: The motor testing reveals 5 over 5 strength of all 4 extremities. Good symmetric motor tone is noted throughout.  Sensory: Sensory testing is intact to soft touch on all 4 extremities. No evidence of extinction is noted.  Gait and station: Gait is normal.    DIAGNOSTIC DATA (LABS, IMAGING, TESTING) - I reviewed patient records, labs, notes, testing and imaging myself where available.  Lab Results  Component Value Date   WBC 7.0 09/23/2009   HGB 13.5 04/24/2015   HCT 42.3 09/23/2009   MCV 94.2 09/23/2009   PLT 167 09/23/2009      Component Value Date/Time   NA 142 09/18/2009 0807   K 4.3 09/18/2009 0807   CL 111 09/18/2009 0807   CO2 26  09/18/2009 0807   GLUCOSE 102 (H) 09/18/2009 0807   BUN 13 09/18/2009 0807   CREATININE 0.8 09/18/2009 0807   CALCIUM 9.1 09/18/2009 0807   PROT 6.8 09/18/2009 0807   ALBUMIN 4.1 09/18/2009 0807   AST 20 09/18/2009 0807   ALT 18 09/18/2009 0807   ALKPHOS 85 09/18/2009 0807   BILITOT 0.2 (L) 09/18/2009 0807   GFRNONAA 78.08 09/18/2009 0807   GFRAA 111 08/21/2008 0906   Lab Results  Component Value Date   CHOL 213 (H) 09/18/2009   HDL 70.40 09/18/2009   LDLDIRECT 138.8 09/18/2009   TRIG 72.0 09/18/2009   CHOLHDL 3 09/18/2009   No results found for: HGBA1C No results found for: VITAMINB12 Lab Results  Component Value Date   TSH 1.14 09/18/2009      ASSESSMENT AND PLAN 69 y.o. year old female  has a past medical history of Anxiety, Excessive daytime sleepiness, Heart murmur, History of kidney stones, History of panic attacks, adenomatous polyp of colon (01/03/2017), Hypertension, OSA on CPAP, Osteopenia, senile (2017), Rectal lipoma (2016), RLS (restless legs syndrome), and Sleep apnea. here with:   1.  Obstructive sleep apnea on CPAP  -Had a long discussion regarding the risk associated with untreated sleep apnea.  Patient is amenable to retrying the CPAP.  Advised patient to try to use some during the day while watching television to get used to wearing the mask.  2.  Restless leg syndrome  -Continue Neupro patch  Advised patient that if her symptoms worsen or she develops new symptoms she should let us know.  We will see her back in 3 months to evaluate CPAP compliance   I spent 25 minutes with the patient.  50% of this time was spent reviewing her chart, discussing risk associated with untreated sleep apnea and discussing plan of care   Ward Givens, MSN, NP-C 07/19/2019, 10:44 AM [facility] [address], [place], [place] [address] [phone]

## 2019-07-24 ENCOUNTER — Ambulatory Visit: Payer: Medicare HMO | Attending: Internal Medicine

## 2019-07-24 DIAGNOSIS — Z23 Encounter for immunization: Secondary | ICD-10-CM | POA: Insufficient documentation

## 2019-07-24 NOTE — Progress Notes (Signed)
   Covid-19 Vaccination Clinic  Name:  Amy Burnett    MRN: [id number] DOB: Feb 24, 1951  07/24/2019  [name] was observed post Covid-19 immunization for 15 minutes without incidence. She was provided with Vaccine Information Sheet and instruction to access the V-Safe system.   [name] was instructed to call 911 with any severe reactions post vaccine: Marland Kitchen Difficulty breathing  . Swelling of your face and throat  . A fast heartbeat  . A bad rash all over your body  . Dizziness and weakness    Immunizations Administered    Name Date Dose VIS Date Route   Pfizer COVID-19 Vaccine 07/24/2019 10:32 AM 0.3 mL 05/26/2019 Intramuscular   Manufacturer: [place]   Lot: [id number]   [place]: [id number]

## 2019-08-02 ENCOUNTER — Ambulatory Visit: Payer: Medicare HMO

## 2019-08-13 DIAGNOSIS — G4733 Obstructive sleep apnea (adult) (pediatric): Secondary | ICD-10-CM | POA: Diagnosis not present

## 2019-08-14 DIAGNOSIS — R69 Illness, unspecified: Secondary | ICD-10-CM | POA: Diagnosis not present

## 2019-08-31 ENCOUNTER — Other Ambulatory Visit: Payer: Self-pay | Admitting: Adult Health

## 2019-08-31 DIAGNOSIS — G4761 Periodic limb movement disorder: Secondary | ICD-10-CM

## 2019-08-31 DIAGNOSIS — G2581 Restless legs syndrome: Secondary | ICD-10-CM

## 2019-08-31 DIAGNOSIS — D509 Iron deficiency anemia, unspecified: Secondary | ICD-10-CM

## 2019-08-31 DIAGNOSIS — G4701 Insomnia due to medical condition: Secondary | ICD-10-CM

## 2019-09-10 DIAGNOSIS — G4733 Obstructive sleep apnea (adult) (pediatric): Secondary | ICD-10-CM | POA: Diagnosis not present

## 2019-09-13 ENCOUNTER — Encounter: Payer: Self-pay | Admitting: Adult Health

## 2019-09-18 ENCOUNTER — Telehealth: Payer: Self-pay | Admitting: Obstetrics and Gynecology

## 2019-09-18 NOTE — Telephone Encounter (Signed)
Left message on voicemail to call and reschedule cancelled appointment. °

## 2019-09-28 MED ORDER — PRAMIPEXOLE DIHYDROCHLORIDE 0.125 MG PO TABS: 0.1250 mg | ORAL_TABLET | Freq: Every day | ORAL | 5 refills | Status: DC

## 2019-10-09 MED ORDER — PRAMIPEXOLE DIHYDROCHLORIDE 0.5 MG PO TABS: 0.5000 mg | ORAL_TABLET | Freq: Every day | ORAL | 5 refills | Status: DC

## 2019-10-09 NOTE — Addendum Note (Signed)
Addended by: Trudie Buckler on: 10/09/2019 03:56 PM   Modules accepted: Orders

## 2019-10-10 ENCOUNTER — Other Ambulatory Visit: Payer: Self-pay | Admitting: Adult Health

## 2019-10-10 DIAGNOSIS — G4701 Insomnia due to medical condition: Secondary | ICD-10-CM

## 2019-10-10 DIAGNOSIS — G2581 Restless legs syndrome: Secondary | ICD-10-CM

## 2019-10-10 DIAGNOSIS — D509 Iron deficiency anemia, unspecified: Secondary | ICD-10-CM

## 2019-10-11 DIAGNOSIS — G4733 Obstructive sleep apnea (adult) (pediatric): Secondary | ICD-10-CM | POA: Diagnosis not present

## 2019-10-16 ENCOUNTER — Ambulatory Visit: Payer: Medicare HMO | Admitting: Adult Health

## 2019-10-16 ENCOUNTER — Other Ambulatory Visit: Payer: Self-pay

## 2019-10-16 ENCOUNTER — Encounter: Payer: Self-pay | Admitting: Adult Health

## 2019-10-16 VITALS — BP 128/78 | HR 78 | Temp 96.7°F | Ht 66.5 in | Wt 231.0 lb

## 2019-10-16 DIAGNOSIS — G4733 Obstructive sleep apnea (adult) (pediatric): Secondary | ICD-10-CM | POA: Diagnosis not present

## 2019-10-16 DIAGNOSIS — Z9989 Dependence on other enabling machines and devices: Secondary | ICD-10-CM | POA: Diagnosis not present

## 2019-10-16 DIAGNOSIS — G2581 Restless legs syndrome: Secondary | ICD-10-CM

## 2019-10-16 MED ORDER — PRAMIPEXOLE DIHYDROCHLORIDE 0.5 MG PO TABS: ORAL_TABLET | ORAL | 5 refills | Status: DC

## 2019-10-16 NOTE — Progress Notes (Signed)
PATIENT: Amy Burnett DOB: May 11, 1951  REASON FOR VISIT: follow up HISTORY FROM: patient  HISTORY OF PRESENT ILLNESS: Today 10/16/19:  Amy Burnett is a 69 year old female with a history of obstructive sleep apnea on CPAP and restless leg syndrome.  She is currently on Mirapex 0.5 mg at bedtime.  She reports that this has been beneficial.  She states that when she gets to sleep she has not woken up with discomfort.  She states that she has noticed that after lunch she began having some discomfort if she sits down.  She still has Neupro patches that she will use if she has breakthrough symptoms.  She returns today for an evaluation.  HISTORY 07/19/19:  Amy Burnett is a 69 year old female with a history of obstructive sleep apnea on CPAP and restless leg syndrome.  She returns today for follow-up.  She reports that she stopped using the CPAP in July.  She states that she could not sleep longer than 4 hours within a month.  She states that since she stopped using it she feels that she sleeps longer.  She does note that she still snores.  She wakes up with a dry mouth.  She continues to use Provigil during the day for daytime sleepiness.  She reports the Neupro patch continues to control her restless legs.  She returns today for an evaluation.  REVIEW OF SYSTEMS: Out of a complete 14 system review of symptoms, the patient complains only of the following symptoms, and all other reviewed systems are negative.  See HPI  ALLERGIES: No Known Allergies  HOME MEDICATIONS: Outpatient Medications Prior to Visit  Medication Sig Dispense Refill  . albuterol (PROAIR HFA) 108 (90 Base) MCG/ACT inhaler Inhale into the lungs as needed.     . ALPRAZolam (XANAX) 0.5 MG tablet Take 0.5 mg by mouth as needed.     . hydrochlorothiazide (HYDRODIURIL) 25 MG tablet Take 25 mg by mouth every morning.  0  . modafinil (PROVIGIL) 200 MG tablet TAKE 1 TABLET BY MOUTH AT 7AM AND 1 TABLET AT 12 NOON, OR NO LATER THAN  2PM 60 tablet 5  . NEUPRO 6 MG/24HR APPLY 1 PATCH ONCE DAILY 30 patch 5  . potassium chloride (K-DUR) 10 MEQ tablet Take by mouth.    . sertraline (ZOLOFT) 100 MG tablet Take 200 mg by mouth every morning.     . pramipexole (MIRAPEX) 0.5 MG tablet Take 1 tablet (0.5 mg total) by mouth at bedtime. 30 tablet 5   No facility-administered medications prior to visit.    PAST MEDICAL HISTORY: Past Medical History:  Diagnosis Date  . Anxiety   . Excessive daytime sleepiness   . Heart murmur   . History of kidney stones   . History of panic attacks   . Hx of adenomatous polyp of colon 01/03/2017  . Hypertension   . OSA on CPAP   . Osteopenia, senile 2017   hip  . Rectal lipoma 2016  . RLS (restless legs syndrome)   . Sleep apnea     PAST SURGICAL HISTORY: Past Surgical History:  Procedure Laterality Date  . Achilles Tendinitis    . CARDIOVASCULAR STRESS TEST  09-19-2009   normal perfusion study/  ef 80%  . COLONOSCOPY  03/09/2006  . D & C HYSTEROSCOPY W/ RESECTION MYOMA  01-28-2009  . D & C HYSTEROSCOPY/  RESECTION MYOMA AND ENDOMETRIAL POLYP  09-23-2009  . EUS N/A 06/01/2013   Procedure: LOWER ENDOSCOPIC ULTRASOUND (EUS);  Surgeon:  Milus Banister, MD;  Location: [facility];  Service: Endoscopy;  Laterality: N/A;  . EUS N/A 12/06/2014   Procedure: LOWER ENDOSCOPIC ULTRASOUND (EUS);  Surgeon: Milus Banister, MD;  Location: [facility];  Service: Endoscopy;  Laterality: N/A;  . LEFT URETEROSCOPIC LASER LITHOTRIPSY STONE EXTRACTION/ STENT PLACEMENT  12-13-2001  . TONSILLECTOMY AND ADENOIDECTOMY  age 63  . TRANSANAL EXCISION OF RECTAL MASS N/A 04/11/2015   Procedure: TRANSANAL EXCISION OF DISTAL RECTAL MASS;  Surgeon: [name], MD;  Location: [place];  Service: General;  Laterality: N/A;  . TRANSTHORACIC ECHOCARDIOGRAM  09-16-2009   mild LVH,  ef 60-65%,  grade I diastolic dysfunction/  trivial MR and TR    FAMILY HISTORY: Family History  Problem  Relation Age of Onset  . Breast cancer Mother 52  . Pancreatic cancer Mother 79  . Other Sister        pituitary adenoma  . Colon cancer Neg Hx   . Esophageal cancer Neg Hx   . Rectal cancer Neg Hx   . Stomach cancer Neg Hx     SOCIAL HISTORY: Social History   Socioeconomic History  . Marital status: Married    Spouse name: [name]  . Number of children: 2  . Years of education: college  . Highest education level: Not on file  Occupational History    Employer: SELF-EMPLOYED    Comment: full time works for her self  Tobacco Use  . Smoking status: Former Smoker    Years: 15.00    Types: Cigarettes    Quit date: 06/15/1985    Years since quitting: 34.3  . Smokeless tobacco: Never Used  . Tobacco comment: Quit 32 years ago.  Substance and Sexual Activity  . Alcohol use: Yes    Alcohol/week: 6.0 - 8.0 standard drinks    Types: 6 - 8 Glasses of wine per week    Comment: with dinner  . Drug use: No  . Sexual activity: Not Currently    Partners: Male    Birth control/protection: Post-menopausal  Other Topics Concern  . Not on file  Social History Narrative   Patient lives at home with her husband [name])    Patient works full time   Education- college   Right handed   Caffeine- diet coke three daily   Social Determinants of Radio broadcast assistant Strain:   . Difficulty of Paying Living Expenses:   Food Insecurity:   . Worried About Charity fundraiser in the Last Year:   . Arboriculturist in the Last Year:   Transportation Needs:   . Film/video editor (Medical):   Marland Kitchen Lack of Transportation (Non-Medical):   Physical Activity:   . Days of Exercise per Week:   . Minutes of Exercise per Session:   Stress:   . Feeling of Stress :   Social Connections:   . Frequency of Communication with Friends and Family:   . Frequency of Social Gatherings with Friends and Family:   . Attends Religious Services:   . Active Member of Clubs or Organizations:   . Attends Theatre manager Meetings:   Marland Kitchen Marital Status:   Intimate Partner Violence:   . Fear of Current or Ex-Partner:   . Emotionally Abused:   Marland Kitchen Physically Abused:   . Sexually Abused:       PHYSICAL EXAM  Vitals:   10/16/19 1011  BP: 128/78  Pulse: 78  Temp: (!) 96.7 F (35.9  C)  Weight: 231 lb (104.8 kg)  Height: 5' 6.5" (1.689 m)   Body mass index is 36.73 kg/m.  Generalized: Well developed, in no acute distress   Neurological examination  Mentation: Alert oriented to time, place, history taking. Follows all commands speech and language fluent Cranial nerve II-XII: Pupils were equal round reactive to light. Extraocular movements were full, visual field were full on confrontational test.  Head turning and shoulder shrug  were normal and symmetric. Motor: The motor testing reveals 5 over 5 strength of all 4 extremities. Good symmetric motor tone is noted throughout.  Sensory: Sensory testing is intact to soft touch on all 4 extremities. No evidence of extinction is noted.  Coordination: Cerebellar testing reveals good finger-nose-finger and heel-to-shin bilaterally.  Gait and station: Gait is normal.  Reflexes: Deep tendon reflexes are symmetric and normal bilaterally.   DIAGNOSTIC DATA (LABS, IMAGING, TESTING) - I reviewed patient records, labs, notes, testing and imaging myself where available.  Lab Results  Component Value Date   WBC 7.0 09/23/2009   HGB 13.5 04/24/2015   HCT 42.3 09/23/2009   MCV 94.2 09/23/2009   PLT 167 09/23/2009      Component Value Date/Time   NA 142 09/18/2009 0807   K 4.3 09/18/2009 0807   CL 111 09/18/2009 0807   CO2 26 09/18/2009 0807   GLUCOSE 102 (H) 09/18/2009 0807   BUN 13 09/18/2009 0807   CREATININE 0.8 09/18/2009 0807   CALCIUM 9.1 09/18/2009 0807   PROT 6.8 09/18/2009 0807   ALBUMIN 4.1 09/18/2009 0807   AST 20 09/18/2009 0807   ALT 18 09/18/2009 0807   ALKPHOS 85 09/18/2009 0807   BILITOT 0.2 (L) 09/18/2009 0807   GFRNONAA  78.08 09/18/2009 0807   GFRAA 111 08/21/2008 0906   Lab Results  Component Value Date   CHOL 213 (H) 09/18/2009   HDL 70.40 09/18/2009   LDLDIRECT 138.8 09/18/2009   TRIG 72.0 09/18/2009   CHOLHDL 3 09/18/2009   No results found for: HGBA1C No results found for: VITAMINB12 Lab Results  Component Value Date   TSH 1.14 09/18/2009      ASSESSMENT AND PLAN 69 y.o. year old female  has a past medical history of Anxiety, Excessive daytime sleepiness, Heart murmur, History of kidney stones, History of panic attacks, adenomatous polyp of colon (01/03/2017), Hypertension, OSA on CPAP, Osteopenia, senile (2017), Rectal lipoma (2016), RLS (restless legs syndrome), and Sleep apnea. here with :  1.  Restless leg syndrome  -Increase Mirapex 0.25 mg mid afternoon and 0.5 mg at bedtime -If this is not beneficial she should let us know  2.  Obstructive sleep apnea on CPAP  -Continue using CPAP nightly and greater than 4 hours each night  Advised to follow-up in 6 months or sooner if needed   I spent 25 minutes of face-to-face and non-face-to-face time with patient.  This included previsit chart review, lab review, study review, order entry, electronic health record documentation, patient education.  Ward Givens, MSN, NP-C 10/16/2019, 11:03 AM [facility] [address] [place], [place] [address] [phone]

## 2019-10-16 NOTE — Patient Instructions (Signed)
Your Plan:  Increase Mirapex 1/2 tablet mid afternoon and 1 tablet at bedtime  If your symptoms worsen or you develop new symptoms please let us know.   Thank you for coming to see [place] at [facility]. I hope we have been able to provide you high quality care today.  You may receive a patient satisfaction survey over the next few weeks. We would appreciate your feedback and comments so that we may continue to improve ourselves and the health of our patients.

## 2019-10-24 DIAGNOSIS — H52203 Unspecified astigmatism, bilateral: Secondary | ICD-10-CM | POA: Diagnosis not present

## 2019-10-24 DIAGNOSIS — H2513 Age-related nuclear cataract, bilateral: Secondary | ICD-10-CM | POA: Diagnosis not present

## 2019-11-02 DIAGNOSIS — G4730 Sleep apnea, unspecified: Secondary | ICD-10-CM | POA: Diagnosis not present

## 2019-11-02 DIAGNOSIS — Z130 Encounter for screening for diseases of the blood and blood-forming organs and certain disorders involving the immune mechanism: Secondary | ICD-10-CM | POA: Diagnosis not present

## 2019-11-02 DIAGNOSIS — E782 Mixed hyperlipidemia: Secondary | ICD-10-CM | POA: Diagnosis not present

## 2019-11-02 DIAGNOSIS — R69 Illness, unspecified: Secondary | ICD-10-CM | POA: Diagnosis not present

## 2019-11-02 DIAGNOSIS — Z1329 Encounter for screening for other suspected endocrine disorder: Secondary | ICD-10-CM | POA: Diagnosis not present

## 2019-11-02 DIAGNOSIS — I10 Essential (primary) hypertension: Secondary | ICD-10-CM | POA: Diagnosis not present

## 2019-11-02 DIAGNOSIS — Z0000 Encounter for general adult medical examination without abnormal findings: Secondary | ICD-10-CM | POA: Diagnosis not present

## 2019-11-10 ENCOUNTER — Ambulatory Visit: Payer: Medicare HMO | Admitting: Obstetrics and Gynecology

## 2019-11-10 DIAGNOSIS — G4733 Obstructive sleep apnea (adult) (pediatric): Secondary | ICD-10-CM | POA: Diagnosis not present

## 2019-12-08 DIAGNOSIS — H2102 Hyphema, left eye: Secondary | ICD-10-CM | POA: Diagnosis not present

## 2019-12-09 DIAGNOSIS — H2102 Hyphema, left eye: Secondary | ICD-10-CM | POA: Diagnosis not present

## 2019-12-11 DIAGNOSIS — G4733 Obstructive sleep apnea (adult) (pediatric): Secondary | ICD-10-CM | POA: Diagnosis not present

## 2019-12-12 DIAGNOSIS — H2513 Age-related nuclear cataract, bilateral: Secondary | ICD-10-CM | POA: Diagnosis not present

## 2019-12-12 DIAGNOSIS — H2102 Hyphema, left eye: Secondary | ICD-10-CM | POA: Diagnosis not present

## 2019-12-14 DIAGNOSIS — H2513 Age-related nuclear cataract, bilateral: Secondary | ICD-10-CM | POA: Diagnosis not present

## 2019-12-14 DIAGNOSIS — H2102 Hyphema, left eye: Secondary | ICD-10-CM | POA: Diagnosis not present

## 2019-12-19 DIAGNOSIS — H2102 Hyphema, left eye: Secondary | ICD-10-CM | POA: Diagnosis not present

## 2020-01-10 DIAGNOSIS — G4733 Obstructive sleep apnea (adult) (pediatric): Secondary | ICD-10-CM | POA: Diagnosis not present

## 2020-01-23 DIAGNOSIS — H04123 Dry eye syndrome of bilateral lacrimal glands: Secondary | ICD-10-CM | POA: Diagnosis not present

## 2020-01-23 DIAGNOSIS — H2102 Hyphema, left eye: Secondary | ICD-10-CM | POA: Diagnosis not present

## 2020-02-07 DIAGNOSIS — H2102 Hyphema, left eye: Secondary | ICD-10-CM | POA: Diagnosis not present

## 2020-02-22 DIAGNOSIS — R69 Illness, unspecified: Secondary | ICD-10-CM | POA: Diagnosis not present

## 2020-02-23 ENCOUNTER — Other Ambulatory Visit: Payer: Self-pay | Admitting: Adult Health

## 2020-02-26 ENCOUNTER — Other Ambulatory Visit: Payer: Self-pay | Admitting: Internal Medicine

## 2020-02-26 DIAGNOSIS — Z1231 Encounter for screening mammogram for malignant neoplasm of breast: Secondary | ICD-10-CM

## 2020-02-28 DIAGNOSIS — R69 Illness, unspecified: Secondary | ICD-10-CM | POA: Diagnosis not present

## 2020-03-11 NOTE — Progress Notes (Signed)
69 y.o. [id number] Married Caucasian female here for annual exam. Patient states that "very shortly" after eating lunch, she has "the urgency to go to the bathroom and it has to be". Per patient, unsure if she has fecal incontinence.   Symptoms for over a year.   Takes IB Guard in the am, and this helps.  No blood in the stool and no painful bowel movements.   Currently declining a bone density.  States she would not take Fosamax.  Patient has had a physical with PCP May 2021.  Received her Covid booster and her flu vaccine.   Labs with PCP.   PCP: Joni Reining, PA-C  Patient's last menstrual period was 01/04/2012.           Sexually active: No.  The current method of family planning is post menopausal status.    Exercising: Yes.    walking Smoker:  Former  Health Maintenance: Pap:  11-04-18 Neg, 05-21-16 Neg, 04-24-15 Neg:Neg HR HPV History of abnormal Pap:  no MMG: 04-25-19  3D/Neg/density C/Birads1 Colonoscopy: 12-24-16 Polyp removed;next due in 2023.  BMD: 12-20-17  Result :Osteopenia TDaP: PCP Gardasil:   no HIV:Neg with PCP Hep C:Neg with PCP Screening Labs:  PCP; patient would like to discuss Vitamin D level   reports that she quit smoking about 34 years ago. Her smoking use included cigarettes. She quit after 15.00 years of use. She has never used smokeless tobacco. She reports current alcohol use of about 6.0 - 8.0 standard drinks of alcohol per week. She reports that she does not use drugs.  Past Medical History:  Diagnosis Date  . Anxiety   . Excessive daytime sleepiness   . Heart murmur   . History of kidney stones   . History of panic attacks   . Hx of adenomatous polyp of colon 01/03/2017  . Hypertension   . OSA on CPAP   . Osteopenia, senile 2017   hip  . Rectal lipoma 2016  . RLS (restless legs syndrome)   . Sleep apnea     Past Surgical History:  Procedure Laterality Date  . Achilles Tendinitis    . CARDIOVASCULAR STRESS TEST  09-19-2009    normal perfusion study/  ef 80%  . COLONOSCOPY  03/09/2006  . D & C HYSTEROSCOPY W/ RESECTION MYOMA  01-28-2009  . D & C HYSTEROSCOPY/  RESECTION MYOMA AND ENDOMETRIAL POLYP  09-23-2009  . EUS N/A 06/01/2013   Procedure: LOWER ENDOSCOPIC ULTRASOUND (EUS);  Surgeon: Milus Banister, MD;  Location: [facility];  Service: Endoscopy;  Laterality: N/A;  . EUS N/A 12/06/2014   Procedure: LOWER ENDOSCOPIC ULTRASOUND (EUS);  Surgeon: Milus Banister, MD;  Location: [facility];  Service: Endoscopy;  Laterality: N/A;  . LEFT URETEROSCOPIC LASER LITHOTRIPSY STONE EXTRACTION/ STENT PLACEMENT  12-13-2001  . TONSILLECTOMY AND ADENOIDECTOMY  age 25  . TRANSANAL EXCISION OF RECTAL MASS N/A 04/11/2015   Procedure: TRANSANAL EXCISION OF DISTAL RECTAL MASS;  Surgeon: [name], MD;  Location: [place];  Service: General;  Laterality: N/A;  . TRANSTHORACIC ECHOCARDIOGRAM  09-16-2009   mild LVH,  ef 60-65%,  grade I diastolic dysfunction/  trivial MR and TR    Current Outpatient Medications  Medication Sig Dispense Refill  . albuterol (PROAIR HFA) 108 (90 Base) MCG/ACT inhaler Inhale into the lungs as needed.     . ALPRAZolam (XANAX) 0.5 MG tablet Take 0.5 mg by mouth as needed.     . hydrochlorothiazide (HYDRODIURIL) 25  MG tablet Take 25 mg by mouth every morning.  0  . modafinil (PROVIGIL) 200 MG tablet TAKE 1 TABLET BY MOUTH AT 7AM AND 1 TABLET AT 12 NOON, OR NO LATER THAN 2PM 60 tablet 5  . pramipexole (MIRAPEX) 0.5 MG tablet TAKE 1/2 TABLET BY MOUTH MID AFTERNOON AND 1 TABLET AT BEDTIME 180 tablet 0  . sertraline (ZOLOFT) 100 MG tablet Take 200 mg by mouth every morning.      No current facility-administered medications for this visit.    Family History  Problem Relation Age of Onset  . Breast cancer Mother 33  . Pancreatic cancer Mother 82  . Other Sister        pituitary adenoma  . Colon cancer Neg Hx   . Esophageal cancer Neg Hx   . Rectal cancer Neg Hx   . Stomach  cancer Neg Hx     Review of Systems  Constitutional: Negative.   HENT: Negative.   Eyes: Negative.   Respiratory: Negative.   Cardiovascular: Negative.   Gastrointestinal: Negative.   Endocrine: Negative.   Genitourinary: Negative.   Musculoskeletal: Negative.   Skin: Negative.   Allergic/Immunologic: Negative.   Neurological: Negative.   Hematological: Negative.   Psychiatric/Behavioral: Negative.     Exam:   BP 118/80 (BP Location: Right Arm, Patient Position: Sitting, Cuff Size: Large)   Pulse 88   Resp 14   Ht 5' 4.75" (1.645 m)   Wt 233 lb (105.7 kg)   LMP 01/04/2012   BMI 39.07 kg/m     General appearance: alert, cooperative and appears stated age Head: normocephalic, without obvious abnormality, atraumatic Neck: no adenopathy, supple, symmetrical, trachea midline and thyroid normal to inspection and palpation Lungs: clear to auscultation bilaterally Breasts: normal appearance, no masses or tenderness, No nipple retraction or dimpling, No nipple discharge or bleeding, No axillary adenopathy Heart: regular rate and rhythm Abdomen: soft, non-tender; no masses, no organomegaly Extremities: extremities normal, atraumatic, no cyanosis or edema Skin: skin color, texture, turgor normal. No rashes or lesions Lymph nodes: cervical, supraclavicular, and axillary nodes normal. Neurologic: grossly normal  Pelvic: External genitalia:  no lesions              No abnormal inguinal nodes palpated.              Urethra:  normal appearing urethra with no masses, tenderness or lesions              Bartholins and Skenes: normal                 Vagina: normal appearing vagina with normal color and discharge, no lesions              Cervix: no lesions              Pap taken: No. Bimanual Exam:  Uterus:  normal size, contour, position, consistency, mobility, non-tender              Adnexa: no mass, fullness, tenderness              Rectal exam: Yes.  .  Confirms.              Anus:   normal sphincter tone, no lesions  Chaperone was present for exam.  Assessment:   Well woman visit with normal exam. Osteopenia.  Hx of breast and pancreatic cancer in mother.  Hx renal stones.  HTN. Fecal urgency.   Plan: Mammogram screening discussed. Self breast awareness  reviewed. Pap and HR HPV as above. Guidelines for Calcium, Vitamin D, regular exercise program including cardiovascular and weight bearing exercise. Try Metamucil.  If fecal urgency persists, she will return to her PCP.  She will consider a bone density this year.  We did discuss treatment options for bone building if needed. Follow up annually and prn.   After visit summary provided.

## 2020-03-12 ENCOUNTER — Ambulatory Visit (INDEPENDENT_AMBULATORY_CARE_PROVIDER_SITE_OTHER): Payer: Medicare HMO | Admitting: Obstetrics and Gynecology

## 2020-03-12 ENCOUNTER — Encounter: Payer: Self-pay | Admitting: Obstetrics and Gynecology

## 2020-03-12 ENCOUNTER — Other Ambulatory Visit: Payer: Self-pay

## 2020-03-12 VITALS — BP 118/80 | HR 88 | Resp 14 | Ht 64.75 in | Wt 233.0 lb

## 2020-03-12 DIAGNOSIS — Z780 Asymptomatic menopausal state: Secondary | ICD-10-CM | POA: Diagnosis not present

## 2020-03-12 DIAGNOSIS — Z01419 Encounter for gynecological examination (general) (routine) without abnormal findings: Secondary | ICD-10-CM

## 2020-03-12 NOTE — Patient Instructions (Signed)

## 2020-04-17 ENCOUNTER — Encounter: Payer: Self-pay | Admitting: Adult Health

## 2020-04-17 ENCOUNTER — Ambulatory Visit (INDEPENDENT_AMBULATORY_CARE_PROVIDER_SITE_OTHER): Payer: Medicare HMO | Admitting: Adult Health

## 2020-04-17 ENCOUNTER — Other Ambulatory Visit: Payer: Self-pay

## 2020-04-17 VITALS — BP 126/70 | Ht 64.75 in | Wt 234.0 lb

## 2020-04-17 DIAGNOSIS — Z9989 Dependence on other enabling machines and devices: Secondary | ICD-10-CM

## 2020-04-17 DIAGNOSIS — G4733 Obstructive sleep apnea (adult) (pediatric): Secondary | ICD-10-CM | POA: Diagnosis not present

## 2020-04-17 DIAGNOSIS — G4701 Insomnia due to medical condition: Secondary | ICD-10-CM

## 2020-04-17 DIAGNOSIS — D509 Iron deficiency anemia, unspecified: Secondary | ICD-10-CM

## 2020-04-17 DIAGNOSIS — G2581 Restless legs syndrome: Secondary | ICD-10-CM | POA: Diagnosis not present

## 2020-04-17 MED ORDER — MODAFINIL 200 MG PO TABS: ORAL_TABLET | ORAL | 5 refills | Status: DC

## 2020-04-17 MED ORDER — PRAMIPEXOLE DIHYDROCHLORIDE 0.5 MG PO TABS: ORAL_TABLET | ORAL | 1 refills | Status: DC

## 2020-04-17 NOTE — Addendum Note (Signed)
Addended by: Trudie Buckler on: 04/17/2020 03:12 PM   Modules accepted: Orders

## 2020-04-17 NOTE — Patient Instructions (Signed)
Continue using CPAP nightly and greater than 4 hours each night Continue mirapex for RLS Continue provigil  If your symptoms worsen or you develop new symptoms please let us know.

## 2020-04-17 NOTE — Progress Notes (Signed)
PATIENT: Amy Burnett DOB: December 03, 1950  REASON FOR VISIT: follow up HISTORY FROM: patient  HISTORY OF PRESENT ILLNESS: Today 04/17/20:  [name] is a 69 year old female with a history of obstructive sleep apnea on CPAP and restless leg syndrome.  She returns today for follow-up.  Her download indicates that she use her machine 26 out of 30 days for compliance of 87%.  She is averaging greater than 4 hours 19 days for compliance of 63%.  On average she uses her machine 4 hours and 32 minutes.  Her residual AHI is 4.5 on 8 cm of water with EPR of 3.  She does not have a significant leak.  Continues on Provigil and feel that is helpful for daytime sleepiness  She continues to use Mirapex for restless legs.  And feels that it works well for her.  HISTORY 10/16/19:  [name] is a 69 year old female with a history of obstructive sleep apnea on CPAP and restless leg syndrome.  She is currently on Mirapex 0.5 mg at bedtime.  She reports that this has been beneficial.  She states that when she gets to sleep she has not woken up with discomfort.  She states that she has noticed that after lunch she began having some discomfort if she sits down.  She still has Neupro patches that she will use if she has breakthrough symptoms.  She returns today for an evaluation.  REVIEW OF SYSTEMS: Out of a complete 14 system review of symptoms, the patient complains only of the following symptoms, and all other reviewed systems are negative.   ESS 10  ALLERGIES: No Known Allergies  HOME MEDICATIONS: Outpatient Medications Prior to Visit  Medication Sig Dispense Refill  . albuterol (PROAIR HFA) 108 (90 Base) MCG/ACT inhaler Inhale into the lungs as needed.     . ALPRAZolam (XANAX) 0.5 MG tablet Take 0.5 mg by mouth as needed.     . hydrochlorothiazide (HYDRODIURIL) 25 MG tablet Take 25 mg by mouth every morning.  0  . modafinil (PROVIGIL) 200 MG tablet TAKE 1 TABLET BY MOUTH AT 7AM AND 1 TABLET AT 12  NOON, OR NO LATER THAN 2PM 60 tablet 5  . pramipexole (MIRAPEX) 0.5 MG tablet TAKE 1/2 TABLET BY MOUTH MID AFTERNOON AND 1 TABLET AT BEDTIME 180 tablet 0  . sertraline (ZOLOFT) 100 MG tablet Take 200 mg by mouth every morning.      No facility-administered medications prior to visit.    PAST MEDICAL HISTORY: Past Medical History:  Diagnosis Date  . Anxiety   . Excessive daytime sleepiness   . Heart murmur   . History of kidney stones   . History of panic attacks   . Hx of adenomatous polyp of colon 01/03/2017  . Hypertension   . OSA on CPAP   . Osteopenia, senile 2017   hip  . Rectal lipoma 2016  . RLS (restless legs syndrome)   . Sleep apnea     PAST SURGICAL HISTORY: Past Surgical History:  Procedure Laterality Date  . Achilles Tendinitis    . CARDIOVASCULAR STRESS TEST  09-19-2009   normal perfusion study/  ef 80%  . COLONOSCOPY  03/09/2006  . D & C HYSTEROSCOPY W/ RESECTION MYOMA  01-28-2009  . D & C HYSTEROSCOPY/  RESECTION MYOMA AND ENDOMETRIAL POLYP  09-23-2009  . EUS N/A 06/01/2013   Procedure: LOWER ENDOSCOPIC ULTRASOUND (EUS);  Surgeon: Milus Banister, MD;  Location: [facility];  Service: Endoscopy;  Laterality: N/A;  .  EUS N/A 12/06/2014   Procedure: LOWER ENDOSCOPIC ULTRASOUND (EUS);  Surgeon: Milus Banister, MD;  Location: [facility];  Service: Endoscopy;  Laterality: N/A;  . LEFT URETEROSCOPIC LASER LITHOTRIPSY STONE EXTRACTION/ STENT PLACEMENT  12-13-2001  . TONSILLECTOMY AND ADENOIDECTOMY  age 2  . TRANSANAL EXCISION OF RECTAL MASS N/A 04/11/2015   Procedure: TRANSANAL EXCISION OF DISTAL RECTAL MASS;  Surgeon: [name], MD;  Location: [facility];  Service: General;  Laterality: N/A;  . TRANSTHORACIC ECHOCARDIOGRAM  09-16-2009   mild LVH,  ef 60-65%,  grade I diastolic dysfunction/  trivial MR and TR    FAMILY HISTORY: Family History  Problem Relation Age of Onset  . Breast cancer Mother 51  . Pancreatic cancer Mother 70  .  Other Sister        pituitary adenoma  . Colon cancer Neg Hx   . Esophageal cancer Neg Hx   . Rectal cancer Neg Hx   . Stomach cancer Neg Hx     SOCIAL HISTORY: Social History   Socioeconomic History  . Marital status: Married    Spouse name: Amy Burnett  . Number of children: 2  . Years of education: college  . Highest education level: Not on file  Occupational History    Employer: SELF-EMPLOYED    Comment: full time works for her self  Tobacco Use  . Smoking status: Former Smoker    Years: 15.00    Types: Cigarettes    Quit date: 06/15/1985    Years since quitting: 34.8  . Smokeless tobacco: Never Used  . Tobacco comment: Quit 32 years ago.  Vaping Use  . Vaping Use: Never used  Substance and Sexual Activity  . Alcohol use: Yes    Alcohol/week: 6.0 - 8.0 standard drinks    Types: 6 - 8 Glasses of wine per week    Comment: with dinner  . Drug use: No  . Sexual activity: Not Currently    Partners: Male    Birth control/protection: Post-menopausal  Other Topics Concern  . Not on file  Social History Narrative   Patient lives at home with her husband Amy Burnett)    Patient works full time   Education- college   Right handed   Caffeine- diet coke three daily   Social Determinants of Radio broadcast assistant Strain:   . Difficulty of Paying Living Expenses: Not on file  Food Insecurity:   . Worried About Charity fundraiser in the Last Year: Not on file  . Ran Out of Food in the Last Year: Not on file  Transportation Needs:   . Lack of Transportation (Medical): Not on file  . Lack of Transportation (Non-Medical): Not on file  Physical Activity:   . Days of Exercise per Week: Not on file  . Minutes of Exercise per Session: Not on file  Stress:   . Feeling of Stress : Not on file  Social Connections:   . Frequency of Communication with Friends and Family: Not on file  . Frequency of Social Gatherings with Friends and Family: Not on file  . Attends Religious Services:  Not on file  . Active Member of Clubs or Organizations: Not on file  . Attends Archivist Meetings: Not on file  . Marital Status: Not on file  Intimate Partner Violence:   . Fear of Current or Ex-Partner: Not on file  . Emotionally Abused: Not on file  . Physically Abused: Not on file  . Sexually  Abused: Not on file      PHYSICAL EXAM  Vitals:   04/17/20 1115  BP: 126/70  Weight: 234 lb (106.1 kg)  Height: 5' 4.75" (1.645 m)   Body mass index is 39.24 kg/m.  Generalized: Well developed, in no acute distress  Chest: Lungs clear to auscultation bilaterally  Neurological examination  Mentation: Alert oriented to time, place, history taking. Follows all commands speech and language fluent Cranial nerve II-XII: Extraocular movements were full, visual field were full on confrontational test Head turning and shoulder shrug  were normal and symmetric. Motor: The motor testing reveals 5 over 5 strength of all 4 extremities. Good symmetric motor tone is noted throughout.  Sensory: Sensory testing is intact to soft touch on all 4 extremities. No evidence of extinction is noted.  Gait and station: Gait is normal.    DIAGNOSTIC DATA (LABS, IMAGING, TESTING) - I reviewed patient records, labs, notes, testing and imaging myself where available.  Lab Results  Component Value Date   WBC 7.0 09/23/2009   HGB 13.5 04/24/2015   HCT 42.3 09/23/2009   MCV 94.2 09/23/2009   PLT 167 09/23/2009      Component Value Date/Time   NA 142 09/18/2009 0807   K 4.3 09/18/2009 0807   CL 111 09/18/2009 0807   CO2 26 09/18/2009 0807   GLUCOSE 102 (H) 09/18/2009 0807   BUN 13 09/18/2009 0807   CREATININE 0.8 09/18/2009 0807   CALCIUM 9.1 09/18/2009 0807   PROT 6.8 09/18/2009 0807   ALBUMIN 4.1 09/18/2009 0807   AST 20 09/18/2009 0807   ALT 18 09/18/2009 0807   ALKPHOS 85 09/18/2009 0807   BILITOT 0.2 (L) 09/18/2009 0807   GFRNONAA 78.08 09/18/2009 0807   GFRAA 111 08/21/2008 0906    Lab Results  Component Value Date   CHOL 213 (H) 09/18/2009   HDL 70.40 09/18/2009   LDLDIRECT 138.8 09/18/2009   TRIG 72.0 09/18/2009   CHOLHDL 3 09/18/2009    Lab Results  Component Value Date   TSH 1.14 09/18/2009      ASSESSMENT AND PLAN 69 y.o. year old female  has a past medical history of Anxiety, Excessive daytime sleepiness, Heart murmur, History of kidney stones, History of panic attacks, adenomatous polyp of colon (01/03/2017), Hypertension, OSA on CPAP, Osteopenia, senile (2017), Rectal lipoma (2016), RLS (restless legs syndrome), and Sleep apnea. here with:  1. OSA on CPAP  - CPAP compliance excellent - Good treatment of AHI  - Encourage patient to use CPAP nightly and > 4 hours each night - F/U in 1 year or sooner if needed   I spent 25 minutes of face-to-face and non-face-to-face time with patient.  This included previsit chart review, lab review, study review, order entry, electronic health record documentation, patient education.  Ward Givens, MSN, NP-C 04/17/2020, 11:29 AM [facility] [address], [place], [place] [address] [phone]

## 2020-04-29 ENCOUNTER — Other Ambulatory Visit: Payer: Self-pay | Admitting: Physician Assistant

## 2020-04-29 DIAGNOSIS — R1311 Dysphagia, oral phase: Secondary | ICD-10-CM | POA: Diagnosis not present

## 2020-04-29 DIAGNOSIS — H9202 Otalgia, left ear: Secondary | ICD-10-CM | POA: Diagnosis not present

## 2020-04-29 DIAGNOSIS — M2669 Other specified disorders of temporomandibular joint: Secondary | ICD-10-CM | POA: Diagnosis not present

## 2020-05-07 ENCOUNTER — Ambulatory Visit
Admission: RE | Admit: 2020-05-07 | Discharge: 2020-05-07 | Disposition: A | Discharge status: Home / Self Care | Payer: Medicare HMO | Source: Ambulatory Visit | Attending: Physician Assistant | Admitting: Physician Assistant

## 2020-05-07 DIAGNOSIS — K225 Diverticulum of esophagus, acquired: Secondary | ICD-10-CM | POA: Diagnosis not present

## 2020-05-07 DIAGNOSIS — R1311 Dysphagia, oral phase: Secondary | ICD-10-CM

## 2020-05-07 DIAGNOSIS — K449 Diaphragmatic hernia without obstruction or gangrene: Secondary | ICD-10-CM | POA: Diagnosis not present

## 2020-05-15 DIAGNOSIS — Z20822 Contact with and (suspected) exposure to covid-19: Secondary | ICD-10-CM | POA: Diagnosis not present

## 2020-05-15 DIAGNOSIS — Z03818 Encounter for observation for suspected exposure to other biological agents ruled out: Secondary | ICD-10-CM | POA: Diagnosis not present

## 2020-06-03 DIAGNOSIS — R159 Full incontinence of feces: Secondary | ICD-10-CM | POA: Diagnosis not present

## 2020-06-03 DIAGNOSIS — E782 Mixed hyperlipidemia: Secondary | ICD-10-CM | POA: Diagnosis not present

## 2020-06-03 DIAGNOSIS — I10 Essential (primary) hypertension: Secondary | ICD-10-CM | POA: Diagnosis not present

## 2020-07-04 ENCOUNTER — Telehealth: Payer: Self-pay | Admitting: Neurology

## 2020-07-04 NOTE — Telephone Encounter (Signed)
PA submitted through cover my meds/[employer]. KEY: [id number] Will await response from insurance

## 2020-07-08 NOTE — Telephone Encounter (Signed)
PA was approved-This approval authorizes your coverage from 06/15/2020 - 06/14/2021

## 2020-07-15 ENCOUNTER — Other Ambulatory Visit: Payer: Self-pay

## 2020-07-15 ENCOUNTER — Ambulatory Visit
Admission: RE | Admit: 2020-07-15 | Discharge: 2020-07-15 | Disposition: A | Discharge status: Home / Self Care | Payer: Medicare HMO | Source: Ambulatory Visit | Attending: Internal Medicine | Admitting: Internal Medicine

## 2020-07-15 DIAGNOSIS — Z1231 Encounter for screening mammogram for malignant neoplasm of breast: Secondary | ICD-10-CM

## 2020-07-18 ENCOUNTER — Other Ambulatory Visit: Payer: Self-pay

## 2020-07-18 ENCOUNTER — Ambulatory Visit: Payer: Medicare HMO | Admitting: Internal Medicine

## 2020-07-18 ENCOUNTER — Encounter: Payer: Self-pay | Admitting: Internal Medicine

## 2020-07-18 VITALS — BP 136/88 | HR 88 | Ht 64.25 in | Wt 241.0 lb

## 2020-07-18 DIAGNOSIS — R159 Full incontinence of feces: Secondary | ICD-10-CM

## 2020-07-18 DIAGNOSIS — Z86010 Personal history of colonic polyps: Secondary | ICD-10-CM | POA: Diagnosis not present

## 2020-07-18 DIAGNOSIS — R152 Fecal urgency: Secondary | ICD-10-CM

## 2020-07-18 DIAGNOSIS — N393 Stress incontinence (female) (male): Secondary | ICD-10-CM | POA: Diagnosis not present

## 2020-07-18 NOTE — Patient Instructions (Signed)
We have put in a referral for pelvic floor physical therapy. They will contact you about setting up an appointment.  Please continue to use your IBgard. Take 2 capsules before going out for meals.  Per Dr Carlean Purl we are changing your colonoscopy recall to 2025.    I appreciate the opportunity to care for you. Silvano Rusk, MD, [facility]

## 2020-07-18 NOTE — Progress Notes (Signed)
Amy Burnett 70 y.o. [birth year] [id number] Referred by: Amy Noon, MD  Assessment & Plan:   Encounter Diagnoses  Name Primary?  . Incontinence of feces with fecal urgency Yes  . SUI (stress urinary incontinence, female)   . Hx of adenomatous polyp of colon    Continue IBgard diet modification.  Refer to pelvic floor physical therapy as she has a week voluntary anal sphincter squeeze.  Change colonoscopy recall 08/05/2023 given new guidelines starting as early as 7 years after diminutive adenoma and up to 10 years.  I appreciate the opportunity to care for you. Amy Mayer, MD, Amy Burnett   Subjective:   Chief Complaint: Fecal incontinence  HPI Amy Burnett is a 70 year old woman with morbid obesity, restless leg syndrome and sleep apnea, hypertension, and a diminutive adenomatous colon polyp in 2018 here with complaints of fecal urgency and incontinence.  She has episodic urgent defecation and cannot reach the toilet in time and has incontinence of feces.  She has noted that IBgard is helpful.  She has some stress urinary incontinence but not major urinary incontinence symptoms.  She does wonder if she leaks urine at night because she thinks she can smell urine when she awakens though she does not wet herself.  Large meals greasy foods meat will trigger these events and eating smaller amounts definitely helps.  She has had a problem like this 20 years ago but it went away and now it has recurred in the last 8 months or so.  It is quite a problem with her lifestyle.  She never gets constipated.  Typically stools are Bristol stool scale 4 and on the days when she has the urgent episodes they are Bristol stool scale 6.   No Known Allergies Current Meds  Medication Sig  . albuterol (VENTOLIN HFA) 108 (90 Base) MCG/ACT inhaler Inhale into the lungs as needed.   . ALPRAZolam (XANAX) 0.5 MG tablet Take 0.5 mg by mouth as needed.   . hydrochlorothiazide (HYDRODIURIL) 25 MG tablet Take  25 mg by mouth every morning.  . modafinil (PROVIGIL) 200 MG tablet TAKE 1 TABLET BY MOUTH AT 7AM AND 1 TABLET AT 12 Burnett, OR NO LATER THAN 2PM  . pramipexole (MIRAPEX) 0.5 MG tablet TAKE 1/2 TABLET BY MOUTH MID AFTERNOON AND 1 TABLET AT BEDTIME  . sertraline (ZOLOFT) 100 MG tablet Take 200 mg by mouth every morning.    Past Medical History:  Diagnosis Date  . Anxiety   . Excessive daytime sleepiness   . Heart murmur   . History of kidney stones   . History of panic attacks   . Hx of adenomatous polyp of colon 01/03/2017  . Hypertension   . OSA on CPAP   . Osteopenia, senile 2017   hip  . Rectal lipoma 2016  . RLS (restless legs syndrome)   . Sleep apnea    Past Surgical History:  Procedure Laterality Date  . Achilles Tendinitis    . CARDIOVASCULAR STRESS TEST  09-19-2009   normal perfusion study/  ef 80%  . COLONOSCOPY  03/09/2006  . D & C HYSTEROSCOPY W/ RESECTION MYOMA  01-28-2009  . D & C HYSTEROSCOPY/  RESECTION MYOMA AND ENDOMETRIAL POLYP  09-23-2009  . EUS N/A 06/01/2013   Procedure: LOWER ENDOSCOPIC ULTRASOUND (EUS);  Surgeon: Milus Banister, MD;  Location: [facility];  Service: Endoscopy;  Laterality: N/A;  . EUS N/A 12/06/2014   Procedure: LOWER ENDOSCOPIC ULTRASOUND (EUS);  Surgeon: Milus Banister, MD;  Location: [facility];  Service: Endoscopy;  Laterality: N/A;  . LEFT URETEROSCOPIC LASER LITHOTRIPSY STONE EXTRACTION/ STENT PLACEMENT  12-13-2001  . TONSILLECTOMY AND ADENOIDECTOMY  age 35  . TRANSANAL EXCISION OF RECTAL MASS N/A 04/11/2015   Procedure: TRANSANAL EXCISION OF DISTAL RECTAL MASS;  Surgeon: [name], MD;  Location: [facility];  Service: General;  Laterality: N/A;  . TRANSTHORACIC ECHOCARDIOGRAM  09-16-2009   mild LVH,  ef 60-65%,  grade I diastolic dysfunction/  trivial MR and TR   Social History   Social History Narrative   Patient lives at home with her husband Amy Burnett)    Patient works full time   Education- college    Right handed   Caffeine- diet coke three to 4 daily   1 son 1 daughter   Former smoker   1 alcoholic beverage daily   family history includes Breast cancer (age of onset: 55) in her mother; Heart disease in her father; Other in her sister; Pancreatic cancer (age of onset: 69) in her mother.   Review of Systems Otherwise negative   Objective:   Physical Exam BP 136/88 (BP Location: Left Arm, Patient Position: Sitting, Cuff Size: Normal)   Pulse 88   Ht 5' 4.25" (1.632 m) Comment: height measured without shoes  Wt 241 lb (109.3 kg)   LMP 01/04/2012   BMI 41.05 kg/m  Obese ww NAD Lungs cta Cor Nl S1-S2 no rubs or gallops abd obese NT without hepatosplenomegaly or mass bowel sounds present  Amy Burnett, CMA present.   Anoderm inspection revealed small ant tag Anal wink was absent Digital exam revealed normal resting tone and markedly decreasedvoluntary squeeze. No mass or rectocele present. Simulated defecation with valsalva revealed appropriate abdominal contraction and descent.

## 2020-09-09 ENCOUNTER — Encounter: Payer: Self-pay | Admitting: Physical Therapy

## 2020-09-09 ENCOUNTER — Other Ambulatory Visit: Payer: Self-pay

## 2020-09-09 ENCOUNTER — Ambulatory Visit: Payer: Medicare HMO | Attending: Internal Medicine | Admitting: Physical Therapy

## 2020-09-09 DIAGNOSIS — M6281 Muscle weakness (generalized): Secondary | ICD-10-CM | POA: Diagnosis present

## 2020-09-09 DIAGNOSIS — R293 Abnormal posture: Secondary | ICD-10-CM | POA: Diagnosis present

## 2020-09-09 NOTE — Patient Instructions (Signed)
Access Code: [email or web address] URL: [email or web address] Date: 09/09/2020 Prepared by: Jari Favre  Exercises Supine Pelvic Floor Stretch - 1 x daily - 7 x weekly - 1 sets - 3 reps - 30 sec hold Supine Figure 4 Piriformis Stretch - 1 x daily - 7 x weekly - 1 sets - 3 reps - 30 sec hold Standing Low Back Flexion at Table - 3 x daily - 7 x weekly - 1 sets - 8 reps

## 2020-09-10 NOTE — Therapy (Signed)
[facility] [address], [place], [place], [address] Phone: [phone]   Fax:  [phone]  Physical Therapy Evaluation  Patient Details  Name: Amy Burnett MRN: [id number] Date of Birth: [birth year] Referring Provider (PT): Gatha Mayer, MD   Encounter Date: 09/09/2020   PT End of Session - 09/09/20 0851    Visit Number 1    Date for PT Re-Evaluation 12/02/20    Authorization Type aetna medicare    PT Start Time 0845    PT Stop Time 0924    PT Time Calculation (min) 39 min    Activity Tolerance Patient tolerated treatment well    Behavior During Therapy [facility] for tasks assessed/performed           Past Medical History:  Diagnosis Date  . Anxiety   . Excessive daytime sleepiness   . Heart murmur   . History of kidney stones   . History of panic attacks   . Hx of adenomatous polyp of colon 01/03/2017  . Hypertension   . OSA on CPAP   . Osteopenia, senile 2017   hip  . Rectal lipoma 2016  . RLS (restless legs syndrome)   . Sleep apnea     Past Surgical History:  Procedure Laterality Date  . Achilles Tendinitis    . CARDIOVASCULAR STRESS TEST  09-19-2009   normal perfusion study/  ef 80%  . COLONOSCOPY  03/09/2006  . D & C HYSTEROSCOPY W/ RESECTION MYOMA  01-28-2009  . D & C HYSTEROSCOPY/  RESECTION MYOMA AND ENDOMETRIAL POLYP  09-23-2009  . EUS N/A 06/01/2013   Procedure: LOWER ENDOSCOPIC ULTRASOUND (EUS);  Surgeon: Milus Banister, MD;  Location: [facility];  Service: Endoscopy;  Laterality: N/A;  . EUS N/A 12/06/2014   Procedure: LOWER ENDOSCOPIC ULTRASOUND (EUS);  Surgeon: Milus Banister, MD;  Location: [facility];  Service: Endoscopy;  Laterality: N/A;  . LEFT URETEROSCOPIC LASER LITHOTRIPSY STONE EXTRACTION/ STENT PLACEMENT  12-13-2001  . TONSILLECTOMY AND ADENOIDECTOMY  age 59  . TRANSANAL EXCISION OF RECTAL MASS N/A 04/11/2015   Procedure: TRANSANAL EXCISION OF DISTAL RECTAL MASS;   Surgeon: [name], MD;  Location: [facility];  Service: General;  Laterality: N/A;  . TRANSTHORACIC ECHOCARDIOGRAM  09-16-2009   mild LVH,  ef 60-65%,  grade I diastolic dysfunction/  trivial MR and TR    There were no vitals filed for this visit.    Subjective Assessment - 09/09/20 0847    Subjective Pt states she has a big problem every few weeks, but has smearing every day.  Urinary leakge happens just a little when coughing or sneezing.  Pt states the issue is more in the morning or right after lunch.    Limitations Other (comment)   community activities   Patient Stated Goals fecal leakage and smearing to go away    Currently in Pain? No/denies              [facility] PT Assessment - 09/10/20 0001      Assessment   Medical Diagnosis R15.9,R15.2 (ICD-10-CM) - Incontinence of feces with fecal urgency; N39.3 (ICD-10-CM) - SUI (stress urinary incontinence, female)    Referring Provider (PT) Gatha Mayer, MD      Precautions   Precautions None      Balance Screen   Has the patient fallen in the past 6 months No      [place] residence  Living Arrangements Spouse/significant other;Children   daughter     Prior Function   Level of Independence Independent    Vocation Retired    Leisure Programmer, applications; play grandchildren      Cognition   Overall Cognitive Status Within Functional Limits for tasks assessed      Functional Tests   Functional tests Single leg stance      Single Leg Stance   Comments trendelenburg bilat      Posture/Postural Control   Posture/Postural Control Postural limitations    Postural Limitations Increased lumbar lordosis;Rounded Shoulders      ROM / Strength   AROM / PROM / Strength AROM;PROM;Strength      AROM   Overall AROM Comments lumbar flexion 40%      PROM   Overall PROM Comments hip ER 50%      Strength   Overall Strength Comments hip adductors and core 4/5       Palpation   Palpation comment lumbar paraspinals tight      Ambulation/Gait   Gait Pattern Within Functional Limits                      Objective measurements completed on examination: See above findings.     Pelvic Floor Special Questions - 09/10/20 0001    Number of Pregnancies 2    Number of Vaginal Deliveries 2    Urinary Leakage Yes    How often fecal incontinence every day smearing and leak every few weeks    Pad use when going out    Pelvic Floor Internal Exam pt identity confirmed and informed consent given to treat    Exam Type Rectal    Strength weak squeeze, no lift    Strength # of reps 4   quick   Strength # of seconds 4    Tone normal                    PT Education - 09/09/20 0919    Education Details Access Code: [email or web address]    Person(s) Educated Patient    Methods Explanation;Demonstration;Tactile cues;Verbal cues;Handout    Comprehension Verbalized understanding;Returned demonstration            PT Short Term Goals - 09/10/20 1116      PT SHORT TERM GOAL #1   Title ind with initial HEP    Time 4    Period Weeks    Status New    Target Date 10/08/20             PT Long Term Goals - 09/09/20 0852      PT LONG TERM GOAL #1   Title Pt will be able to go out without wearing a pad due to improved bowel control    Time 12    Period Weeks    Status New    Target Date 12/02/20      PT LONG TERM GOAL #2   Title Pt will be ind with advanced HEP for improved core strength during functional activities    Time 12    Period Weeks    Status New    Target Date 12/02/20      PT LONG TERM GOAL #3   Title Pt will reportfecal smearing 75% less frequency    Time 12    Period Weeks    Status New    Target Date 12/02/20  Plan - 09/10/20 1113    Clinical Impression Statement Pt presents to skilled PT due to fecal incontinence.  Pt has history of distal rectal mass removal. States leakage started over the  last year and has smearing every day.  Pt has pelvic floor weakness as noted from pelvic floor assessment done rectally today.  Pt has 2/5 strength and can sustain for 4 seconds.  Pt is able to do 4 quick flicks in 4 seconds.  Pt has other impairments as noted above including core and adductor weakness.  Pt has tension in lumbar paraspinals and decreased lumbar flexion.  Pt will benefit from skilled PT to address these impairments in order to improve function with decreased leakage.    Examination-Activity Limitations Toileting;Continence    Examination-Participation Restrictions Community Activity    Stability/Clinical Decision Making Evolving/Moderate complexity    Clinical Decision Making Low    Rehab Potential Excellent    PT Frequency 1x / week    PT Duration 12 weeks    PT Treatment/Interventions ADLs/Self Care Home Management;Biofeedback;Cryotherapy;Electrical Stimulation;Moist Heat;Therapeutic activities;Therapeutic exercise;Neuromuscular re-education;Patient/family education;Manual techniques;Passive range of motion;Dry needling;Taping    PT Next Visit Plan lumbar and hip stretches, review initial HEP, basic core and pelvic floor strengthening    PT Home Exercise Plan Access Code: [email or web address]    Consulted and Agree with Plan of Care Patient           Patient will benefit from skilled therapeutic intervention in order to improve the following deficits and impairments:  Decreased endurance,Decreased range of motion,Decreased strength,Decreased coordination  Visit Diagnosis: Muscle weakness (generalized)  Abnormal posture     Problem List Patient Active Problem List   Diagnosis Date Noted  . Hx of adenomatous polyp of colon 01/03/2017  . Excessive daytime sleepiness 10/24/2015  . Hypersomnia with sleep apnea 10/24/2015  . Essential (primary) hypertension 08/06/2015  . Combined fat and carbohydrate induced hyperlipemia 08/06/2015  . RLS (restless legs syndrome) 07/18/2014  .  Hypersomnia, persistent 07/18/2014  . OSA on CPAP 07/18/2014  . Severe obesity (BMI >= 40) ([place]) 07/18/2014  . Apnea, sleep 04/16/2014  . Submucosal rectal nodule 04/10/2013  . Fibroid 03/17/2012  . Anxiety 03/03/2012  . HOARSENESS 12/20/2009  . MITRAL VALVE PROLAPSE, HX OF 08/30/2009  . H/O cardiovascular disorder 08/30/2009  . OBESITY, UNSPECIFIED 08/28/2008  . RESTLESS LEG SYNDROME 08/28/2008  . NEPHROLITHIASIS, HX OF 08/28/2008    Jule Ser, PT 09/10/2020, 12:49 PM  [facility] [address] [place], [place], [address] Phone: [phone]   Fax:  [phone]  Name: Amy Burnett MRN: [id number] Date of Birth: 03-30-1951

## 2020-10-10 ENCOUNTER — Ambulatory Visit: Payer: Medicare HMO | Attending: Internal Medicine | Admitting: Physical Therapy

## 2020-10-10 ENCOUNTER — Other Ambulatory Visit: Payer: Self-pay

## 2020-10-10 ENCOUNTER — Encounter: Payer: Self-pay | Admitting: Physical Therapy

## 2020-10-10 DIAGNOSIS — R293 Abnormal posture: Secondary | ICD-10-CM | POA: Insufficient documentation

## 2020-10-10 DIAGNOSIS — M6281 Muscle weakness (generalized): Secondary | ICD-10-CM | POA: Diagnosis present

## 2020-10-10 NOTE — Therapy (Addendum)
[facility] [address] [place], [place], [address] Phone: [phone]   Fax:  [phone]  Physical Therapy Treatment  Patient Details  Name: AUBREYANA SALTZ MRN: [id number] Date of Birth: Nov 08, 1950 Referring Provider (PT): Gatha Mayer, MD   Encounter Date: 10/10/2020   PT End of Session - 10/10/20 1455     Visit Number 2    Date for PT Re-Evaluation 12/02/20    Authorization Type aetna medicare    PT Start Time 1233    PT Stop Time 1313    PT Time Calculation (min) 40 min    Activity Tolerance Patient tolerated treatment well    Behavior During Therapy [facility] for tasks assessed/performed             Past Medical History:  Diagnosis Date   Anxiety    Excessive daytime sleepiness    Heart murmur    History of kidney stones    History of panic attacks    Hx of adenomatous polyp of colon 01/03/2017   Hypertension    OSA on CPAP    Osteopenia, senile 2017   hip   Rectal lipoma 2016   RLS (restless legs syndrome)    Sleep apnea     Past Surgical History:  Procedure Laterality Date   Achilles Tendinitis     CARDIOVASCULAR STRESS TEST  09-19-2009   normal perfusion study/  ef 80%   COLONOSCOPY  03/09/2006   D & C HYSTEROSCOPY W/ RESECTION MYOMA  01-28-2009   D & C HYSTEROSCOPY/  RESECTION MYOMA AND ENDOMETRIAL POLYP  09-23-2009   EUS N/A 06/01/2013   Procedure: LOWER ENDOSCOPIC ULTRASOUND (EUS);  Surgeon: Milus Banister, MD;  Location: [facility];  Service: Endoscopy;  Laterality: N/A;   EUS N/A 12/06/2014   Procedure: LOWER ENDOSCOPIC ULTRASOUND (EUS);  Surgeon: Milus Banister, MD;  Location: [facility];  Service: Endoscopy;  Laterality: N/A;   LEFT URETEROSCOPIC LASER LITHOTRIPSY STONE EXTRACTION/ STENT PLACEMENT  12-13-2001   TONSILLECTOMY AND ADENOIDECTOMY  age 15   TRANSANAL EXCISION OF RECTAL MASS N/A 04/11/2015   Procedure: TRANSANAL EXCISION OF DISTAL RECTAL MASS;  Surgeon: [name], MD;  Location: [facility];  Service: General;  Laterality: N/A;   TRANSTHORACIC ECHOCARDIOGRAM  09-16-2009   mild LVH,  ef 60-65%,  grade I diastolic dysfunction/  trivial MR and TR    There were no vitals filed for this visit.   Subjective Assessment - 10/10/20 1252     Subjective Pt states she did the exercises.  So far, no major leakage, but I have been at home.    Currently in Pain? No/denies                               [facility] Adult PT Treatment/Exercise - 10/10/20 0001       Neuro Re-ed    Neuro Re-ed Details  breathing and relax with stretches, exhale on exertion and ensure not holding breath or co-contracting too much with glutes during ex's      Exercises   Exercises Lumbar      Lumbar Exercises: Stretches   Active Hamstring Stretch Right;Left;3 reps;20 seconds    Single Knee to Chest Stretch Right;Left;3 reps;10 seconds    Lower Trunk Rotation 5 reps;10 seconds    Other Lumbar Stretch Exercise hip IR/ER      Lumbar Exercises: Standing   Functional Squats  10 reps   kegel and cues for good technique     Lumbar Exercises: Supine   Clam 15 reps   band red   Bent Knee Raise 20 reps    Other Supine Lumbar Exercises ball squeeze - 3 sec hold 3 rest - 15x                    PT Education - 10/10/20 1454     Education Details Access Code: [email or web address]    Person(s) Educated Patient    Methods Explanation;Demonstration;Tactile cues;Verbal cues;Handout    Comprehension Verbalized understanding;Returned demonstration              PT Short Term Goals - 10/10/20 1255       PT SHORT TERM GOAL #1   Title ind with initial HEP    Status Achieved               PT Long Term Goals - 09/09/20 9528       PT LONG TERM GOAL #1   Title Pt will be able to go out without wearing a pad due to improved bowel control    Time 12    Period Weeks    Status New    Target Date 12/02/20      PT LONG TERM GOAL #2   Title  Pt will be ind with advanced HEP for improved core strength during functional activities    Time 12    Period Weeks    Status New    Target Date 12/02/20      PT LONG TERM GOAL #3   Title Pt will reportfecal smearing 75% less frequency    Time 12    Period Weeks    Status New    Target Date 12/02/20                   Plan - 10/10/20 1455     Clinical Impression Statement Pt did well with exercises today.  She felt fatigued after sets of 8-10 reps of each one.  Pt monitored for reduced use of accessory muscles.  pt needed the most cues with reduction of glutes.  Pt did well with intermittent stretching in order to reduce muscle fatigue.  pt will benefit from skilled PT to work towards functional goals.  No goals met today due to first follow up from eval.    PT Treatment/Interventions ADLs/Self Care Home Management;Biofeedback;Cryotherapy;Electrical Stimulation;Moist Heat;Therapeutic activities;Therapeutic exercise;Neuromuscular re-education;Patient/family education;Manual techniques;Passive range of motion;Dry needling;Taping    PT Next Visit Plan f/u on new ex's in HEP, progress squat and endurance as able, exhale on exertion and knack    PT Home Exercise Plan Access Code: [email or web address]    Consulted and Agree with Plan of Care Patient             Patient will benefit from skilled therapeutic intervention in order to improve the following deficits and impairments:  Decreased endurance,Decreased range of motion,Decreased strength,Decreased coordination  Visit Diagnosis: Muscle weakness (generalized)  Abnormal posture     Problem List Patient Active Problem List   Diagnosis Date Noted   Hx of adenomatous polyp of colon 01/03/2017   Excessive daytime sleepiness 10/24/2015   Hypersomnia with sleep apnea 10/24/2015   Essential (primary) hypertension 08/06/2015   Combined fat and carbohydrate induced hyperlipemia 08/06/2015   RLS (restless legs syndrome) 07/18/2014    Hypersomnia, persistent 07/18/2014   OSA on CPAP 07/18/2014   Severe obesity (BMI >= 40) (HCC)  07/18/2014   Apnea, sleep 04/16/2014   Submucosal rectal nodule 04/10/2013   Fibroid 03/17/2012   Anxiety 03/03/2012   HOARSENESS 12/20/2009   MITRAL VALVE PROLAPSE, HX OF 08/30/2009   H/O cardiovascular disorder 08/30/2009   OBESITY, UNSPECIFIED 08/28/2008   RESTLESS LEG SYNDROME 08/28/2008   NEPHROLITHIASIS, HX OF 08/28/2008    Jule Ser, PT 10/10/2020, 3:01 PM  [facility] [address] [place], [place], [address] Phone: [phone]   Fax:  [phone]  Name: ALYSSIA HEESE MRN: [id number] Date of Birth: 09-10-1950  PHYSICAL THERAPY DISCHARGE SUMMARY  Visits from Start of Care: 2  Current functional level related to goals / functional outcomes: See above goals   Remaining deficits: See above   Education / Equipment: HEP  Patient agrees to discharge. Patient goals were not met. Patient is being discharged due to not returning since the last visit.  Gustavus Bryant, PT 01/13/21 11:36 AM

## 2020-10-10 NOTE — Patient Instructions (Signed)
Access Code: [email or web address] URL: [email or web address] Date: 10/10/2020 Prepared by: Jari Favre  Exercises Supine Pelvic Floor Stretch - 1 x daily - 7 x weekly - 1 sets - 3 reps - 30 sec hold Supine Figure 4 Piriformis Stretch - 1 x daily - 7 x weekly - 1 sets - 3 reps - 30 sec hold Supine Lower Trunk Rotation - 1 x daily - 7 x weekly - 10 reps - 1 sets - 5 sec hold Standing Hamstring Stretch with Step - 1 x daily - 7 x weekly - 3 reps - 1 sets - 30 sec hold Standing Low Back Flexion at Table - 1 x daily - 7 x weekly - 1 sets - 10 reps Mini Squat with Pelvic Floor Contraction - 1 x daily - 7 x weekly - 1 sets - 10 reps Supine Hip Adductor Squeeze with Small Ball - 3 x daily - 7 x weekly - 1 sets - 10 reps - 3 sec hold Hooklying Clamshells with Resistance - 1 x daily - 7 x weekly - 3 sets - 10 reps

## 2020-10-14 ENCOUNTER — Other Ambulatory Visit: Payer: Self-pay | Admitting: Adult Health

## 2020-10-14 ENCOUNTER — Ambulatory Visit: Payer: Medicare HMO | Admitting: Physical Therapy

## 2020-10-24 ENCOUNTER — Ambulatory Visit: Payer: Medicare HMO | Admitting: Physical Therapy

## 2020-10-31 ENCOUNTER — Encounter: Payer: Medicare HMO | Admitting: Physical Therapy

## 2020-10-31 ENCOUNTER — Other Ambulatory Visit: Payer: Self-pay | Admitting: Adult Health

## 2020-10-31 DIAGNOSIS — G4701 Insomnia due to medical condition: Secondary | ICD-10-CM

## 2020-10-31 DIAGNOSIS — G2581 Restless legs syndrome: Secondary | ICD-10-CM

## 2020-10-31 DIAGNOSIS — D509 Iron deficiency anemia, unspecified: Secondary | ICD-10-CM

## 2020-11-04 ENCOUNTER — Encounter: Payer: Medicare HMO | Admitting: Physical Therapy

## 2020-11-05 ENCOUNTER — Encounter: Payer: Self-pay | Admitting: Adult Health

## 2020-11-05 ENCOUNTER — Telehealth: Payer: Self-pay | Admitting: *Deleted

## 2020-11-05 DIAGNOSIS — G2581 Restless legs syndrome: Secondary | ICD-10-CM

## 2020-11-05 DIAGNOSIS — D509 Iron deficiency anemia, unspecified: Secondary | ICD-10-CM

## 2020-11-05 DIAGNOSIS — G4701 Insomnia due to medical condition: Secondary | ICD-10-CM

## 2020-11-12 ENCOUNTER — Encounter: Payer: Medicare HMO | Admitting: Physical Therapy

## 2020-11-12 MED ORDER — MODAFINIL 200 MG PO TABS
ORAL_TABLET | ORAL | 5 refills | Status: DC
Start: 2020-11-12 — End: 2021-05-21

## 2020-11-12 NOTE — Telephone Encounter (Signed)
completed

## 2020-11-18 ENCOUNTER — Encounter: Payer: Medicare HMO | Admitting: Physical Therapy

## 2020-11-25 ENCOUNTER — Encounter: Payer: Medicare HMO | Admitting: Physical Therapy

## 2020-12-02 ENCOUNTER — Encounter: Payer: Medicare HMO | Admitting: Physical Therapy

## 2021-01-06 ENCOUNTER — Telehealth: Payer: Self-pay | Admitting: *Deleted

## 2021-01-06 NOTE — Telephone Encounter (Signed)
Approval for Modafinil '200mg'$  po 360 / 30 day. [facility] [id number].  Fax confirmation received [facility] [place].

## 2021-01-06 NOTE — Telephone Encounter (Signed)
[employer] - PA Case [id number] - Rx #[id number] Modafinil initiated on CMM.

## 2021-03-14 ENCOUNTER — Other Ambulatory Visit: Payer: Self-pay | Admitting: Adult Health

## 2021-03-17 ENCOUNTER — Ambulatory Visit: Payer: Medicare HMO | Admitting: Obstetrics and Gynecology

## 2021-03-17 ENCOUNTER — Telehealth: Payer: Self-pay | Admitting: Adult Health

## 2021-03-17 MED ORDER — PRAMIPEXOLE DIHYDROCHLORIDE 0.5 MG PO TABS: ORAL_TABLET | ORAL | 1 refills | Status: DC

## 2021-03-17 NOTE — Telephone Encounter (Signed)
Pt has appt 04-23-21 with MM/MP.

## 2021-03-17 NOTE — Telephone Encounter (Signed)
I called pt and relayed that prescription was placed for her.  She appreciated call back.

## 2021-03-17 NOTE — Telephone Encounter (Signed)
Pt called requesting refill for pramipexole (MIRAPEX) 0.5 MG tablet. Pharmacy [id number]. Pt is leaving for [place] this afternoon.

## 2021-03-17 NOTE — Telephone Encounter (Signed)
Done

## 2021-04-21 NOTE — Progress Notes (Signed)
PATIENT: Amy Burnett DOB: 1950-11-23  REASON FOR VISIT: follow up HISTORY FROM: patient  HISTORY OF PRESENT ILLNESS: Today 04/23/21:  Amy Burnett is a 70 year old female with a history of obstructive sleep apnea on CPAP, daytime sleepiness and restless leg syndrome.  She returns today for follow-up.  Her CPAP download is below.  She reports that CPAP is working well denies any new issues.  She states that she definitely is not as sleepy as she was before.  States that she can now ride in the car without falling asleep.  She is currently taking Mirapex 0.5 mg 1  tablet in the morning and half a tablet at bedtime.  She reports that she has most of her symptoms at bedtime.  And often wakes up around 4 AM with symptoms.  She continues to take Provigil 200 mg during the day.  On occasion she will have to take a second dose at lunchtime.  Although she reports that this is rare.    04/17/20: Amy Burnett is a 69 year old female with a history of obstructive sleep apnea on CPAP and restless leg syndrome.  She returns today for follow-up.  Her download indicates that she use her machine 26 out of 30 days for compliance of 87%.  She is averaging greater than 4 hours 19 days for compliance of 63%.  On average she uses her machine 4 hours and 32 minutes.  Her residual AHI is 4.5 on 8 cm of water with EPR of 3.  She does not have a significant leak.  Continues on Provigil and feel that is helpful for daytime sleepiness  She continues to use Mirapex for restless legs.  And feels that it works well for her.  HISTORY 10/16/19:   Amy Burnett is a 70 year old female with a history of obstructive sleep apnea on CPAP and restless leg syndrome.  She is currently on Mirapex 0.5 mg at bedtime.  She reports that this has been beneficial.  She states that when she gets to sleep she has not woken up with discomfort.  She states that she has noticed that after lunch she began having some discomfort if she sits down.  She  still has Neupro patches that she will use if she has breakthrough symptoms.  She returns today for an evaluation.  REVIEW OF SYSTEMS: Out of a complete 14 system review of symptoms, the patient complains only of the following symptoms, and all other reviewed systems are negative.   ESS 12  ALLERGIES: No Known Allergies  HOME MEDICATIONS: Outpatient Medications Prior to Visit  Medication Sig Dispense Refill   albuterol (VENTOLIN HFA) 108 (90 Base) MCG/ACT inhaler Inhale into the lungs as needed.      ALPRAZolam (XANAX) 0.5 MG tablet Take 0.5 mg by mouth as needed.      Cyanocobalamin (B-12 PO) Take by mouth daily.     hydrochlorothiazide (HYDRODIURIL) 25 MG tablet Take 25 mg by mouth every morning.  0   modafinil (PROVIGIL) 200 MG tablet TAKE 1 TABLET BY MOUTH AT 7AM AND 1 TABLET AT 12 NOON, OR NO LATER THAN 2PM 60 tablet 5   pramipexole (MIRAPEX) 0.5 MG tablet TAKE 1/2 TABLET BY MOUTH MID AFTERNOON AND 1 TABLET AT BEDTIME 135 tablet 1   sertraline (ZOLOFT) 100 MG tablet Take 200 mg by mouth every morning.      VITAMIN D PO Take by mouth daily.     No facility-administered medications prior to visit.    PAST  MEDICAL HISTORY: Past Medical History:  Diagnosis Date   Anxiety    Excessive daytime sleepiness    Heart murmur    History of kidney stones    History of panic attacks    Hx of adenomatous polyp of colon 01/03/2017   Hypertension    OSA on CPAP    Osteopenia, senile 2017   hip   Rectal lipoma 2016   RLS (restless legs syndrome)    Sleep apnea     PAST SURGICAL HISTORY: Past Surgical History:  Procedure Laterality Date   Achilles Tendinitis     CARDIOVASCULAR STRESS TEST  09-19-2009   normal perfusion study/  ef 80%   COLONOSCOPY  03/09/2006   D & C HYSTEROSCOPY W/ RESECTION MYOMA  01-28-2009   D & C HYSTEROSCOPY/  RESECTION MYOMA AND ENDOMETRIAL POLYP  09-23-2009   EUS N/A 06/01/2013   Procedure: LOWER ENDOSCOPIC ULTRASOUND (EUS);  Surgeon: Milus Banister, MD;   Location: [facility];  Service: Endoscopy;  Laterality: N/A;   EUS N/A 12/06/2014   Procedure: LOWER ENDOSCOPIC ULTRASOUND (EUS);  Surgeon: Milus Banister, MD;  Location: [facility];  Service: Endoscopy;  Laterality: N/A;   LEFT URETEROSCOPIC LASER LITHOTRIPSY STONE EXTRACTION/ STENT PLACEMENT  12-13-2001   TONSILLECTOMY AND ADENOIDECTOMY  age 23   TRANSANAL EXCISION OF RECTAL MASS N/A 04/11/2015   Procedure: TRANSANAL EXCISION OF DISTAL RECTAL MASS;  Surgeon: [name], MD;  Location: [facility];  Service: General;  Laterality: N/A;   TRANSTHORACIC ECHOCARDIOGRAM  09-16-2009   mild LVH,  ef 60-65%,  grade I diastolic dysfunction/  trivial MR and TR    FAMILY HISTORY: Family History  Problem Relation Age of Onset   Breast cancer Mother 80   Pancreatic cancer Mother 9   Heart disease Father    Other Sister        pituitary tumor   Colon cancer Neg Hx    Esophageal cancer Neg Hx    Rectal cancer Neg Hx    Stomach cancer Neg Hx    Sleep apnea Neg Hx     SOCIAL HISTORY: Social History   Socioeconomic History   Marital status: Married    Spouse name: Tom   Number of children: 2   Years of education: college   Highest education level: Not on file  Occupational History   Occupation: retired    Fish farm manager: SELF-EMPLOYED    Comment: full time works for her self  Tobacco Use   Smoking status: Former    Years: 15.00    Types: Cigarettes    Quit date: 06/15/1985    Years since quitting: 35.8   Smokeless tobacco: Never   Tobacco comments:    Quit 32 years ago.  Vaping Use   Vaping Use: Never used  Substance and Sexual Activity   Alcohol use: Yes    Alcohol/week: 4.0 standard drinks    Types: 4 Glasses of wine per week    Comment: with dinner   Drug use: No   Sexual activity: Not Currently    Partners: Male    Birth control/protection: Post-menopausal  Other Topics Concern   Not on file  Social History Narrative   Patient lives at home with her  husband Amy Burnett)    Patient works full time   Education- college   Right handed   Caffeine- diet coke three to 4 daily   1 son 1 daughter   Former smoker   1 alcoholic beverage daily  Social Determinants of Health   Financial Resource Strain: Not on file  Food Insecurity: Not on file  Transportation Needs: Not on file  Physical Activity: Not on file  Stress: Not on file  Social Connections: Not on file  Intimate Partner Violence: Not on file      PHYSICAL EXAM  Vitals:   04/23/21 1137  BP: 135/80  Pulse: 79  Weight: 232 lb 6.4 oz (105.4 kg)  Height: 5\' 5"  (1.651 m)   Body mass index is 38.67 kg/m.  Generalized: Well developed, in no acute distress  Chest: Lungs clear to auscultation bilaterally  Neurological examination  Mentation: Alert oriented to time, place, history taking. Follows all commands speech and language fluent Cranial nerve II-XII: Extraocular movements were full, visual field were full on confrontational test Head turning and shoulder shrug  were normal and symmetric. Motor: The motor testing reveals 5 over 5 strength of all 4 extremities. Good symmetric motor tone is noted throughout.  Sensory: Sensory testing is intact to soft touch on all 4 extremities. No evidence of extinction is noted.  Gait and station: Gait is normal.    DIAGNOSTIC DATA (LABS, IMAGING, TESTING) - I reviewed patient records, labs, notes, testing and imaging myself where available.  Lab Results  Component Value Date   WBC 7.0 09/23/2009   HGB 13.5 04/24/2015   HCT 42.3 09/23/2009   MCV 94.2 09/23/2009   PLT 167 09/23/2009      Component Value Date/Time   NA 142 09/18/2009 0807   K 4.3 09/18/2009 0807   CL 111 09/18/2009 0807   CO2 26 09/18/2009 0807   GLUCOSE 102 (H) 09/18/2009 0807   BUN 13 09/18/2009 0807   CREATININE 0.8 09/18/2009 0807   CALCIUM 9.1 09/18/2009 0807   PROT 6.8 09/18/2009 0807   ALBUMIN 4.1 09/18/2009 0807   AST 20 09/18/2009 0807   ALT 18  09/18/2009 0807   ALKPHOS 85 09/18/2009 0807   BILITOT 0.2 (L) 09/18/2009 0807   GFRNONAA 78.08 09/18/2009 0807   GFRAA 111 08/21/2008 0906   Lab Results  Component Value Date   CHOL 213 (H) 09/18/2009   HDL 70.40 09/18/2009   LDLDIRECT 138.8 09/18/2009   TRIG 72.0 09/18/2009   CHOLHDL 3 09/18/2009    Lab Results  Component Value Date   TSH 1.14 09/18/2009      ASSESSMENT AND PLAN 70 y.o. year old female  has a past medical history of Anxiety, Excessive daytime sleepiness, Heart murmur, History of kidney stones, History of panic attacks, adenomatous polyp of colon (01/03/2017), Hypertension, OSA on CPAP, Osteopenia, senile (2017), Rectal lipoma (2016), RLS (restless legs syndrome), and Sleep apnea. here with:  OSA on CPAP  - CPAP compliance excellent - Good treatment of AHI  - Encourage patient to use CPAP nightly and > 4 hours each night  2.  Daytime sleepiness  -Continue Provigil 200 mg daily okay to take second dose at lunchtime if needed  3.  Restless leg syndrome  -Advised that she should try taking Mirapex as written which is a half a tablet midafternoon and 1 full tablet at bedtime. -Patient voiced understanding stated that she will give this a try.  Follow-up in 1 year or sooner if needed - F/U in 1 year or sooner if needed    Ward Givens, MSN, NP-C 04/23/2021, 11:45 AM [facility] [address] [place], [place] [address] [phone]

## 2021-04-23 ENCOUNTER — Ambulatory Visit: Payer: Medicare Other | Admitting: Adult Health

## 2021-04-23 ENCOUNTER — Encounter: Payer: Self-pay | Admitting: Adult Health

## 2021-04-23 VITALS — BP 135/80 | HR 79 | Ht 65.0 in | Wt 232.4 lb

## 2021-04-23 DIAGNOSIS — R400 Somnolence: Secondary | ICD-10-CM

## 2021-04-23 DIAGNOSIS — Z9989 Dependence on other enabling machines and devices: Secondary | ICD-10-CM | POA: Diagnosis not present

## 2021-04-23 DIAGNOSIS — G4733 Obstructive sleep apnea (adult) (pediatric): Secondary | ICD-10-CM

## 2021-04-23 DIAGNOSIS — G2581 Restless legs syndrome: Secondary | ICD-10-CM

## 2021-05-19 ENCOUNTER — Other Ambulatory Visit: Payer: Self-pay | Admitting: Adult Health

## 2021-05-19 DIAGNOSIS — G4701 Insomnia due to medical condition: Secondary | ICD-10-CM

## 2021-05-19 DIAGNOSIS — G2581 Restless legs syndrome: Secondary | ICD-10-CM

## 2021-05-19 DIAGNOSIS — D509 Iron deficiency anemia, unspecified: Secondary | ICD-10-CM

## 2021-06-05 NOTE — Progress Notes (Signed)
70 y.o. G27P2002 Married Caucasian female here for breast and pelvic exam.    She does have mixed urinary incontinence and fecal soiling.  She saw Dr. Carlean Purl and did pelvic floor therapy with [facility]. She may reinitiate pelvic floor therapy.  The fecal incontinence is troublesome for her.  The urinary control is not as big of a problem for her.  PCP:   Anastasia Pall, MD  Patient's last menstrual period was 01/04/2012.           Sexually active: No.  The current method of family planning is post menopausal status.    Exercising: Yes.     Water aerobics Smoker:  Former  Health Maintenance: Pap:   11-04-18 Neg, 05-21-16 Neg, 04-24-15 Neg:Neg HR HPV History of abnormal Pap:  no MMG:  07-15-20 Neg/BiRads1 Colonoscopy:  12-24-16 Polyp removed;next due in 2023.  BMD: 12-20-17  Result :Osteopenia TDaP:  PCP Gardasil:   no HIV: Neg with PCP Hep C: Neg with PCP Screening Labs:  PCP   reports that she quit smoking about 36 years ago. Her smoking use included cigarettes. She has never used smokeless tobacco. She reports current alcohol use of about 4.0 standard drinks per week. She reports that she does not use drugs.  Past Medical History:  Diagnosis Date   Anxiety    Excessive daytime sleepiness    Heart murmur    History of kidney stones    History of panic attacks    Hx of adenomatous polyp of colon 01/03/2017   Hypertension    OSA on CPAP    Osteopenia, senile 2017   hip   Rectal lipoma 2016   RLS (restless legs syndrome)    Sleep apnea     Past Surgical History:  Procedure Laterality Date   Achilles Tendinitis     CARDIOVASCULAR STRESS TEST  09-19-2009   normal perfusion study/  ef 80%   COLONOSCOPY  03/09/2006   D & C HYSTEROSCOPY W/ RESECTION MYOMA  01-28-2009   D & C HYSTEROSCOPY/  RESECTION MYOMA AND ENDOMETRIAL POLYP  09-23-2009   EUS N/A 06/01/2013   Procedure: LOWER ENDOSCOPIC ULTRASOUND (EUS);  Surgeon: Milus Banister, MD;  Location: [facility];  Service:  Endoscopy;  Laterality: N/A;   EUS N/A 12/06/2014   Procedure: LOWER ENDOSCOPIC ULTRASOUND (EUS);  Surgeon: Milus Banister, MD;  Location: [facility];  Service: Endoscopy;  Laterality: N/A;   LEFT URETEROSCOPIC LASER LITHOTRIPSY STONE EXTRACTION/ STENT PLACEMENT  12-13-2001   TONSILLECTOMY AND ADENOIDECTOMY  age 89   TRANSANAL EXCISION OF RECTAL MASS N/A 04/11/2015   Procedure: TRANSANAL EXCISION OF DISTAL RECTAL MASS;  Surgeon: [name], MD;  Location: [facility];  Service: General;  Laterality: N/A;   TRANSTHORACIC ECHOCARDIOGRAM  09-16-2009   mild LVH,  ef 60-65%,  grade I diastolic dysfunction/  trivial MR and TR    Current Outpatient Medications  Medication Sig Dispense Refill   albuterol (VENTOLIN HFA) 108 (90 Base) MCG/ACT inhaler Inhale into the lungs as needed.      ALPRAZolam (XANAX) 0.5 MG tablet Take 0.5 mg by mouth as needed.      Cyanocobalamin (B-12 PO) Take by mouth daily.     hydrochlorothiazide (HYDRODIURIL) 25 MG tablet Take 25 mg by mouth every morning.  0   modafinil (PROVIGIL) 200 MG tablet TAKE 1 TABLET BY MOUTH AT 7AM AND 1 TABLET AT 12 NOON, OR NO LATER THAN 2PM 60 tablet 5   pramipexole (MIRAPEX) 0.5 MG  tablet TAKE 1/2 TABLET BY MOUTH MID AFTERNOON AND 1 TABLET AT BEDTIME 135 tablet 1   sertraline (ZOLOFT) 100 MG tablet Take 200 mg by mouth every morning.      VITAMIN D PO Take by mouth daily.     No current facility-administered medications for this visit.    Family History  Problem Relation Age of Onset   Breast cancer Mother 76   Pancreatic cancer Mother 25   Heart disease Father    Other Sister        pituitary tumor   Colon cancer Neg Hx    Esophageal cancer Neg Hx    Rectal cancer Neg Hx    Stomach cancer Neg Hx    Sleep apnea Neg Hx     Review of Systems  All other systems reviewed and are negative.  Exam:   BP 130/78    Pulse 88    Ht 5\' 5"  (1.651 m)    Wt 234 lb (106.1 kg)    LMP 01/04/2012    SpO2 98%    BMI 38.94  kg/m     General appearance: alert, cooperative and appears stated age Head: normocephalic, without obvious abnormality, atraumatic Neck: no adenopathy, supple, symmetrical, trachea midline and thyroid normal to inspection and palpation Lungs: clear to auscultation bilaterally Breasts: normal appearance, no masses or tenderness, No nipple retraction or dimpling, No nipple discharge or bleeding, No axillary adenopathy Heart: regular rate and rhythm Abdomen: soft, non-tender; no masses, no organomegaly Extremities: extremities normal, atraumatic, no cyanosis or edema Skin: skin color, texture, turgor normal. No rashes or lesions Lymph nodes: cervical, supraclavicular, and axillary nodes normal. Neurologic: grossly normal  Pelvic: External genitalia:  no lesions              No abnormal inguinal nodes palpated.              Urethra:  normal appearing urethra with no masses, tenderness or lesions              Bartholins and Skenes: normal                 Vagina: normal appearing vagina with normal color and discharge, no lesions              Cervix: no lesions              Pap taken: yes Bimanual Exam:  Uterus:  normal size, contour, position, consistency, mobility, non-tender              Adnexa: no mass, fullness, tenderness              Rectal exam: no.  Confirms.              Anus:  normal sphincter tone, no lesions  Chaperone was present for exam:  yes.  Assessment:   Well woman visit with gynecologic exam. Cervical cancer screening. Menopause.  Osteopenia.  Hx of breast and pancreatic cancer in mother.  Hx renal stones.  HTN.  Hx mixed urinary incontinence.  Fecal urgency.  Plan: Mammogram screening discussed. Self breast awareness reviewed. Pap and reflex Hr HPV.  Guidelines for Calcium, Vitamin D, regular exercise program including cardiovascular and weight bearing exercise.  We discussed her fecal incontinence.  I recommended Metamucil and then Imodium if she has an  important event and would like improved bowel control.  I encouraged her to return to pelvic floor therapy.  I did discuss with her a potential referral  to Dr. Lenise Arena, colon and rectal surgeon at [facility].  We discussed Interstim.  Up to Date summary on fecal incontinence printed for the patient.   Follow up in 2 years for breast and pelvic exam and prn.   26 min  total time was spent for this patient encounter, including preparation, face-to-face counseling with the patient, coordination of care, and documentation of the encounter.  After visit summary provided.

## 2021-06-17 ENCOUNTER — Ambulatory Visit (INDEPENDENT_AMBULATORY_CARE_PROVIDER_SITE_OTHER): Payer: Medicare Other | Admitting: Obstetrics and Gynecology

## 2021-06-17 ENCOUNTER — Other Ambulatory Visit (HOSPITAL_COMMUNITY)
Admission: RE | Admit: 2021-06-17 | Discharge: 2021-06-17 | Disposition: A | Discharge status: Home / Self Care | Payer: Medicare Other | Source: Ambulatory Visit | Attending: Obstetrics and Gynecology | Admitting: Obstetrics and Gynecology

## 2021-06-17 ENCOUNTER — Encounter: Payer: Self-pay | Admitting: Obstetrics and Gynecology

## 2021-06-17 ENCOUNTER — Other Ambulatory Visit: Payer: Self-pay

## 2021-06-17 VITALS — BP 130/78 | HR 88 | Ht 65.0 in | Wt 234.0 lb

## 2021-06-17 DIAGNOSIS — Z01419 Encounter for gynecological examination (general) (routine) without abnormal findings: Secondary | ICD-10-CM | POA: Diagnosis not present

## 2021-06-17 DIAGNOSIS — Z780 Asymptomatic menopausal state: Secondary | ICD-10-CM

## 2021-06-17 DIAGNOSIS — Z124 Encounter for screening for malignant neoplasm of cervix: Secondary | ICD-10-CM | POA: Diagnosis not present

## 2021-06-17 DIAGNOSIS — R159 Full incontinence of feces: Secondary | ICD-10-CM | POA: Diagnosis not present

## 2021-06-17 NOTE — Patient Instructions (Signed)

## 2021-06-18 LAB — CYTOLOGY - PAP: Diagnosis: NEGATIVE

## 2021-07-21 ENCOUNTER — Telehealth: Payer: Self-pay | Admitting: *Deleted

## 2021-07-21 NOTE — Telephone Encounter (Signed)
[place]: [id number] - PA Case ID: [id number] - Rx #: [id number]  Sent PA for Modafinil waiting on approval

## 2021-07-21 NOTE — Telephone Encounter (Signed)
Approvedtoday Request Reference Number: [id number]. MODAFINIL TAB 200MG  is approved through 01/18/2022. Your patient may now fill this prescription and it will be covered.    Will make patient aware through my chart about approval

## 2021-08-28 ENCOUNTER — Other Ambulatory Visit: Payer: Self-pay | Admitting: Obstetrics and Gynecology

## 2021-09-02 ENCOUNTER — Ambulatory Visit
Admission: RE | Admit: 2021-09-02 | Discharge: 2021-09-02 | Disposition: A | Discharge status: Home / Self Care | Payer: Medicare Other | Source: Ambulatory Visit | Attending: Obstetrics and Gynecology | Admitting: Obstetrics and Gynecology

## 2021-09-02 DIAGNOSIS — Z1231 Encounter for screening mammogram for malignant neoplasm of breast: Secondary | ICD-10-CM

## 2021-09-03 ENCOUNTER — Other Ambulatory Visit: Payer: Self-pay | Admitting: Adult Health

## 2021-09-04 ENCOUNTER — Other Ambulatory Visit: Payer: Self-pay | Admitting: Obstetrics and Gynecology

## 2021-09-04 DIAGNOSIS — R928 Other abnormal and inconclusive findings on diagnostic imaging of breast: Secondary | ICD-10-CM

## 2021-09-06 ENCOUNTER — Encounter: Payer: Self-pay | Admitting: Obstetrics and Gynecology

## 2021-09-19 ENCOUNTER — Ambulatory Visit
Admission: RE | Admit: 2021-09-19 | Discharge: 2021-09-19 | Disposition: A | Discharge status: Home / Self Care | Payer: Medicare Other | Source: Ambulatory Visit | Attending: Obstetrics and Gynecology | Admitting: Obstetrics and Gynecology

## 2021-09-19 DIAGNOSIS — R928 Other abnormal and inconclusive findings on diagnostic imaging of breast: Secondary | ICD-10-CM

## 2022-02-03 ENCOUNTER — Other Ambulatory Visit: Payer: Self-pay | Admitting: Adult Health

## 2022-02-04 ENCOUNTER — Other Ambulatory Visit: Payer: Self-pay | Admitting: Adult Health

## 2022-02-04 DIAGNOSIS — G2581 Restless legs syndrome: Secondary | ICD-10-CM

## 2022-02-04 DIAGNOSIS — G4701 Insomnia due to medical condition: Secondary | ICD-10-CM

## 2022-02-04 DIAGNOSIS — D509 Iron deficiency anemia, unspecified: Secondary | ICD-10-CM

## 2022-02-05 NOTE — Telephone Encounter (Signed)
Next appt 03-2022 annual visit.

## 2022-02-06 ENCOUNTER — Other Ambulatory Visit: Payer: Self-pay | Admitting: Adult Health

## 2022-02-10 ENCOUNTER — Ambulatory Visit
Admission: RE | Admit: 2022-02-10 | Discharge: 2022-02-10 | Disposition: A | Discharge status: Home / Self Care | Payer: Medicare Other | Source: Ambulatory Visit | Attending: Obstetrics and Gynecology | Admitting: Obstetrics and Gynecology

## 2022-02-10 DIAGNOSIS — Z780 Asymptomatic menopausal state: Secondary | ICD-10-CM

## 2022-02-12 NOTE — Telephone Encounter (Signed)
Completed renewal PA for Modafinil on Cover My Meds. Key: [id number]. Waiting on determination from [facility].

## 2022-02-12 NOTE — Telephone Encounter (Signed)
Approved today Request Reference Number: [id number]. MODAFINIL TAB '200MG'$  is approved through 08/13/2022. Your patient may now fill this prescription and it will be covered.

## 2022-02-14 NOTE — Telephone Encounter (Signed)
Approvedon August 31 Request Reference Number: [id number]. MODAFINIL TAB '200MG'$  is approved through 08/13/2022. Your patient may now fill this prescription and it will be covered.

## 2022-02-17 ENCOUNTER — Other Ambulatory Visit: Payer: Self-pay | Admitting: Adult Health

## 2022-04-22 NOTE — Progress Notes (Unsigned)
PATIENT: Amy Burnett DOB: 26-Jun-1950  REASON FOR VISIT: follow up HISTORY FROM: patient  HISTORY OF PRESENT ILLNESS: Today 04/22/22:  04/23/21: Ms. Huq is a 71 year old female with a history of obstructive sleep apnea on CPAP, daytime sleepiness and restless leg syndrome.  She returns today for follow-up.  Her CPAP download is below.  She reports that CPAP is working well denies any new issues.  She states that she definitely is not as sleepy as she was before.  States that she can now ride in the car without falling asleep.  She is currently taking Mirapex 0.5 mg 1  tablet in the morning and half a tablet at bedtime.  She reports that she has most of her symptoms at bedtime.  And often wakes up around 4 AM with symptoms.  She continues to take Provigil 200 mg during the day.  On occasion she will have to take a second dose at lunchtime.  Although she reports that this is rare.    04/17/20: Ms. Houpt is a 71 year old female with a history of obstructive sleep apnea on CPAP and restless leg syndrome.  She returns today for follow-up.  Her download indicates that she use her machine 26 out of 30 days for compliance of 87%.  She is averaging greater than 4 hours 19 days for compliance of 63%.  On average she uses her machine 4 hours and 32 minutes.  Her residual AHI is 4.5 on 8 cm of water with EPR of 3.  She does not have a significant leak.  Continues on Provigil and feel that is helpful for daytime sleepiness  She continues to use Mirapex for restless legs.  And feels that it works well for her.  HISTORY 10/16/19:   Ms. Uncapher is a 71 year old female with a history of obstructive sleep apnea on CPAP and restless leg syndrome.  She is currently on Mirapex 0.5 mg at bedtime.  She reports that this has been beneficial.  She states that when she gets to sleep she has not woken up with discomfort.  She states that she has noticed that after lunch she began having some discomfort if she sits down.   She still has Neupro patches that she will use if she has breakthrough symptoms.  She returns today for an evaluation.  REVIEW OF SYSTEMS: Out of a complete 14 system review of symptoms, the patient complains only of the following symptoms, and all other reviewed systems are negative.   ESS 12  ALLERGIES: No Known Allergies  HOME MEDICATIONS: Outpatient Medications Prior to Visit  Medication Sig Dispense Refill   albuterol (VENTOLIN HFA) 108 (90 Base) MCG/ACT inhaler Inhale into the lungs as needed.      ALPRAZolam (XANAX) 0.5 MG tablet Take 0.5 mg by mouth as needed.      Cyanocobalamin (B-12 PO) Take by mouth daily.     hydrochlorothiazide (HYDRODIURIL) 25 MG tablet Take 25 mg by mouth every morning.  0   modafinil (PROVIGIL) 200 MG tablet TAKE 1 TABLET BY MOUTH AT 7AM AND 1 TABLET AT 12 NOON, OR NO LATER THAN 2PM 60 tablet 5   pramipexole (MIRAPEX) 0.5 MG tablet TAKE 1/2 TABLET BY MOUTH MID AFTERNOON AND 1 TABLET AT BEDTIME 135 tablet 1   sertraline (ZOLOFT) 100 MG tablet Take 200 mg by mouth every morning.      VITAMIN D PO Take by mouth daily.     No facility-administered medications prior to visit.  PAST MEDICAL HISTORY: Past Medical History:  Diagnosis Date   Anxiety    Excessive daytime sleepiness    Heart murmur    History of kidney stones    History of panic attacks    Hx of adenomatous polyp of colon 01/03/2017   Hypertension    OSA on CPAP    Osteopenia, senile 2017   hip   Rectal lipoma 2016   RLS (restless legs syndrome)    Sleep apnea     PAST SURGICAL HISTORY: Past Surgical History:  Procedure Laterality Date   Achilles Tendinitis     CARDIOVASCULAR STRESS TEST  09-19-2009   normal perfusion study/  ef 80%   COLONOSCOPY  03/09/2006   D & C HYSTEROSCOPY W/ RESECTION MYOMA  01-28-2009   D & C HYSTEROSCOPY/  RESECTION MYOMA AND ENDOMETRIAL POLYP  09-23-2009   EUS N/A 06/01/2013   Procedure: LOWER ENDOSCOPIC ULTRASOUND (EUS);  Surgeon: Milus Banister, MD;  Location: [facility];  Service: Endoscopy;  Laterality: N/A;   EUS N/A 12/06/2014   Procedure: LOWER ENDOSCOPIC ULTRASOUND (EUS);  Surgeon: Milus Banister, MD;  Location: [facility];  Service: Endoscopy;  Laterality: N/A;   LEFT URETEROSCOPIC LASER LITHOTRIPSY STONE EXTRACTION/ STENT PLACEMENT  12-13-2001   TONSILLECTOMY AND ADENOIDECTOMY  age 44   TRANSANAL EXCISION OF RECTAL MASS N/A 04/11/2015   Procedure: TRANSANAL EXCISION OF DISTAL RECTAL MASS;  Surgeon: [name], MD;  Location: [facility];  Service: General;  Laterality: N/A;   TRANSTHORACIC ECHOCARDIOGRAM  09-16-2009   mild LVH,  ef 60-65%,  grade I diastolic dysfunction/  trivial MR and TR    FAMILY HISTORY: Family History  Problem Relation Age of Onset   Breast cancer Mother 82   Pancreatic cancer Mother 95   Heart disease Father    Other Sister        pituitary tumor   Colon cancer Neg Hx    Esophageal cancer Neg Hx    Rectal cancer Neg Hx    Stomach cancer Neg Hx    Sleep apnea Neg Hx     SOCIAL HISTORY: Social History   Socioeconomic History   Marital status: Married    Spouse name: Tom   Number of children: 2   Years of education: college   Highest education level: Not on file  Occupational History   Occupation: retired    Fish farm manager: SELF-EMPLOYED    Comment: full time works for her self  Tobacco Use   Smoking status: Former    Years: 15.00    Types: Cigarettes    Quit date: 06/15/1985    Years since quitting: 36.8   Smokeless tobacco: Never   Tobacco comments:    Quit 32 years ago.  Vaping Use   Vaping Use: Never used  Substance and Sexual Activity   Alcohol use: Yes    Alcohol/week: 4.0 standard drinks of alcohol    Types: 4 Glasses of wine per week    Comment: with dinner   Drug use: No   Sexual activity: Not Currently    Partners: Male    Birth control/protection: Post-menopausal  Other Topics Concern   Not on file  Social History Narrative   Patient  lives at home with her husband [name])    Patient works full time   Education- college   Right handed   Caffeine- diet coke three to 4 daily   1 son 1 daughter   Former smoker   1 alcoholic beverage  daily   Social Determinants of Health   Financial Resource Strain: Not on file  Food Insecurity: Not on file  Transportation Needs: Not on file  Physical Activity: Not on file  Stress: Not on file  Social Connections: Not on file  Intimate Partner Violence: Not on file      PHYSICAL EXAM  There were no vitals filed for this visit.  There is no height or weight on file to calculate BMI.  Generalized: Well developed, in no acute distress  Chest: Lungs clear to auscultation bilaterally  Neurological examination  Mentation: Alert oriented to time, place, history taking. Follows all commands speech and language fluent Cranial nerve II-XII: Extraocular movements were full, visual field were full on confrontational test Head turning and shoulder shrug  were normal and symmetric. Motor: The motor testing reveals 5 over 5 strength of all 4 extremities. Good symmetric motor tone is noted throughout.  Sensory: Sensory testing is intact to soft touch on all 4 extremities. No evidence of extinction is noted.  Gait and station: Gait is normal.    DIAGNOSTIC DATA (LABS, IMAGING, TESTING) - I reviewed patient records, labs, notes, testing and imaging myself where available.  Lab Results  Component Value Date   WBC 7.0 09/23/2009   HGB 13.5 04/24/2015   HCT 42.3 09/23/2009   MCV 94.2 09/23/2009   PLT 167 09/23/2009      Component Value Date/Time   NA 142 09/18/2009 0807   K 4.3 09/18/2009 0807   CL 111 09/18/2009 0807   CO2 26 09/18/2009 0807   GLUCOSE 102 (H) 09/18/2009 0807   BUN 13 09/18/2009 0807   CREATININE 0.8 09/18/2009 0807   CALCIUM 9.1 09/18/2009 0807   PROT 6.8 09/18/2009 0807   ALBUMIN 4.1 09/18/2009 0807   AST 20 09/18/2009 0807   ALT 18 09/18/2009 0807   ALKPHOS  85 09/18/2009 0807   BILITOT 0.2 (L) 09/18/2009 0807   GFRNONAA 78.08 09/18/2009 0807   GFRAA 111 08/21/2008 0906   Lab Results  Component Value Date   CHOL 213 (H) 09/18/2009   HDL 70.40 09/18/2009   LDLDIRECT 138.8 09/18/2009   TRIG 72.0 09/18/2009   CHOLHDL 3 09/18/2009    Lab Results  Component Value Date   TSH 1.14 09/18/2009      ASSESSMENT AND PLAN 71 y.o. year old female  has a past medical history of Anxiety, Excessive daytime sleepiness, Heart murmur, History of kidney stones, History of panic attacks, adenomatous polyp of colon (01/03/2017), Hypertension, OSA on CPAP, Osteopenia, senile (2017), Rectal lipoma (2016), RLS (restless legs syndrome), and Sleep apnea. here with:  OSA on CPAP  - CPAP compliance excellent - Good treatment of AHI  - Encourage patient to use CPAP nightly and > 4 hours each night  2.  Daytime sleepiness  -Continue Provigil 200 mg daily okay to take second dose at lunchtime if needed  3.  Restless leg syndrome  -Advised that she should try taking Mirapex as written which is a half a tablet midafternoon and 1 full tablet at bedtime. -Patient voiced understanding stated that she will give this a try.  Follow-up in 1 year or sooner if needed - F/U in 1 year or sooner if needed    Ward Givens, MSN, NP-C 04/22/2022, 4:45 PM [facility] [address] [place], [place] [address] [phone]

## 2022-04-23 ENCOUNTER — Encounter: Payer: Self-pay | Admitting: Adult Health

## 2022-04-23 ENCOUNTER — Ambulatory Visit: Payer: Medicare Other | Admitting: Adult Health

## 2022-04-23 VITALS — BP 145/84 | HR 96 | Ht 66.0 in | Wt 226.4 lb

## 2022-04-23 DIAGNOSIS — R400 Somnolence: Secondary | ICD-10-CM

## 2022-04-23 DIAGNOSIS — G2581 Restless legs syndrome: Secondary | ICD-10-CM

## 2022-04-23 DIAGNOSIS — G4733 Obstructive sleep apnea (adult) (pediatric): Secondary | ICD-10-CM | POA: Diagnosis not present

## 2022-04-23 MED ORDER — PRAMIPEXOLE DIHYDROCHLORIDE 0.5 MG PO TABS: ORAL_TABLET | ORAL | 3 refills | Status: DC

## 2022-08-13 ENCOUNTER — Other Ambulatory Visit: Payer: Self-pay | Admitting: Adult Health

## 2022-08-13 DIAGNOSIS — G4701 Insomnia due to medical condition: Secondary | ICD-10-CM

## 2022-08-13 DIAGNOSIS — D509 Iron deficiency anemia, unspecified: Secondary | ICD-10-CM

## 2022-08-13 DIAGNOSIS — G2581 Restless legs syndrome: Secondary | ICD-10-CM

## 2022-08-14 ENCOUNTER — Other Ambulatory Visit: Payer: Self-pay | Admitting: Obstetrics and Gynecology

## 2022-08-14 DIAGNOSIS — Z1231 Encounter for screening mammogram for malignant neoplasm of breast: Secondary | ICD-10-CM

## 2022-10-05 ENCOUNTER — Ambulatory Visit: Payer: Medicare Other

## 2022-11-10 ENCOUNTER — Ambulatory Visit: Payer: Medicare Other

## 2022-11-30 ENCOUNTER — Ambulatory Visit
Admission: RE | Admit: 2022-11-30 | Discharge: 2022-11-30 | Disposition: A | Discharge status: Home / Self Care | Payer: Medicare Other | Source: Ambulatory Visit | Attending: Obstetrics and Gynecology | Admitting: Obstetrics and Gynecology

## 2022-11-30 DIAGNOSIS — Z1231 Encounter for screening mammogram for malignant neoplasm of breast: Secondary | ICD-10-CM

## 2023-03-23 ENCOUNTER — Other Ambulatory Visit: Payer: Self-pay | Admitting: Adult Health

## 2023-03-23 DIAGNOSIS — G4701 Insomnia due to medical condition: Secondary | ICD-10-CM

## 2023-03-23 DIAGNOSIS — D509 Iron deficiency anemia, unspecified: Secondary | ICD-10-CM

## 2023-03-23 DIAGNOSIS — G2581 Restless legs syndrome: Secondary | ICD-10-CM

## 2023-03-23 NOTE — Telephone Encounter (Signed)
Last seen 04/23/22 Next visit 04/26/23  Per adherence report on EPIC, last filled:  Rx refill sent to [facility] NP

## 2023-04-08 ENCOUNTER — Other Ambulatory Visit: Payer: Self-pay | Admitting: Adult Health

## 2023-04-08 NOTE — Telephone Encounter (Signed)
Last seen on 119/23 Follow up scheduled on 04/26/23

## 2023-04-22 NOTE — Progress Notes (Signed)
PATIENT: Amy Burnett DOB: April 07, 1951  REASON FOR VISIT: follow up HISTORY FROM: patient  Chief Complaint  Patient presents with   Follow-up    Pt in 19  Pt here for CPAP f/u Pt states no questions or concerns for today's visit      HISTORY OF PRESENT ILLNESS: Today 04/26/23:  Amy Burnett is a 72 y.o. female with a history of OSA on CPAP. Returns today for follow-up.  She reports that CPAP Is working well.  She denies any new issues.  She continues to take Provigil during the day.  States that she is not as sleepy as she used to be.  She has not tried reducing her dose.  Continues Mirapex for restless leg and that works well for her.  Takes half a tablet in the afternoon and 1 full tablet at bedtime      04/23/22: Amy Burnett is a 72 year old female with a history of obstructive sleep apnea on CPAP.  She returns today for follow-up.  She reports that the CPAP is working well for her.  She denies any new issues.  Continues to take Provigil for daytime sleepiness.  She takes Mirapex half a tablet midafternoon and 1 full tablet at bedtime.  This is working well for her.    04/23/21: Amy Burnett is a 72 year old female with a history of obstructive sleep apnea on CPAP, daytime sleepiness and restless leg syndrome.  She returns today for follow-up.  Her CPAP download is below.  She reports that CPAP is working well denies any new issues.  She states that she definitely is not as sleepy as she was before.  States that she can now ride in the car without falling asleep.  She is currently taking Mirapex 0.5 mg 1  tablet in the morning and half a tablet at bedtime.  She reports that she has most of her symptoms at bedtime.  And often wakes up around 4 AM with symptoms.  She continues to take Provigil 200 mg during the day.  On occasion she will have to take a second dose at lunchtime.  Although she reports that this is rare.    04/17/20: Amy Burnett is a 72 year old female with a history of  obstructive sleep apnea on CPAP and restless leg syndrome.  She returns today for follow-up.  Her download indicates that she use her machine 26 out of 30 days for compliance of 87%.  She is averaging greater than 4 hours 19 days for compliance of 63%.  On average she uses her machine 4 hours and 32 minutes.  Her residual AHI is 4.5 on 8 cm of water with EPR of 3.  She does not have a significant leak.  Continues on Provigil and feel that is helpful for daytime sleepiness  She continues to use Mirapex for restless legs.  And feels that it works well for her.  HISTORY 10/16/19:   Amy Burnett is a 72 year old female with a history of obstructive sleep apnea on CPAP and restless leg syndrome.  She is currently on Mirapex 0.5 mg at bedtime.  She reports that this has been beneficial.  She states that when she gets to sleep she has not woken up with discomfort.  She states that she has noticed that after lunch she began having some discomfort if she sits down.  She still has Neupro patches that she will use if she has breakthrough symptoms.  She returns today for an evaluation.  REVIEW  OF SYSTEMS: Out of a complete 14 system review of symptoms, the patient complains only of the following symptoms, and all other reviewed systems are negative.   ESS 8  ALLERGIES: No Known Allergies  HOME MEDICATIONS: Outpatient Medications Prior to Visit  Medication Sig Dispense Refill   albuterol (VENTOLIN HFA) 108 (90 Base) MCG/ACT inhaler Inhale into the lungs as needed.      ALPRAZolam (XANAX) 0.5 MG tablet Take 0.5 mg by mouth as needed.      Cyanocobalamin (B-12 PO) Take by mouth daily.     hydrochlorothiazide (HYDRODIURIL) 25 MG tablet Take 25 mg by mouth every morning.  0   modafinil (PROVIGIL) 200 MG tablet TAKE 1 TABLET BY MOUTH AT 7AM AND 1 TABLET AT 12 NOON, OR NO LATER THAN 2PM 60 tablet 5   pramipexole (MIRAPEX) 0.5 MG tablet TAKE 1/2 TABLET BY MOUTH MID AFTERNOON AND 1 TABLET AT BEDTIME 135 tablet 0    sertraline (ZOLOFT) 100 MG tablet Take 200 mg by mouth every morning.      VITAMIN D PO Take by mouth daily.     No facility-administered medications prior to visit.    PAST MEDICAL HISTORY: Past Medical History:  Diagnosis Date   Anxiety    Excessive daytime sleepiness    Heart murmur    History of kidney stones    History of panic attacks    Hx of adenomatous polyp of colon 01/03/2017   Hypertension    OSA on CPAP    Osteopenia, senile 2017   hip   Rectal lipoma 2016   RLS (restless legs syndrome)    Sleep apnea     PAST SURGICAL HISTORY: Past Surgical History:  Procedure Laterality Date   Achilles Tendinitis     CARDIOVASCULAR STRESS TEST  09-19-2009   normal perfusion study/  ef 80%   COLONOSCOPY  03/09/2006   D & C HYSTEROSCOPY W/ RESECTION MYOMA  01-28-2009   D & C HYSTEROSCOPY/  RESECTION MYOMA AND ENDOMETRIAL POLYP  09-23-2009   EUS N/A 06/01/2013   Procedure: LOWER ENDOSCOPIC ULTRASOUND (EUS);  Surgeon: Rachael Fee, MD;  Location: [facility];  Service: Endoscopy;  Laterality: N/A;   EUS N/A 12/06/2014   Procedure: LOWER ENDOSCOPIC ULTRASOUND (EUS);  Surgeon: Rachael Fee, MD;  Location: [facility];  Service: Endoscopy;  Laterality: N/A;   LEFT URETEROSCOPIC LASER LITHOTRIPSY STONE EXTRACTION/ STENT PLACEMENT  12-13-2001   TONSILLECTOMY AND ADENOIDECTOMY  age 84   TRANSANAL EXCISION OF RECTAL MASS N/A 04/11/2015   Procedure: TRANSANAL EXCISION OF DISTAL RECTAL MASS;  Surgeon: Romie Levee, MD;  Location: [facility];  Service: General;  Laterality: N/A;   TRANSTHORACIC ECHOCARDIOGRAM  09-16-2009   mild LVH,  ef 60-65%,  grade I diastolic dysfunction/  trivial MR and TR    FAMILY HISTORY: Family History  Problem Relation Age of Onset   Breast cancer Mother 69   Pancreatic cancer Mother 12   Heart disease Father    Other Sister        pituitary tumor   Colon cancer Neg Hx    Esophageal cancer Neg Hx    Rectal cancer Neg Hx     Stomach cancer Neg Hx    Sleep apnea Neg Hx     SOCIAL HISTORY: Social History   Socioeconomic History   Marital status: Married    Spouse name: Tom   Number of children: 2   Years of education: college   Highest education level:  Not on file  Occupational History   Occupation: retired    Associate Professor: SELF-EMPLOYED    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  Vaping Use   Vaping status: Never Used  Substance and Sexual Activity   Alcohol use: Yes    Alcohol/week: 2.0 standard drinks of alcohol    Types: 2 Glasses of wine per week    Comment: with dinner   Drug use: No   Sexual activity: Not Currently    Partners: Male    Birth control/protection: Post-menopausal  Other Topics Concern   Not on file  Social History Narrative   Patient lives at home with her husband Elijah Birk)    Patient works full time   Education- college   Right handed   Caffeine- diet coke three to 4 daily   1 son 1 daughter   Former smoker   1 alcoholic beverage daily   Social Determinants of Corporate investment banker Strain: Low Risk  (07/12/2022)   Received from [employer], [facility]   Overall Financial Resource Strain (CARDIA)    Difficulty of Paying Living Expenses: Not hard at all  Food Insecurity: No Food Insecurity (07/12/2022)   Received from [facility], [facility]   Hunger Vital Sign    Worried About Running Out of Food in the Last Year: Never true    Ran Out of Food in the Last Year: Never true  Transportation Needs: No Transportation Needs (07/12/2022)   Received from [facility], [facility]   PRAPARE - Transportation    Lack of Transportation (Medical): No    Lack of Transportation (Non-Medical): No  Physical Activity: Insufficiently  Active (07/12/2022)   Received from [facility], [facility]   Exercise Vital Sign    Days of Exercise per Week: 3 days    Minutes of Exercise per Session: 30 min  Stress: No Stress Concern Present (07/12/2022)   Received from [facility], [facility] - Occupational Stress Questionnaire    Feeling of Stress : Not at all  Social Connections: Socially Integrated (07/12/2022)   Received from [facility], [facility]   Social Network    How would you rate your social network (family, work, friends)?: Good participation with social networks  Intimate Partner Violence: Not At Risk (07/12/2022)   Received from [facility], [facility]   HITS    Over the last 12 months how often did your partner physically hurt you?: Never    Over the last 12 months how often did your partner insult you or talk down to you?: Never    Over the last 12 months how often did your partner threaten you with physical harm?: Never    Over the last 12 months how often did your partner scream or curse at you?: Never      PHYSICAL EXAM  Vitals:   04/26/23 0947  BP: 127/75  Pulse: 82  Weight: 229 lb (103.9 kg)  Height: 5\' 6"  (1.676 m)    Body mass index is 36.96 kg/m.  Generalized: Well developed, in no acute distress    Neurological examination  Mentation: Alert oriented to time,  place, history taking. Follows all commands speech and language fluent Cranial nerve II-XII: Extraocular movements were full, visual field were full on confrontational test Head turning and shoulder shrug  were normal and symmetric.   DIAGNOSTIC DATA (LABS, IMAGING, TESTING) - I reviewed patient records, labs, notes, testing and imaging myself where available.  Lab Results  Component Value Date   WBC 7.0 09/23/2009   HGB 13.5 04/24/2015   HCT 42.3 09/23/2009   MCV 94.2 09/23/2009   PLT 167 09/23/2009      Component Value Date/Time   NA 142 09/18/2009 0807   K 4.3  09/18/2009 0807   CL 111 09/18/2009 0807   CO2 26 09/18/2009 0807   GLUCOSE 102 (H) 09/18/2009 0807   BUN 13 09/18/2009 0807   CREATININE 0.8 09/18/2009 0807   CALCIUM 9.1 09/18/2009 0807   PROT 6.8 09/18/2009 0807   ALBUMIN 4.1 09/18/2009 0807   AST 20 09/18/2009 0807   ALT 18 09/18/2009 0807   ALKPHOS 85 09/18/2009 0807   BILITOT 0.2 (L) 09/18/2009 0807   GFRNONAA 78.08 09/18/2009 0807   GFRAA 111 08/21/2008 0906   Lab Results  Component Value Date   CHOL 213 (H) 09/18/2009   HDL 70.40 09/18/2009   LDLDIRECT 138.8 09/18/2009   TRIG 72.0 09/18/2009   CHOLHDL 3 09/18/2009    Lab Results  Component Value Date   TSH 1.14 09/18/2009      ASSESSMENT AND PLAN 72 y.o. year old female  has a past medical history of Anxiety, Excessive daytime sleepiness, Heart murmur, History of kidney stones, History of panic attacks, adenomatous polyp of colon (01/03/2017), Hypertension, OSA on CPAP, Osteopenia, senile (2017), Rectal lipoma (2016), RLS (restless legs syndrome), and Sleep apnea. here with:  OSA on CPAP  - CPAP compliance excellent - Good treatment of AHI  - Encourage patient to use CPAP nightly and > 4 hours each night  2.  Daytime sleepiness  -Continue Provigil 200 mg daily okay to take second dose at lunchtime if needed.  Advised that he can slowly try reducing her dose.  Okay to take 200 mg in the morning, if second dose is needed try taking half a tablet.  3.  Restless leg syndrome  -Continue Mirapex 0.5 mg half a tablet midafternoon and 1 full tablet at bedtime.   Follow-up in 1 year or sooner if needed     Butch Penny, MSN, NP-C 04/26/2023, 9:58 AM [facility] [address] [place], [place] [address] [phone]

## 2023-04-26 ENCOUNTER — Ambulatory Visit: Payer: Medicare Other | Admitting: Adult Health

## 2023-04-26 ENCOUNTER — Encounter: Payer: Self-pay | Admitting: Adult Health

## 2023-04-26 VITALS — BP 127/75 | HR 82 | Ht 66.0 in | Wt 229.0 lb

## 2023-04-26 DIAGNOSIS — G4733 Obstructive sleep apnea (adult) (pediatric): Secondary | ICD-10-CM | POA: Diagnosis not present

## 2023-04-26 DIAGNOSIS — R400 Somnolence: Secondary | ICD-10-CM | POA: Diagnosis not present

## 2023-04-26 DIAGNOSIS — G2581 Restless legs syndrome: Secondary | ICD-10-CM

## 2023-04-26 MED ORDER — PRAMIPEXOLE DIHYDROCHLORIDE 0.5 MG PO TABS: ORAL_TABLET | ORAL | 3 refills | Status: DC

## 2023-10-07 ENCOUNTER — Other Ambulatory Visit: Payer: Self-pay | Admitting: Adult Health

## 2023-10-07 DIAGNOSIS — G2581 Restless legs syndrome: Secondary | ICD-10-CM

## 2023-10-07 DIAGNOSIS — D509 Iron deficiency anemia, unspecified: Secondary | ICD-10-CM

## 2023-10-07 DIAGNOSIS — G4701 Insomnia due to medical condition: Secondary | ICD-10-CM

## 2023-10-12 NOTE — Telephone Encounter (Signed)
 Last visit 04/26/23 Next visit 05/02/24 Last fills:   Last prescription from 03/24/23 only good for 6 months.

## 2023-10-25 ENCOUNTER — Other Ambulatory Visit (HOSPITAL_BASED_OUTPATIENT_CLINIC_OR_DEPARTMENT_OTHER): Payer: Self-pay

## 2023-10-25 MED ORDER — ZEPBOUND 2.5 MG/0.5ML ~~LOC~~ SOAJ
2.5000 mg | SUBCUTANEOUS | 0 refills | Status: DC
  Filled 2023-10-25 – 2023-10-26 (×2): qty 2, 28d supply, fill #0

## 2023-10-26 ENCOUNTER — Other Ambulatory Visit (HOSPITAL_BASED_OUTPATIENT_CLINIC_OR_DEPARTMENT_OTHER): Payer: Self-pay

## 2023-11-11 ENCOUNTER — Encounter: Payer: Medicare Other | Admitting: Obstetrics and Gynecology

## 2023-11-17 NOTE — Progress Notes (Signed)
 73 y.o. G36P2002 Married Caucasian female here for a breast and pelvic exam.    No GYN concerns.   The patient is also followed for osteopenia and accidental leakage of stool.  Bowel function is under better control.  She has learned better what her triggers are.  She has done pelvic floor therapy.   Going to [employer] and enjoying her grandchildren.   PCP: Jearlean Mince, PA-C   Patient's last menstrual period was 01/04/2012.           Sexually active: No.  The current method of family planning is post menopausal status.    Menopausal hormone therapy:  n/a Exercising: Yes.    5x weekly water aerobics  Smoker:  Former  OB History     Gravida  2   Para  2   Term  2   Preterm      AB      Living  2      SAB      IAB      Ectopic      Multiple      Live Births              HEALTH MAINTENANCE: Last 2 paps: 06/17/21 neg, 11/04/18 neg  History of abnormal Pap or positive HPV:  no Mammogram:  11/30/22 Breast Density Cat C, BIRADS Cat 1 neg  Colonoscopy:  12/24/16 - was due in 2023. Dr. Willy Harvest.  Bone Density:  02/10/22  Result  osteopenic/low bone mass.  FRAX 9.6%/1/5%   Immunization History  Administered Date(s) Administered   Influenza, High Dose Seasonal PF 05/11/2018, 02/09/2019   Influenza, Quadrivalent, Recombinant, Inj, Pf 04/30/2016   Influenza, Seasonal, Injecte, Preservative Fre 03/17/2012, 04/16/2014, 04/08/2015, 04/30/2016, 04/05/2017, 05/11/2018   Influenza,inj,Quad PF,6+ Mos 04/08/2015   Influenza-Unspecified 03/17/2012, 04/16/2014, 04/08/2015, 04/05/2017, 05/11/2018   PFIZER(Purple Top)SARS-COV-2 Vaccination 07/04/2019, 07/24/2019, 02/28/2020, 11/20/2020   Pfizer Covid-19 Vaccine Bivalent Booster 62yrs & up 02/23/2021   Pneumococcal Conjugate-13 07/13/2016   Pneumococcal Polysaccharide-23 11/21/2018   Td 08/28/2008   Tdap 06/15/2006   Zoster, Live 06/15/2010      reports that she quit smoking about 38 years ago. Her smoking use  included cigarettes. She started smoking about 53 years ago. She has never used smokeless tobacco. She reports current alcohol use of about 2.0 standard drinks of alcohol per week. She reports that she does not use drugs.  Past Medical History:  Diagnosis Date   Anxiety    Excessive daytime sleepiness    Heart murmur    History of kidney stones    History of panic attacks    Hx of adenomatous polyp of colon 01/03/2017   Hypertension    OSA on CPAP    Osteopenia, senile 2017   hip   Rectal lipoma 2016   RLS (restless legs syndrome)    Sleep apnea     Past Surgical History:  Procedure Laterality Date   Achilles Tendinitis     CARDIOVASCULAR STRESS TEST  09-19-2009   normal perfusion study/  ef 80%   COLONOSCOPY  03/09/2006   D & C HYSTEROSCOPY W/ RESECTION MYOMA  01-28-2009   D & C HYSTEROSCOPY/  RESECTION MYOMA AND ENDOMETRIAL POLYP  09-23-2009   EUS N/A 06/01/2013   Procedure: LOWER ENDOSCOPIC ULTRASOUND (EUS);  Surgeon: Janel Medford, MD;  Location: [facility];  Service: Endoscopy;  Laterality: N/A;   EUS N/A 12/06/2014   Procedure: LOWER ENDOSCOPIC ULTRASOUND (EUS);  Surgeon: Janel Medford, MD;  Location: [facility];  Service: Endoscopy;  Laterality: N/A;   LEFT URETEROSCOPIC LASER LITHOTRIPSY STONE EXTRACTION/ STENT PLACEMENT  12-13-2001   TONSILLECTOMY AND ADENOIDECTOMY  age 75   TRANSANAL EXCISION OF RECTAL MASS N/A 04/11/2015   Procedure: TRANSANAL EXCISION OF DISTAL RECTAL MASS;  Surgeon: Joyce Nixon, MD;  Location: [facility];  Service: General;  Laterality: N/A;   TRANSTHORACIC ECHOCARDIOGRAM  09-16-2009   mild LVH,  ef 60-65%,  grade I diastolic dysfunction/  trivial MR and TR    Current Outpatient Medications  Medication Sig Dispense Refill   albuterol (VENTOLIN HFA) 108 (90 Base) MCG/ACT inhaler Inhale into the lungs as needed.      ALPRAZolam (XANAX) 0.5 MG tablet Take 0.5 mg by mouth as needed.      Cyanocobalamin (B-12 PO) Take by mouth  daily.     hydrochlorothiazide (HYDRODIURIL) 25 MG tablet Take 25 mg by mouth every morning.  0   modafinil  (PROVIGIL ) 200 MG tablet TAKE 1 TABLET BY MOUTH AT 7AM AND 1 TABLET AT 12 NOON, OR NO LATER THAN 2PM 60 tablet 5   pramipexole  (MIRAPEX ) 0.5 MG tablet TAKE 1/2 TABLET BY MOUTH MID AFTERNOON AND 1 TABLET AT BEDTIME 135 tablet 3   REPATHA SURECLICK 140 MG/ML SOAJ Inject into the skin.     sertraline (ZOLOFT) 100 MG tablet Take 200 mg by mouth every morning.      tirzepatide  (ZEPBOUND ) 2.5 MG/0.5ML Pen Inject 2.5 mg into the skin once a week. 2 mL 0   VITAMIN D  PO Take by mouth daily.     No current facility-administered medications for this visit.    ALLERGIES: Atorvastatin, Pravastatin, and Rosuvastatin  Family History  Problem Relation Age of Onset   Breast cancer Mother 37   Pancreatic cancer Mother 33   Heart disease Father    Other Sister        pituitary tumor   Colon cancer Neg Hx    Esophageal cancer Neg Hx    Rectal cancer Neg Hx    Stomach cancer Neg Hx    Sleep apnea Neg Hx     Review of Systems  All other systems reviewed and are negative.   PHYSICAL EXAM:  BP 116/82 (BP Location: Left Arm, Patient Position: Sitting)   Pulse 95   Ht 5\' 6"  (1.676 m)   Wt 213 lb (96.6 kg)   LMP 01/04/2012   SpO2 97%   BMI 34.38 kg/m     General appearance: alert, cooperative and appears stated age Head: normocephalic, without obvious abnormality, atraumatic Neck: no adenopathy, supple, symmetrical, trachea midline and thyroid  normal to inspection and palpation Lungs: clear to auscultation bilaterally Breasts: normal appearance, no masses or tenderness, No nipple retraction or dimpling, No nipple discharge or bleeding, No axillary adenopathy Heart: regular rate and rhythm Abdomen: soft, non-tender; no masses, no organomegaly Extremities: extremities normal, atraumatic, no cyanosis or edema Skin: skin color, texture, turgor normal. No rashes or lesions Lymph nodes:  cervical, supraclavicular, and axillary nodes normal. Neurologic: grossly normal  Pelvic: External genitalia:  no lesions              No abnormal inguinal nodes palpated.              Urethra:  normal appearing urethra with no masses, tenderness or lesions              Bartholins and Skenes: normal  Vagina: normal appearing vagina with normal color and discharge, no lesions              Cervix: no lesions              Pap taken: yes Bimanual Exam:  Uterus:  normal size, contour, position, consistency, mobility, non-tender              Adnexa: no mass, fullness, tenderness              Rectal exam: yes.  Confirms.              Anus:  normal sphincter tone, no lesions  Chaperone was present for exam:  Jonetta Nest, CMA  ASSESSMENT: Encounter for breast and pelvic exam.  Cervical cancer screening. Menopause.  Osteopenia.  Hx of breast and pancreatic cancer in mother.  Hx renal stones.  HTN.  Hx fecal incontinence.  Colon cancer screening.   PLAN: Mammogram screening discussed. Self breast awareness reviewed. Pap and HRV collected:  Yes.   Guidelines for Calcium, Vitamin D , regular exercise program including cardiovascular and weight bearing exercise. Medication refills:  NA Referral for genetic counseling/testing.  Referral for colonoscopy.  Labs with PCP.  Bone density ordered.  Follow up:  2 years and prn.    Additional counseling given.  yes. 20 min  total time was spent for this patient encounter, including preparation, face-to-face counseling with the patient, coordination of care, and documentation of the encounter in addition to doing the breast and pelvic exam.

## 2023-11-18 ENCOUNTER — Encounter: Payer: Self-pay | Admitting: Obstetrics and Gynecology

## 2023-11-18 ENCOUNTER — Other Ambulatory Visit (HOSPITAL_COMMUNITY)
Admission: RE | Admit: 2023-11-18 | Discharge: 2023-11-18 | Disposition: A | Discharge status: Home / Self Care | Source: Ambulatory Visit | Attending: Obstetrics and Gynecology | Admitting: Obstetrics and Gynecology

## 2023-11-18 ENCOUNTER — Ambulatory Visit: Admitting: Obstetrics and Gynecology

## 2023-11-18 VITALS — BP 116/82 | HR 95 | Ht 66.0 in | Wt 213.0 lb

## 2023-11-18 DIAGNOSIS — M8580 Other specified disorders of bone density and structure, unspecified site: Secondary | ICD-10-CM | POA: Diagnosis not present

## 2023-11-18 DIAGNOSIS — Z124 Encounter for screening for malignant neoplasm of cervix: Secondary | ICD-10-CM

## 2023-11-18 DIAGNOSIS — Z1211 Encounter for screening for malignant neoplasm of colon: Secondary | ICD-10-CM

## 2023-11-18 DIAGNOSIS — Z1151 Encounter for screening for human papillomavirus (HPV): Secondary | ICD-10-CM | POA: Insufficient documentation

## 2023-11-18 DIAGNOSIS — Z01419 Encounter for gynecological examination (general) (routine) without abnormal findings: Secondary | ICD-10-CM | POA: Diagnosis not present

## 2023-11-18 DIAGNOSIS — Z780 Asymptomatic menopausal state: Secondary | ICD-10-CM | POA: Diagnosis not present

## 2023-11-18 DIAGNOSIS — Z803 Family history of malignant neoplasm of breast: Secondary | ICD-10-CM

## 2023-11-18 DIAGNOSIS — Z800 Family history of malignant neoplasm of digestive organs: Secondary | ICD-10-CM

## 2023-11-18 DIAGNOSIS — R151 Fecal smearing: Secondary | ICD-10-CM

## 2023-11-18 NOTE — Patient Instructions (Signed)

## 2023-11-22 ENCOUNTER — Other Ambulatory Visit (HOSPITAL_BASED_OUTPATIENT_CLINIC_OR_DEPARTMENT_OTHER): Payer: Self-pay

## 2023-11-22 ENCOUNTER — Ambulatory Visit: Payer: Self-pay | Admitting: Obstetrics and Gynecology

## 2023-11-22 LAB — CYTOLOGY - PAP
Comment: NEGATIVE
Diagnosis: NEGATIVE
High risk HPV: NEGATIVE

## 2023-11-22 MED ORDER — ZEPBOUND 2.5 MG/0.5ML ~~LOC~~ SOAJ
2.5000 mg | SUBCUTANEOUS | 0 refills | Status: AC
  Filled 2023-11-22: qty 2, 28d supply, fill #0

## 2023-11-25 ENCOUNTER — Other Ambulatory Visit (HOSPITAL_BASED_OUTPATIENT_CLINIC_OR_DEPARTMENT_OTHER): Payer: Self-pay

## 2023-12-07 ENCOUNTER — Other Ambulatory Visit (HOSPITAL_BASED_OUTPATIENT_CLINIC_OR_DEPARTMENT_OTHER): Payer: Self-pay

## 2023-12-07 MED ORDER — ZEPBOUND 5 MG/0.5ML ~~LOC~~ SOAJ
5.0000 mg | SUBCUTANEOUS | 0 refills | Status: AC
  Filled 2023-12-07: qty 2, 28d supply, fill #0

## 2024-01-11 ENCOUNTER — Other Ambulatory Visit (HOSPITAL_BASED_OUTPATIENT_CLINIC_OR_DEPARTMENT_OTHER): Payer: Self-pay

## 2024-01-11 MED ORDER — ZEPBOUND 7.5 MG/0.5ML ~~LOC~~ SOAJ
7.5000 mg | SUBCUTANEOUS | 0 refills | Status: AC
  Filled 2024-01-11: qty 2, 28d supply, fill #0

## 2024-01-13 ENCOUNTER — Other Ambulatory Visit (HOSPITAL_BASED_OUTPATIENT_CLINIC_OR_DEPARTMENT_OTHER): Payer: Self-pay

## 2024-01-17 ENCOUNTER — Other Ambulatory Visit (HOSPITAL_BASED_OUTPATIENT_CLINIC_OR_DEPARTMENT_OTHER): Payer: Self-pay

## 2024-01-17 MED ORDER — ZEPBOUND 10 MG/0.5ML ~~LOC~~ SOAJ
10.0000 mg | SUBCUTANEOUS | 0 refills | Status: DC
  Filled 2024-01-17: qty 2, 28d supply, fill #0

## 2024-01-18 ENCOUNTER — Other Ambulatory Visit: Payer: Self-pay

## 2024-02-21 ENCOUNTER — Other Ambulatory Visit (HOSPITAL_BASED_OUTPATIENT_CLINIC_OR_DEPARTMENT_OTHER): Payer: Self-pay

## 2024-02-21 MED ORDER — ZEPBOUND 12.5 MG/0.5ML ~~LOC~~ SOAJ
12.5000 mg | SUBCUTANEOUS | 0 refills | Status: DC
  Filled 2024-02-21: qty 2, 28d supply, fill #0

## 2024-03-06 ENCOUNTER — Encounter: Payer: Self-pay | Admitting: Internal Medicine

## 2024-03-07 ENCOUNTER — Other Ambulatory Visit: Payer: Self-pay | Admitting: Licensed Clinical Social Worker

## 2024-03-07 ENCOUNTER — Encounter: Payer: Self-pay | Admitting: Licensed Clinical Social Worker

## 2024-03-07 ENCOUNTER — Inpatient Hospital Stay: Attending: Oncology | Admitting: Licensed Clinical Social Worker

## 2024-03-07 DIAGNOSIS — Z1379 Encounter for other screening for genetic and chromosomal anomalies: Secondary | ICD-10-CM | POA: Diagnosis present

## 2024-03-07 DIAGNOSIS — Z803 Family history of malignant neoplasm of breast: Secondary | ICD-10-CM | POA: Insufficient documentation

## 2024-03-07 DIAGNOSIS — Z800 Family history of malignant neoplasm of digestive organs: Secondary | ICD-10-CM | POA: Diagnosis not present

## 2024-03-07 NOTE — Progress Notes (Signed)
 REFERRING PROVIDER: Cathlyn JAYSON Nikki Bobie FORBES, MD [address] [place],  [place] [address]  PRIMARY PROVIDER:  Jacques Camie Pepper, PA-C  PRIMARY REASON FOR VISIT:  1. Family history of breast cancer   2. Family history of pancreatic cancer    I connected with Ms. Rohm on 03/07/2024 at 1:00 PM EDT by telephone and verified that I am speaking with the correct person using three identifiers.    Patient location: home Provider location: [facility]  HISTORY OF PRESENT ILLNESS:   Ms. Guisinger, a 73 y.o. female, was seen for a [facility] cancer genetics consultation at the request of Dr. Cathlyn JAYSON Nikki due to a family history of cancer.  Ms. Sedberry presents to clinic today to discuss the possibility of a hereditary predisposition to cancer, genetic testing, and to further clarify her future cancer risks, as well as potential cancer risks for family members.   CANCER HISTORY:  Ms. Subia is a 73 y.o. female with no personal history of cancer.    RELEVANT MEDICAL HISTORY:  Ovaries intact: yes.  Hysterectomy: no.  Menopausal status: postmenopausal.  Colonoscopy: yes; reports few polyps. Mammogram within the last year: yes. Number of breast biopsies: 0.  Past Medical History:  Diagnosis Date   Anxiety    Excessive daytime sleepiness    Heart murmur    History of kidney stones    History of panic attacks    Hx of adenomatous polyp of colon 01/03/2017   Hypertension    OSA on CPAP    Osteopenia, senile 2017   hip   Rectal lipoma 2016   RLS (restless legs syndrome)    Sleep apnea     Past Surgical History:  Procedure Laterality Date   Achilles Tendinitis     CARDIOVASCULAR STRESS TEST  09-19-2009   normal perfusion study/  ef 80%   COLONOSCOPY  03/09/2006   D & C HYSTEROSCOPY W/ RESECTION MYOMA  01-28-2009   D & C HYSTEROSCOPY/  RESECTION MYOMA AND ENDOMETRIAL POLYP  09-23-2009   EUS N/A 06/01/2013   Procedure: LOWER ENDOSCOPIC ULTRASOUND (EUS);   Surgeon: Toribio SHAUNNA Cedar, MD;  Location: [facility];  Service: Endoscopy;  Laterality: N/A;   EUS N/A 12/06/2014   Procedure: LOWER ENDOSCOPIC ULTRASOUND (EUS);  Surgeon: Toribio SHAUNNA Cedar, MD;  Location: [facility];  Service: Endoscopy;  Laterality: N/A;   LEFT URETEROSCOPIC LASER LITHOTRIPSY STONE EXTRACTION/ STENT PLACEMENT  12-13-2001   TONSILLECTOMY AND ADENOIDECTOMY  age 66   TRANSANAL EXCISION OF RECTAL MASS N/A 04/11/2015   Procedure: TRANSANAL EXCISION OF DISTAL RECTAL MASS;  Surgeon: Bernarda Ned, MD;  Location: [facility];  Service: General;  Laterality: N/A;   TRANSTHORACIC ECHOCARDIOGRAM  09-16-2009   mild LVH,  ef 60-65%,  grade I diastolic dysfunction/  trivial MR and TR    FAMILY HISTORY:  We obtained a detailed, 4-generation family history.  Significant diagnoses are listed below: Family History  Problem Relation Age of Onset   Breast cancer Mother 56   Pancreatic cancer Mother 18   Heart disease Father    Other Sister        pituitary tumor   Colon cancer Neg Hx    Esophageal cancer Neg Hx    Rectal cancer Neg Hx    Stomach cancer Neg Hx    Sleep apnea Neg Hx    Ms. Anagnos has 1 daughter, 74 and 1 son , 19. She has 1 sister, 68, no cancers.  Ms. Elgin mother had breast cancer at 50 and pancreatic cancer at 26. No other known cancers in the family.  Ms. Roan is unaware of previous family history of genetic testing for hereditary cancer risks. There is no reported Ashkenazi Jewish ancestry. There is no known consanguinity.    GENETIC COUNSELING ASSESSMENT: Ms. Kosar is a 73 y.o. female with a family history of breast and pancreatic cancer in her mother which is somewhat suggestive of a hereditary cancer syndrome and predisposition to cancer. We, therefore, discussed and recommended the following at today's visit.   DISCUSSION: We discussed that approximately 10% of breast/pancreatic cancer is hereditary. Most cases of hereditary  breast/pancreatic cancer are associated with BRCA1/BRCA2 genes, although there are other genes associated with hereditary cancer as well. Cancers and risks are gene specific. We discussed that testing is beneficial for several reasons including knowing about cancer risks, identifying potential screening and risk-reduction options that may be appropriate, and to understand if other family members could be at risk for cancer and allow them to undergo genetic testing.   We reviewed the characteristics, features and inheritance patterns of hereditary cancer syndromes. We also discussed genetic testing, including the appropriate family members to test, the process of testing, insurance coverage and turn-around-time for results. We discussed the implications of a negative, positive and/or variant of uncertain significant result. We recommended Ms. Devries pursue genetic testing for the [facility] CancerNext-Expanded+RNA gene panel.   The CancerNext-Expanded gene panel offered by [facility] and includes sequencing, rearrangement, and RNA analysis for the following 77 genes: AIP, ALK, APC, ATM, AXIN2, BAP1, BARD1, BMPR1A, BRCA1, BRCA2, BRIP1, CDC73, CDH1, CDK4, CDKN1B, CDKN2A, CEBPA, CHEK2, CTNNA1, DDX41, DICER1, ETV6, FH, FLCN, GATA2, LZTR1, MAX, MBD4, MEN1, MET, MLH1, MSH2, MSH3, MSH6, MUTYH, NF1, NF2, NTHL1, PALB2, PHOX2B, PMS2, POT1, PRKAR1A, PTCH1, PTEN, RAD51C, RAD51D, RB1, RET, RPS20, RUNX1, SDHA, SDHAF2, SDHB, SDHC, SDHD, SMAD4, SMARCA4, SMARCB1, SMARCE1, STK11, SUFU, TMEM127, TP53, TSC1, TSC2, VHL, and WT1 (sequencing and deletion/duplication); EGFR, HOXB13, KIT, MITF, PDGFRA, POLD1, and POLE (sequencing only); EPCAM and GREM1 (deletion/duplication only).   Based on Ms. Recine's family history of cancer, she meets medical criteria for genetic testing. Despite that she meets criteria, she may still have an out of pocket cost.  PLAN: After considering the risks, benefits, and limitations, Ms. Kohlman provided  informed consent to pursue genetic testing and the blood sample was sent to [facility] for analysis of the CancerNext-Expanded+RNA Panel. Results should be available within approximately 2-3 weeks' time, at which point they will be disclosed by telephone to Ms. Spinner, as will any additional recommendations warranted by these results. Ms. Nowaczyk will receive a summary of her genetic counseling visit and a copy of her results once available. This information will also be available in Epic.   Ms. Loser questions were answered to her satisfaction today. Our contact information was provided should additional questions or concerns arise. Thank you for the referral and allowing us  to share in the care of your patient.   Dena Cary, MS, [facility] Genetic Counselor [email or web address] Phone: [phone]  35 minutes were spent on the date of the encounter in service to the patient including preparation, virtual consultation, documentation and care coordination. Dr. Delinda was available for discussion regarding this case.   _______________________________________________________________________ For Office Staff:  Number of people involved in session: 1 Was an Intern/ student involved with case: no

## 2024-03-10 ENCOUNTER — Other Ambulatory Visit

## 2024-03-10 DIAGNOSIS — Z1379 Encounter for other screening for genetic and chromosomal anomalies: Secondary | ICD-10-CM

## 2024-03-10 LAB — GENETIC SCREENING ORDER

## 2024-03-23 ENCOUNTER — Other Ambulatory Visit: Payer: Self-pay | Admitting: Obstetrics and Gynecology

## 2024-03-23 DIAGNOSIS — Z1231 Encounter for screening mammogram for malignant neoplasm of breast: Secondary | ICD-10-CM

## 2024-03-27 ENCOUNTER — Telehealth: Payer: Self-pay | Admitting: Licensed Clinical Social Worker

## 2024-03-29 ENCOUNTER — Other Ambulatory Visit (HOSPITAL_BASED_OUTPATIENT_CLINIC_OR_DEPARTMENT_OTHER): Payer: Self-pay

## 2024-03-29 MED ORDER — ZEPBOUND 15 MG/0.5ML ~~LOC~~ SOAJ
15.0000 mg | SUBCUTANEOUS | 0 refills | Status: AC
  Filled 2024-03-29: qty 2, 28d supply, fill #0

## 2024-04-03 ENCOUNTER — Ambulatory Visit: Admitting: Family Medicine

## 2024-04-03 VITALS — BP 130/61 | HR 88 | Ht 66.0 in | Wt 200.0 lb

## 2024-04-03 DIAGNOSIS — G8929 Other chronic pain: Secondary | ICD-10-CM | POA: Diagnosis not present

## 2024-04-03 DIAGNOSIS — M25511 Pain in right shoulder: Secondary | ICD-10-CM

## 2024-04-03 NOTE — Progress Notes (Signed)
 PCP: Jacques Camie Pepper, PA-C  HPI:  Patient is a 73 y/o F here with right shoulder pain.  Patient reports about a year ago she developed bilateral shoulder pain worse on the right. No acute injury. Has bothered more at night. Has been seen by orthopedic surgery and had 2 subacromial steroid injections without lasting relief. 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  Full thickness near full width infraspinatus tear with 1cm retraction as well. Was recommended to have surgical intervention.  Past Medical History:  Diagnosis Date   Anxiety    Excessive daytime sleepiness    Heart murmur    History of kidney stones    History of panic attacks    Hx of adenomatous polyp of colon 01/03/2017   Hypertension    OSA on CPAP    Osteopenia, senile 2017   hip   Rectal lipoma 2016   RLS (restless legs syndrome)    Sleep apnea     Current Outpatient Medications on File Prior to Visit  Medication Sig Dispense Refill   albuterol (VENTOLIN HFA) 108 (90 Base) MCG/ACT inhaler Inhale into the lungs as needed.      ALPRAZolam (XANAX) 0.5 MG tablet Take 0.5 mg by mouth as needed.      Cyanocobalamin (B-12 PO) Take by mouth daily.     hydrochlorothiazide (HYDRODIURIL) 25 MG tablet Take 25 mg by mouth every morning.  0   modafinil  (PROVIGIL ) 200 MG tablet TAKE 1 TABLET BY MOUTH AT 7AM AND 1 TABLET AT 12 NOON, OR NO LATER THAN 2PM 60 tablet 5   pramipexole  (MIRAPEX ) 0.5 MG tablet TAKE 1/2 TABLET BY MOUTH MID AFTERNOON AND 1 TABLET AT BEDTIME 135 tablet 3   REPATHA SURECLICK 140 MG/ML SOAJ Inject into the skin.     sertraline (ZOLOFT) 100 MG tablet Take 200 mg by mouth every morning.      tirzepatide  (ZEPBOUND ) 15 MG/0.5ML Pen Inject 15 mg into the skin every 7 (seven) days. 6 mL 0   tirzepatide  (ZEPBOUND ) 2.5 MG/0.5ML Pen Inject 2.5 mg into the skin once a week. 2 mL 0   tirzepatide   (ZEPBOUND ) 5 MG/0.5ML Pen Inject 5 mg into the skin once a week in the abdomen, thigh, or upper arm. 2 mL 0   tirzepatide  (ZEPBOUND ) 7.5 MG/0.5ML Pen Inject 7.5 mg into the skin every 7 (seven) days. 2 mL 0   VITAMIN D  PO Take by mouth daily.     No current facility-administered medications on file prior to visit.    Past Surgical History:  Procedure Laterality Date   Achilles Tendinitis     CARDIOVASCULAR STRESS TEST  09-19-2009   normal perfusion study/  ef 80%   COLONOSCOPY  03/09/2006   D & C HYSTEROSCOPY W/ RESECTION MYOMA  01-28-2009   D & C HYSTEROSCOPY/  RESECTION MYOMA AND ENDOMETRIAL POLYP  09-23-2009   EUS N/A 06/01/2013   Procedure: LOWER ENDOSCOPIC ULTRASOUND (EUS);  Surgeon: Toribio SHAUNNA Cedar, MD;  Location: [facility];  Service: Endoscopy;  Laterality: N/A;   EUS N/A 12/06/2014   Procedure: LOWER ENDOSCOPIC ULTRASOUND (EUS);  Surgeon: Toribio SHAUNNA Cedar, MD;  Location: [facility];  Service: Endoscopy;  Laterality: N/A;   LEFT URETEROSCOPIC LASER LITHOTRIPSY STONE EXTRACTION/ STENT PLACEMENT  12-13-2001   TONSILLECTOMY AND ADENOIDECTOMY  age 3   TRANSANAL EXCISION OF RECTAL MASS N/A 04/11/2015   Procedure: TRANSANAL EXCISION  OF DISTAL RECTAL MASS;  Surgeon: Bernarda Ned, MD;  Location: [facility];  Service: General;  Laterality: N/A;   TRANSTHORACIC ECHOCARDIOGRAM  09-16-2009   mild LVH,  ef 60-65%,  grade I diastolic dysfunction/  trivial MR and TR    Allergies  Allergen Reactions   Atorvastatin Other (See Comments)   Pravastatin Other (See Comments)   Rosuvastatin Other (See Comments)    BP 130/61   Pulse 88   Ht 5' 6 (1.676 m)   Wt 200 lb (90.7 kg)   LMP 01/04/2012   BMI 32.28 kg/m       No data to display              No data to display              Objective:  Physical Exam:  Gen: NAD, comfortable in exam room  Right shoulder: No swelling, ecchymoses.  No gross deformity. Mild TTP AC joint, lateral shoulder. FROM but  cannot abduct to get in full overhead position, has to flex to do so. Strength 3/5 with empty can resisted external rotation.  5/5 internal rotation. Negative apprehension. NV intact distally.   Assessment and Plan Right shoulder pain - with notable weakness.  MRI showed full thickness full width supraspinatus tear and full width nearly full thickness tear of infraspinatus.  Of some concern is she already has some mild atrophy of supraspinatus which may make repair difficult.  However advised surgical intervention is the best approach as soon as possible - hopefully can be repaired and not require reverse total shoulder.  She will follow up with ortho.

## 2024-04-06 ENCOUNTER — Ambulatory Visit: Payer: Self-pay | Admitting: Licensed Clinical Social Worker

## 2024-04-06 DIAGNOSIS — Z1379 Encounter for other screening for genetic and chromosomal anomalies: Secondary | ICD-10-CM | POA: Insufficient documentation

## 2024-04-06 NOTE — Progress Notes (Signed)
 HPI:   Ms. Nordell was previously seen in the [facility] due to a family history of cancer and concerns regarding a hereditary predisposition to cancer. Please refer to our prior cancer genetics clinic note for more information regarding our discussion, assessment and recommendations, at the time. Ms. Deeg recent genetic test results were disclosed to her, as were recommendations warranted by these results. These results and recommendations are discussed in more detail below.  CANCER HISTORY:  Oncology History   No history exists.    FAMILY HISTORY:  We obtained a detailed, 4-generation family history.  Significant diagnoses are listed below: Family History  Problem Relation Age of Onset   Breast cancer Mother 39   Pancreatic cancer Mother 31   Heart disease Father    Other Sister        pituitary tumor   Colon cancer Neg Hx    Esophageal cancer Neg Hx    Rectal cancer Neg Hx    Stomach cancer Neg Hx    Sleep apnea Neg Hx    Ms. Viscomi has 1 daughter, 52 and 1 son , 63. She has 1 sister, 58, no cancers.   Ms. Geron mother had breast cancer at 71 and pancreatic cancer at 65. No other known cancers in the family.   Ms. Mealy is unaware of previous family history of genetic testing for hereditary cancer risks. There is no reported Ashkenazi Jewish ancestry. There is no known consanguinity.      GENETIC TEST RESULTS:  The [facility] CancerNext-Expanded+RNA Panel found no pathogenic mutations.   The CancerNext-Expanded gene panel offered by [facility] and includes sequencing, rearrangement, and RNA analysis for the following 77 genes: AIP, ALK, APC, ATM, AXIN2, BAP1, BARD1, BMPR1A, BRCA1, BRCA2, BRIP1, CDC73, CDH1, CDK4, CDKN1B, CDKN2A, CEBPA, CHEK2, CTNNA1, DDX41, DICER1, ETV6, FH, FLCN, GATA2, LZTR1, MAX, MBD4, MEN1, MET, MLH1, MSH2, MSH3, MSH6, MUTYH, NF1, NF2, NTHL1, PALB2, PHOX2B, PMS2, POT1, PRKAR1A, PTCH1, PTEN, RAD51C, RAD51D, RB1, RET, RPS20, RUNX1,  SDHA, SDHAF2, SDHB, SDHC, SDHD, SMAD4, SMARCA4, SMARCB1, SMARCE1, STK11, SUFU, TMEM127, TP53, TSC1, TSC2, VHL, and WT1 (sequencing and deletion/duplication); EGFR, HOXB13, KIT, MITF, PDGFRA, POLD1, and POLE (sequencing only); EPCAM and GREM1 (deletion/duplication only).   The test report has been scanned into EPIC and is located under the Molecular Pathology section of the Results Review tab.  A portion of the result report is included below for reference. Genetic testing reported out on 03/24/2024.       Even though a pathogenic variant was not identified, possible explanations for the cancer in the family may include: There may be no hereditary risk for cancer in the family. The cancers in Ms. Iten's family may be sporadic/familial or due to other genetic and environmental factors. There may be a gene mutation in one of these genes that current testing methods cannot detect but that chance is small. There could be another gene that has not yet been discovered, or that we have not yet tested, that is responsible for the cancer diagnoses in the family.  It is also possible there is a hereditary cause for the cancer in the family that Ms. Lei did not inherit.  Therefore, it is important to remain in touch with cancer genetics in the future so that we can continue to offer Ms. Marlatt the most up to date genetic testing.   ADDITIONAL GENETIC TESTING:  We discussed with Ms. Merkley that her genetic testing was fairly extensive.  If there are additional relevant  genes identified to increase cancer risk that can be analyzed in the future, we would be happy to discuss and coordinate this testing at that time.   CANCER SCREENING RECOMMENDATIONS:  Ms. Schum test result is considered negative (normal).  This means that we have not identified a hereditary cause for her family history of cancer at this time.   An individual's cancer risk and medical management are not determined by genetic test results  alone. Overall cancer risk assessment incorporates additional factors, including personal medical history, family history, and any available genetic information that may result in a personalized plan for cancer prevention and surveillance. Therefore, it is recommended she continue to follow the cancer management and screening guidelines provided by her primary healthcare provider.  RECOMMENDATIONS FOR FAMILY MEMBERS:   Since she did not inherit a identifiable mutation in a cancer predisposition gene included on this panel, her children could not have inherited a known mutation from her in one of these genes. Individuals in this family might be at some increased risk of developing cancer, over the general population risk, due to the family history of cancer.  Individuals in the family should notify their providers of the family history of cancer. We recommend women in this family have a yearly mammogram beginning at age 70, or 83 years younger than the earliest onset of cancer, an annual clinical breast exam, and perform monthly breast self-exams.  Family members should have colonoscopies by at age 12, or earlier, as recommended by their providers. Other members of the family may still carry a pathogenic variant in one of these genes that Ms. Lemaire did not inherit. Based on the family history, we recommend her maternal relatives (including her sister) have genetic counseling and testing. Ms. Rayos will let us  know if we can be of any assistance in coordinating genetic counseling and/or testing for this family member.     FOLLOW-UP:  Lastly, we discussed with Ms. Palau that cancer genetics is a rapidly advancing field and it is possible that new genetic tests will be appropriate for her and/or her family members in the future. We encouraged her to remain in contact with cancer genetics on an annual basis so we can update her personal and family histories and let her know of advances in cancer genetics that  may benefit this family.   Our contact number was provided. Ms. Carranco questions were answered to her satisfaction, and she knows she is welcome to call us  at anytime with additional questions or concerns.    Dena Cary, MS, [facility] Genetic Counselor [email or web address] Phone: [phone]

## 2024-04-06 NOTE — Telephone Encounter (Signed)
 I contacted [name] to discuss her genetic testing results. No pathogenic variants were identified in the 77 genes analyzed. Detailed clinic note to follow.   The test report has been scanned into EPIC and is located under the Molecular Pathology section of the Results Review tab.  A portion of the result report is included below for reference.      Dena Cary, MS, [facility] Genetic Counselor [email or web address] Phone: [phone]

## 2024-05-02 ENCOUNTER — Telehealth: Payer: Medicare Other | Admitting: Adult Health

## 2024-05-02 ENCOUNTER — Telehealth: Admitting: Adult Health

## 2024-05-02 ENCOUNTER — Ambulatory Visit
Admission: RE | Admit: 2024-05-02 | Discharge: 2024-05-02 | Disposition: A | Discharge status: Home / Self Care | Source: Ambulatory Visit | Attending: Obstetrics and Gynecology | Admitting: Obstetrics and Gynecology

## 2024-05-02 DIAGNOSIS — G4733 Obstructive sleep apnea (adult) (pediatric): Secondary | ICD-10-CM | POA: Diagnosis not present

## 2024-05-02 DIAGNOSIS — G2581 Restless legs syndrome: Secondary | ICD-10-CM | POA: Diagnosis not present

## 2024-05-02 DIAGNOSIS — R400 Somnolence: Secondary | ICD-10-CM | POA: Diagnosis not present

## 2024-05-02 DIAGNOSIS — Z1231 Encounter for screening mammogram for malignant neoplasm of breast: Secondary | ICD-10-CM

## 2024-05-02 MED ORDER — PRAMIPEXOLE DIHYDROCHLORIDE 0.5 MG PO TABS: ORAL_TABLET | ORAL | 3 refills | Status: AC

## 2024-05-02 NOTE — Progress Notes (Signed)
 PATIENT: Amy Burnett Lat DOB: 1951-01-31  REASON FOR VISIT: follow up HISTORY FROM: patient PRIMARY NEUROLOGIST:   Virtual Visit via Video Note  I connected with Amy Burnett Lat on 05/02/24 at  2:45 PM EST by a video enabled telemedicine application located remotely at [facility] and verified that I am speaking with the correct person using two identifiers who was located at their own home in [place].    I discussed the limitations of evaluation and management by telemedicine and the availability of in person appointments. The patient expressed understanding and agreed to proceed.   PATIENT: Amy Burnett Lat DOB: 03-11-1951  REASON FOR VISIT: follow up HISTORY FROM: patient  HISTORY OF PRESENT ILLNESS: Today 05/02/24  Amy Burnett is a 73 y.o. female with a history of OSA on CPAP and RLS. Returns today for follow-up.  She reports that the CPAP is working well for her.  She currently wears the nasal pillows.  Denies any issues with her machine or the mask.  In regards to restless leg she reports that Mirapex  continues to be beneficial.  Takes half a tablet mid afternoon and 1 full tablet at bedtime.  In the past she has been on Provigil  but reports that she reduced her dose to half a tablet and did not see a difference. she is now off the medication and reports that her sleepiness has been under relatively good control.  Her CPAP report is below      HISTORY 04/26/23:   Amy Burnett is a 73 y.o. female with a history of OSA on CPAP. Returns today for follow-up.  She reports that CPAP Is working well.  She denies any new issues.  She continues to take Provigil  during the day.  States that she is not as sleepy as she used to be.  She has not tried reducing her dose.  Continues Mirapex  for restless leg and that works well for her.  Takes half a tablet in the afternoon and 1 full tablet at bedtime    REVIEW OF SYSTEMS: Out of a complete 14 system review of symptoms, the  patient complains only of the following symptoms, and all other reviewed systems are negative.  ALLERGIES: Allergies  Allergen Reactions   Atorvastatin Other (See Comments)   Pravastatin Other (See Comments)   Rosuvastatin Other (See Comments)    HOME MEDICATIONS: Outpatient Medications Prior to Visit  Medication Sig Dispense Refill   albuterol (VENTOLIN HFA) 108 (90 Base) MCG/ACT inhaler Inhale into the lungs as needed.      ALPRAZolam (XANAX) 0.5 MG tablet Take 0.5 mg by mouth as needed.      Cyanocobalamin (B-12 PO) Take by mouth daily.     hydrochlorothiazide (HYDRODIURIL) 25 MG tablet Take 25 mg by mouth every morning.  0   REPATHA SURECLICK 140 MG/ML SOAJ Inject into the skin.     sertraline (ZOLOFT) 100 MG tablet Take 200 mg by mouth every morning.      tirzepatide  (ZEPBOUND ) 15 MG/0.5ML Pen Inject 15 mg into the skin every 7 (seven) days. 6 mL 0   tirzepatide  (ZEPBOUND ) 2.5 MG/0.5ML Pen Inject 2.5 mg into the skin once a week. 2 mL 0   tirzepatide  (ZEPBOUND ) 5 MG/0.5ML Pen Inject 5 mg into the skin once a week in the abdomen, thigh, or upper arm. 2 mL 0   tirzepatide  (ZEPBOUND ) 7.5 MG/0.5ML Pen Inject 7.5 mg into the skin every 7 (seven) days. 2 mL 0  VITAMIN D  PO Take by mouth daily.     modafinil  (PROVIGIL ) 200 MG tablet TAKE 1 TABLET BY MOUTH AT 7AM AND 1 TABLET AT 12 NOON, OR NO LATER THAN 2PM 60 tablet 5   pramipexole  (MIRAPEX ) 0.5 MG tablet TAKE 1/2 TABLET BY MOUTH MID AFTERNOON AND 1 TABLET AT BEDTIME 135 tablet 3   No facility-administered medications prior to visit.    PAST MEDICAL HISTORY: Past Medical History:  Diagnosis Date   Anxiety    Excessive daytime sleepiness    Heart murmur    History of kidney stones    History of panic attacks    Hx of adenomatous polyp of colon 01/03/2017   Hypertension    OSA on CPAP    Osteopenia, senile 2017   hip   Rectal lipoma 2016   RLS (restless legs syndrome)    Sleep apnea     PAST SURGICAL HISTORY: Past  Surgical History:  Procedure Laterality Date   Achilles Tendinitis     CARDIOVASCULAR STRESS TEST  09-19-2009   normal perfusion study/  ef 80%   COLONOSCOPY  03/09/2006   D & C HYSTEROSCOPY W/ RESECTION MYOMA  01-28-2009   D & C HYSTEROSCOPY/  RESECTION MYOMA AND ENDOMETRIAL POLYP  09-23-2009   EUS N/A 06/01/2013   Procedure: LOWER ENDOSCOPIC ULTRASOUND (EUS);  Surgeon: Amy SHAUNNA Cedar, MD;  Location: [facility];  Service: Endoscopy;  Laterality: N/A;   EUS N/A 12/06/2014   Procedure: LOWER ENDOSCOPIC ULTRASOUND (EUS);  Surgeon: Amy SHAUNNA Cedar, MD;  Location: [facility];  Service: Endoscopy;  Laterality: N/A;   LEFT URETEROSCOPIC LASER LITHOTRIPSY STONE EXTRACTION/ STENT PLACEMENT  12-13-2001   TONSILLECTOMY AND ADENOIDECTOMY  age 61   TRANSANAL EXCISION OF RECTAL MASS N/A 04/11/2015   Procedure: TRANSANAL EXCISION OF DISTAL RECTAL MASS;  Surgeon: Amy Ned, MD;  Location: [facility];  Service: General;  Laterality: N/A;   TRANSTHORACIC ECHOCARDIOGRAM  09-16-2009   mild LVH,  ef 60-65%,  grade I diastolic dysfunction/  trivial MR and TR    FAMILY HISTORY: Family History  Problem Relation Age of Onset   Breast cancer Mother 67   Pancreatic cancer Mother 94   Heart disease Father    Other Sister        pituitary tumor   Colon cancer Neg Hx    Esophageal cancer Neg Hx    Rectal cancer Neg Hx    Stomach cancer Neg Hx    Sleep apnea Neg Hx     SOCIAL HISTORY: Social History   Socioeconomic History   Marital status: Married    Spouse name: Tom   Number of children: 2   Years of education: college   Highest education level: Not on file  Occupational History   Occupation: retired    Associate Professor: SELF-EMPLOYED    Comment: full time works for her self  Tobacco Use   Smoking status: Former    Current packs/day: 0.00    Types: Cigarettes    Start date: 06/15/1970    Quit date: 06/15/1985    Years since quitting: 38.9   Smokeless tobacco: Never   Tobacco  comments:    Quit 32 years ago.  Vaping Use   Vaping status: Never Used  Substance and Sexual Activity   Alcohol use: Yes    Alcohol/week: 2.0 standard drinks of alcohol    Types: 2 Glasses of wine per week    Comment: occ with dinner   Drug use: No  Sexual activity: Not Currently    Partners: Male    Birth control/protection: Post-menopausal    Comment: Less than 5, after 16, no std, no des  Other Topics Concern   Not on file  Social History Narrative   Patient lives at home with her husband Phyllistine)    Patient works full time   Education- college   Right handed   Caffeine- diet coke three to 4 daily   1 son 1 daughter   Former smoker   1 alcoholic beverage daily   Social Drivers of Corporate Investment Banker Strain: Low Risk  (10/18/2023)   Received from [facility]   Overall Financial Resource Strain (CARDIA)    Difficulty of Paying Living Expenses: Not hard at all  Food Insecurity: No Food Insecurity (10/18/2023)   [facility]   Hunger Vital Sign    Within the past 12 months, you worried that your food would run out before you got the money to buy more.: Never true    Within the past 12 months, the food you bought just didn't last and you didn't have money to get more.: Never true  Transportation Needs: No Transportation Needs (10/18/2023)   Received from [facility] - Transportation    Lack of Transportation (Medical): No    Lack of Transportation (Non-Medical): No  Physical Activity: Sufficiently Active (10/18/2023)   Received from [facility]   Exercise Vital Sign    On average, how many days per week do you engage in moderate to strenuous exercise (like a brisk walk)?: 5 days    On average, how many minutes do you engage in exercise at this level?: 60 min  Stress: No Stress Concern Present (10/18/2023)   Received from [facility] - Occupational Stress Questionnaire    Feeling of Stress : Not at  all  Social Connections: Socially Integrated (10/18/2023)   Received from [facility]   Social Network    How would you rate your social network (family, work, friends)?: Good participation with social networks  Intimate Partner Violence: Not At Risk (10/18/2023)   Received from [facility]   HITS    Over the last 12 months how often did your partner physically hurt you?: Never    Over the last 12 months how often did your partner insult you or talk down to you?: Never    Over the last 12 months how often did your partner threaten you with physical harm?: Never    Over the last 12 months how often did your partner scream or curse at you?: Never      PHYSICAL EXAM Generalized: Well developed, in no acute distress   Neurological examination  Mentation: Alert oriented to time, place, history taking. Follows all commands speech and language fluent Cranial nerve II-XII: Facial symmetry noted.   DIAGNOSTIC DATA (LABS, IMAGING, TESTING) - I reviewed patient records, labs, notes, testing and imaging myself where available.  Lab Results  Component Value Date   WBC 7.0 09/23/2009   HGB 13.5 04/24/2015   HCT 42.3 09/23/2009   MCV 94.2 09/23/2009   PLT 167 09/23/2009      Component Value Date/Time   NA 142 09/18/2009 0807   K 4.3 09/18/2009 0807   CL 111 09/18/2009 0807   CO2 26 09/18/2009 0807   GLUCOSE 102 (H) 09/18/2009 0807   BUN 13 09/18/2009 0807   CREATININE 0.8 09/18/2009 9192  CALCIUM 9.1 09/18/2009 0807   PROT 6.8 09/18/2009 0807   ALBUMIN 4.1 09/18/2009 0807   AST 20 09/18/2009 0807   ALT 18 09/18/2009 0807   ALKPHOS 85 09/18/2009 0807   BILITOT 0.2 (L) 09/18/2009 0807   GFRNONAA 78.08 09/18/2009 0807   GFRAA 111 08/21/2008 0906   Lab Results  Component Value Date   CHOL 213 (H) 09/18/2009   HDL 70.40 09/18/2009   LDLDIRECT 138.8 09/18/2009   TRIG 72.0 09/18/2009   CHOLHDL 3 09/18/2009   No results found for: HGBA1C No results found for:  VITAMINB12 Lab Results  Component Value Date   TSH 1.14 09/18/2009      ASSESSMENT AND PLAN 73 y.o. year old female  has a past medical history of Anxiety, Excessive daytime sleepiness, Heart murmur, History of kidney stones, History of panic attacks, adenomatous polyp of colon (01/03/2017), Hypertension, OSA on CPAP, Osteopenia, senile (2017), Rectal lipoma (2016), RLS (restless legs syndrome), and Sleep apnea. here with:  OSA on CPAP   - CPAP compliance excellent - Good treatment of AHI  - Encourage patient to use CPAP nightly and > 4 hours each night   2.  Daytime sleepiness   -Improved - no longer on provigil , will continue to monitor   3.  Restless leg syndrome   -Continue Mirapex  0.5 mg half a tablet midafternoon and 1 full tablet at bedtime.      Meds ordered this encounter  Medications   pramipexole  (MIRAPEX ) 0.5 MG tablet    Sig: TAKE 1/2 TABLET BY MOUTH MID AFTERNOON AND 1 TABLET AT BEDTIME    Dispense:  135 tablet    Refill:  3    Supervising Provider:   YAN, YIJUN [[id number]]    Duwaine Russell, MSN, NP-C 05/02/2024, 2:42 PM [facility] [address] [place], [place] [address] [phone]  The patient's condition requires frequent monitoring and adjustments in the treatment plan, reflecting the ongoing complexity of care.  This provider is the continuing focal point for all needed services for this condition.

## 2024-05-02 NOTE — Patient Instructions (Signed)
 Continue using CPAP nightly and greater than 4 hours each night Continue Mirapex  If your symptoms worsen or you develop new symptoms please let us  know.

## 2024-05-04 ENCOUNTER — Other Ambulatory Visit: Payer: Self-pay | Admitting: Obstetrics and Gynecology

## 2024-05-04 ENCOUNTER — Ambulatory Visit: Payer: Self-pay | Admitting: Obstetrics and Gynecology

## 2024-05-04 DIAGNOSIS — R928 Other abnormal and inconclusive findings on diagnostic imaging of breast: Secondary | ICD-10-CM

## 2024-05-17 ENCOUNTER — Other Ambulatory Visit: Payer: Self-pay

## 2024-05-17 ENCOUNTER — Other Ambulatory Visit (HOSPITAL_BASED_OUTPATIENT_CLINIC_OR_DEPARTMENT_OTHER): Payer: Self-pay

## 2024-05-17 MED ORDER — ZEPBOUND 15 MG/0.5ML ~~LOC~~ SOAJ
15.0000 mg | SUBCUTANEOUS | 0 refills | Status: AC
  Filled 2024-05-17: qty 2, 28d supply, fill #0
  Filled 2024-06-16: qty 2, 28d supply, fill #1

## 2024-05-22 ENCOUNTER — Ambulatory Visit: Payer: Self-pay | Admitting: Obstetrics and Gynecology

## 2024-05-22 ENCOUNTER — Ambulatory Visit
Admission: RE | Admit: 2024-05-22 | Discharge: 2024-05-22 | Disposition: A | Source: Ambulatory Visit | Attending: Obstetrics and Gynecology | Admitting: Obstetrics and Gynecology

## 2024-05-22 ENCOUNTER — Ambulatory Visit

## 2024-05-22 DIAGNOSIS — R928 Other abnormal and inconclusive findings on diagnostic imaging of breast: Secondary | ICD-10-CM

## 2024-05-23 ENCOUNTER — Telehealth: Payer: Self-pay

## 2024-05-23 NOTE — Telephone Encounter (Signed)
 RN called to schedule colonoscopy-recall.  Colonscopy scheduled on 07/31/2024 at 8:30 Am with Dr. Avram.  Pre-visit scheduled on 06/26/2024 at 9:00 AM- RM 53  Pt verbalizes understanding. Letter sent via Mychart and mail.

## 2024-06-16 ENCOUNTER — Other Ambulatory Visit: Payer: Self-pay

## 2024-06-16 ENCOUNTER — Other Ambulatory Visit (HOSPITAL_BASED_OUTPATIENT_CLINIC_OR_DEPARTMENT_OTHER): Payer: Self-pay

## 2024-06-22 ENCOUNTER — Other Ambulatory Visit (HOSPITAL_BASED_OUTPATIENT_CLINIC_OR_DEPARTMENT_OTHER): Payer: Self-pay

## 2024-06-26 ENCOUNTER — Encounter

## 2024-06-26 ENCOUNTER — Telehealth: Payer: Self-pay

## 2024-06-26 NOTE — Telephone Encounter (Signed)
 Patient did not call back to reschedule pre-visit. RN to cancel pre-visit and procedure on 07/31/2024 with Dr. Avram.  Patient notified via letter and MyChart.

## 2024-06-26 NOTE — Telephone Encounter (Signed)
 RN attempted to call patient, NO answer. LVM informing pt RN will call back in 10 minutes. [facility] number left to call back if she is available.

## 2024-06-26 NOTE — Telephone Encounter (Signed)
 Patient no show/answer PV appointment for today at 9:00 AM via phone call. RN called patient a second time, no answer. Left a message for the patient to call us  back today before 4:30 PM to reschedule PV or the PV and procedure will be cancelled.   No show letter will be sent to patient via mail and my chart.

## 2024-07-10 ENCOUNTER — Other Ambulatory Visit (HOSPITAL_BASED_OUTPATIENT_CLINIC_OR_DEPARTMENT_OTHER): Payer: Self-pay

## 2024-07-10 MED ORDER — ZEPBOUND 15 MG/0.5ML ~~LOC~~ SOAJ
15.0000 mg | SUBCUTANEOUS | 0 refills | Status: AC
  Filled 2024-07-10 – 2024-07-14 (×3): qty 2, 28d supply, fill #0

## 2024-07-14 ENCOUNTER — Other Ambulatory Visit (HOSPITAL_BASED_OUTPATIENT_CLINIC_OR_DEPARTMENT_OTHER): Payer: Self-pay

## 2024-07-14 MED ORDER — ZEPBOUND 15 MG/0.5ML ~~LOC~~ SOAJ
15.0000 mg | SUBCUTANEOUS | 0 refills | Status: DC
  Filled 2024-07-14: qty 2, 28d supply, fill #0

## 2024-07-18 ENCOUNTER — Encounter: Payer: Self-pay | Admitting: Internal Medicine

## 2024-07-18 ENCOUNTER — Other Ambulatory Visit (HOSPITAL_BASED_OUTPATIENT_CLINIC_OR_DEPARTMENT_OTHER): Payer: Self-pay

## 2024-07-19 ENCOUNTER — Other Ambulatory Visit (HOSPITAL_BASED_OUTPATIENT_CLINIC_OR_DEPARTMENT_OTHER): Payer: Self-pay

## 2024-07-31 ENCOUNTER — Encounter: Admitting: Internal Medicine

## 2024-08-24 ENCOUNTER — Encounter

## 2024-09-07 ENCOUNTER — Encounter: Admitting: Internal Medicine

## 2025-05-01 ENCOUNTER — Telehealth: Admitting: Adult Health
